# Patient Record
Sex: Male | Born: 1990 | Race: White | Hispanic: No | Marital: Married | Smoking: Never smoker
Health system: Southern US, Community
[De-identification: ages and names within clinical notes are randomized; demographics above are authoritative.]

## PROBLEM LIST (undated history)

## (undated) ENCOUNTER — Emergency Department (HOSPITAL_COMMUNITY)
Discharge status: Against Medical Advice | Payer: Self-pay | Attending: Emergency Medicine | Admitting: Emergency Medicine

## (undated) DIAGNOSIS — Z9889 Other specified postprocedural states: Secondary | ICD-10-CM

## (undated) DIAGNOSIS — Z87442 Personal history of urinary calculi: Secondary | ICD-10-CM

## (undated) DIAGNOSIS — K219 Gastro-esophageal reflux disease without esophagitis: Secondary | ICD-10-CM

## (undated) DIAGNOSIS — N200 Calculus of kidney: Secondary | ICD-10-CM

## (undated) HISTORY — PX: TYMPANOSTOMY TUBE PLACEMENT: SHX32

## (undated) HISTORY — PX: ADENOIDECTOMY: SUR15

## (undated) HISTORY — PX: ANTERIOR CRUCIATE LIGAMENT REPAIR: SHX115

---
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
## 2010-03-22 NOTE — Letter (Signed)
Summary: *Orthopedic No Show Letter  [facility]  [address]  [place], [place] [address]   Phone: [phone]  Fax: [phone]      06/28/2009   Chad Weaver c/o [address] [place], [place]  [address]     Dear Chad Weaver,   Our records indicate that you missed your scheduled appointment with Dr. Beaulah Corin on Monday, 06/26/09.  Please contact this office to reschedule your appointment as soon as possible.  It is important that you keep your scheduled appointments with your physician, so we can provide you the best care possible.        Sincerely,    Dr. Terrance Mass, MD [facility] Phone [phone]
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
## 2010-11-12 LAB — STREP A DNA PROBE

## 2010-11-12 LAB — INFLUENZA A+B VIRUS AG-DIRECT(RAPID): Influenza B Ag: NEGATIVE

## 2010-11-28 LAB — HEMOGLOBIN AND HEMATOCRIT, BLOOD: Hemoglobin: 15.7 — ABNORMAL HIGH

## 2011-01-31 ENCOUNTER — Emergency Department (HOSPITAL_COMMUNITY)
Admission: EM | Admit: 2011-01-31 | Discharge: 2011-01-31 | Disposition: A | Discharge status: Home / Self Care | Payer: Self-pay | Attending: Emergency Medicine | Admitting: Emergency Medicine

## 2011-01-31 ENCOUNTER — Encounter: Payer: Self-pay | Admitting: Emergency Medicine

## 2011-01-31 DIAGNOSIS — M109 Gout, unspecified: Secondary | ICD-10-CM | POA: Insufficient documentation

## 2011-01-31 MED ORDER — PREDNISONE 20 MG PO TABS
60.0000 mg | ORAL_TABLET | Freq: Once | ORAL | Status: AC
  Administered 2011-01-31: 60 mg via ORAL
  Filled 2011-01-31: qty 3

## 2011-01-31 MED ORDER — PREDNISONE 50 MG PO TABS: ORAL_TABLET | ORAL | Status: AC

## 2011-01-31 MED ORDER — HYDROCODONE-ACETAMINOPHEN 5-325 MG PO TABS: ORAL_TABLET | ORAL | Status: DC

## 2011-01-31 MED ORDER — HYDROCODONE-ACETAMINOPHEN 5-325 MG PO TABS
1.0000 | ORAL_TABLET | Freq: Once | ORAL | Status: AC
  Administered 2011-01-31: 1 via ORAL
  Filled 2011-01-31: qty 1

## 2011-01-31 NOTE — ED Notes (Signed)
Pt c/o left foot pain/swelling. H/s gout.

## 2011-01-31 NOTE — ED Provider Notes (Signed)
History     CSN: [id number] Arrival date & time: 01/31/2011 12:10 PM   First MD Initiated Contact with Patient 01/31/11 1422      Chief Complaint  Patient presents with  . Gout    (Consider location/radiation/quality/duration/timing/severity/associated sxs/prior treatment) HPI Comments: Pain in R foot for ~ 4 days.  Has had several episodes of gout previously.    No foot trauma.  No fever.  The history is provided by the patient. No language interpreter was used.    Past Medical History  Diagnosis Date  . Gout     Past Surgical History  Procedure Date  . Anterior cruciate ligament repair     No family history on file.  History  Substance Use Topics  . Smoking status: Never Smoker   . Smokeless tobacco: Not on file  . Alcohol Use: No      Review of Systems  Constitutional: Negative for fever.  Musculoskeletal:       Foot pain  All other systems reviewed and are negative.    Allergies  Sulfonamide derivatives  Home Medications  No current outpatient prescriptions on file.  BP 142/83  Pulse 87  Temp(Src) 97.6 F (36.4 C) (Oral)  Resp 20  Ht 5\' 10"  (1.778 m)  Wt 240 lb (108.863 kg)  BMI 34.44 kg/m2  SpO2 100%  Physical Exam  Nursing note and vitals reviewed. Constitutional: He is oriented to person, place, and time. He appears well-developed and well-nourished.  HENT:  Head: Normocephalic and atraumatic.  Eyes: EOM are normal.  Neck: Normal range of motion.  Cardiovascular: Normal rate, regular rhythm, normal heart sounds and intact distal pulses.   Pulmonary/Chest: Effort normal and breath sounds normal. No respiratory distress.  Abdominal: Soft. He exhibits no distension. There is no tenderness.  Musculoskeletal: He exhibits tenderness.       Right foot: He exhibits decreased range of motion, tenderness and swelling. He exhibits no bony tenderness, normal capillary refill, no crepitus, no deformity and no laceration.        Feet:  Neurological: He is alert and oriented to person, place, and time.  Skin: Skin is warm and dry.  Psychiatric: He has a normal mood and affect. Judgment normal.    ED Course  Procedures (including critical care time)  Labs Reviewed - No data to display No results found.   No diagnosis found.    MDM          Worthy Rancher, PA 01/31/11 1435

## 2011-01-31 NOTE — ED Provider Notes (Signed)
Medical screening examination/treatment/procedure(s) were performed by non-physician practitioner and as supervising physician I was immediately available for consultation/collaboration.   Benny Lennert, MD 01/31/11 [phone]

## 2011-07-06 ENCOUNTER — Encounter (HOSPITAL_COMMUNITY): Payer: Self-pay | Admitting: Emergency Medicine

## 2011-07-06 ENCOUNTER — Emergency Department (HOSPITAL_COMMUNITY): Payer: Self-pay

## 2011-07-06 ENCOUNTER — Emergency Department (HOSPITAL_COMMUNITY)
Admission: EM | Admit: 2011-07-06 | Discharge: 2011-07-06 | Disposition: A | Discharge status: Home / Self Care | Payer: Self-pay | Attending: Emergency Medicine | Admitting: Emergency Medicine

## 2011-07-06 DIAGNOSIS — Z862 Personal history of diseases of the blood and blood-forming organs and certain disorders involving the immune mechanism: Secondary | ICD-10-CM | POA: Insufficient documentation

## 2011-07-06 DIAGNOSIS — N200 Calculus of kidney: Secondary | ICD-10-CM

## 2011-07-06 DIAGNOSIS — R1032 Left lower quadrant pain: Secondary | ICD-10-CM | POA: Insufficient documentation

## 2011-07-06 DIAGNOSIS — N201 Calculus of ureter: Secondary | ICD-10-CM | POA: Insufficient documentation

## 2011-07-06 DIAGNOSIS — R112 Nausea with vomiting, unspecified: Secondary | ICD-10-CM | POA: Insufficient documentation

## 2011-07-06 DIAGNOSIS — N1330 Unspecified hydronephrosis: Secondary | ICD-10-CM | POA: Insufficient documentation

## 2011-07-06 DIAGNOSIS — Z79899 Other long term (current) drug therapy: Secondary | ICD-10-CM | POA: Insufficient documentation

## 2011-07-06 DIAGNOSIS — R61 Generalized hyperhidrosis: Secondary | ICD-10-CM | POA: Insufficient documentation

## 2011-07-06 DIAGNOSIS — Z8639 Personal history of other endocrine, nutritional and metabolic disease: Secondary | ICD-10-CM | POA: Insufficient documentation

## 2011-07-06 LAB — URINALYSIS, ROUTINE W REFLEX MICROSCOPIC
Ketones, ur: NEGATIVE mg/dL
Leukocytes, UA: NEGATIVE
Nitrite: NEGATIVE
Protein, ur: NEGATIVE mg/dL
Urobilinogen, UA: 0.2 mg/dL (ref 0.0–1.0)
pH: 5.5 (ref 5.0–8.0)

## 2011-07-06 LAB — BASIC METABOLIC PANEL
BUN: 16 mg/dL (ref 6–23)
CO2: 25 mEq/L (ref 19–32)
Calcium: 9.1 mg/dL (ref 8.4–10.5)
Creatinine, Ser: 1.05 mg/dL (ref 0.50–1.35)
GFR calc Af Amer: >90 mL/min (ref >90)
GFR calc non Af Amer: >90 mL/min (ref >90)

## 2011-07-06 MED ORDER — OXYCODONE-ACETAMINOPHEN 5-325 MG PO TABS: 1.0000 | ORAL_TABLET | ORAL | Status: AC | PRN

## 2011-07-06 MED ORDER — HYDROMORPHONE HCL PF 1 MG/ML IJ SOLN
1.0000 mg | Freq: Once | INTRAMUSCULAR | Status: AC
  Administered 2011-07-06: 1 mg via INTRAVENOUS

## 2011-07-06 MED ORDER — KETOROLAC TROMETHAMINE 30 MG/ML IJ SOLN
30.0000 mg | Freq: Once | INTRAMUSCULAR | Status: AC
  Administered 2011-07-06: 30 mg via INTRAVENOUS
  Filled 2011-07-06: qty 1

## 2011-07-06 MED ORDER — HYDROMORPHONE HCL PF 1 MG/ML IJ SOLN
1.0000 mg | Freq: Once | INTRAMUSCULAR | Status: DC
  Filled 2011-07-06: qty 1

## 2011-07-06 MED ORDER — PROMETHAZINE HCL 25 MG PO TABS: 25.0000 mg | ORAL_TABLET | Freq: Four times a day (QID) | ORAL | Status: DC | PRN

## 2011-07-06 MED ORDER — ONDANSETRON HCL 4 MG/2ML IJ SOLN
4.0000 mg | Freq: Once | INTRAMUSCULAR | Status: AC
  Administered 2011-07-06: 4 mg via INTRAVENOUS
  Filled 2011-07-06: qty 2

## 2011-07-06 MED ORDER — CEPHALEXIN 500 MG PO CAPS: 500.0000 mg | ORAL_CAPSULE | Freq: Four times a day (QID) | ORAL | Status: AC

## 2011-07-06 NOTE — ED Notes (Signed)
Pt returned from xray,  

## 2011-07-06 NOTE — Discharge Instructions (Signed)
Kidney Stones Kidney stones (ureteral lithiasis) are deposits that form inside your kidneys. The intense pain is caused by the stone moving through the urinary tract. When the stone moves, the ureter goes into spasm around the stone. The stone is usually passed in the urine.  CAUSES   A disorder that makes certain neck glands produce too much parathyroid hormone (primary hyperparathyroidism).   A buildup of uric acid crystals.   Narrowing (stricture) of the ureter.   A kidney obstruction present at birth (congenital obstruction).   Previous surgery on the kidney or ureters.   Numerous kidney infections.  SYMPTOMS   Feeling sick to your stomach (nauseous).   Throwing up (vomiting).   Blood in the urine (hematuria).   Pain that usually spreads (radiates) to the groin.   Frequency or urgency of urination.  DIAGNOSIS   Taking a history and physical exam.   Blood or urine tests.   Computerized X-ray scan (CT scan).   Occasionally, an examination of the inside of the urinary bladder (cystoscopy) is performed.  TREATMENT   Observation.   Increasing your fluid intake.   Surgery may be needed if you have severe pain or persistent obstruction.  The size, location, and chemical composition are all important variables that will determine the proper choice of action for you. Talk to your caregiver to better understand your situation so that you will minimize the risk of injury to yourself and your kidney.  HOME CARE INSTRUCTIONS   Drink enough water and fluids to keep your urine clear or pale yellow.   Strain all urine through the provided strainer. Keep all particulate matter and stones for your caregiver to see. The stone causing the pain may be as small as a grain of salt. It is very important to use the strainer each and every time you pass your urine. The collection of your stone will allow your caregiver to analyze it and verify that a stone has actually passed.   Only take  over-the-counter or prescription medicines for pain, discomfort, or fever as directed by your caregiver.   Make a follow-up appointment with your caregiver as directed.   Get follow-up X-rays if required. The absence of pain does not always mean that the stone has passed. It may have only stopped moving. If the urine remains completely obstructed, it can cause loss of kidney function or even complete destruction of the kidney. It is your responsibility to make sure X-rays and follow-ups are completed. Ultrasounds of the kidney can show blockages and the status of the kidney. Ultrasounds are not associated with any radiation and can be performed easily in a matter of minutes.  SEEK IMMEDIATE MEDICAL CARE IF:   Pain cannot be controlled with the prescribed medicine.   You have a fever.   The severity or intensity of pain increases over 18 hours and is not relieved by pain medicine.   You develop a new onset of abdominal pain.   You feel faint or pass out.  MAKE SURE YOU:   Understand these instructions.   Will watch your condition.   Will get help right away if you are not doing well or get worse.  Document Released: 02/04/2005 Document Revised: 01/24/2011 Document Reviewed: 06/02/2009 [facility] Patient Information 2012 [place], [place].   You may take the oxycodone prescribed for pain relief.  This will make you drowsy - do not drive within 4 hours of taking this medication. Return here for immediate evaluation if you develop uncontrolled vomiting,  or if you develop fevers.

## 2011-07-06 NOTE — ED Provider Notes (Signed)
History     CSN: [id number]  Arrival date & time 07/06/11  0802   First MD Initiated Contact with Patient 07/06/11 0809      Chief Complaint  Patient presents with  . Abdominal Pain  . Back Pain  . Emesis    (Consider location/radiation/quality/duration/timing/severity/associated sxs/prior treatment) HPI Comments: DEVONE TOUSLEY woke early this morning with left-sided flank pain which radiates into his left lower abdomen.  He has had nausea with emesis when he attempted to drink water upon awaking.  He reports feeling like he needs to have a bowel movement, although denies history of constipation or diarrhea.  His last bowel movement was yesterday.  He has also had frequent episodes of urination this morning, but only small amounts of urine passage.  He denies hematuria, also denies fevers or chills.  He has been diaphoretic which is increased when his pain becomes severe.  Pain is been constant and sharp with intermittent episodes of worse pain.  Spine no alleviating or.  He is taking no medications this morning.  He does have a history of gout, no personal or family history of kidney stones.  The history is provided by the patient.    Past Medical History  Diagnosis Date  . Gout     Past Surgical History  Procedure Date  . Anterior cruciate ligament repair     History reviewed. No pertinent family history.  History  Substance Use Topics  . Smoking status: Never Smoker   . Smokeless tobacco: Not on file  . Alcohol Use: No      Review of Systems  Constitutional: Negative for fever.  HENT: Negative for congestion, sore throat and neck pain.   Eyes: Negative.   Respiratory: Negative for chest tightness and shortness of breath.   Cardiovascular: Negative for chest pain.  Gastrointestinal: Positive for nausea, vomiting and abdominal pain.  Genitourinary: Negative.   Musculoskeletal: Negative for joint swelling and arthralgias.  Skin: Negative.  Negative for rash and  wound.  Neurological: Negative for dizziness, weakness, light-headedness, numbness and headaches.  Hematological: Negative.   Psychiatric/Behavioral: Negative.     Allergies  Sulfonamide derivatives  Home Medications   Current Outpatient Rx  Name Route Sig Dispense Refill  . ALLOPURINOL 100 MG PO TABS Oral Take 100 mg by mouth 2 (two) times daily.     . OXYCODONE-ACETAMINOPHEN 5-325 MG PO TABS Oral Take 1 tablet by mouth every 4 (four) hours as needed for pain. 20 tablet 0    BP 147/92  Pulse 96  Temp(Src) 97.6 F (36.4 C) (Oral)  Resp 22  Ht 5\' 11"  (1.803 m)  Wt 260 lb (117.935 kg)  BMI 36.26 kg/m2  SpO2 95%  Physical Exam  Nursing note and vitals reviewed. Constitutional: He appears well-developed and well-nourished.  HENT:  Head: Normocephalic and atraumatic.  Eyes: Conjunctivae are normal.  Neck: Normal range of motion.  Cardiovascular: Normal rate, regular rhythm, normal heart sounds and intact distal pulses.   Pulmonary/Chest: Effort normal and breath sounds normal. He has no wheezes.  Abdominal: Soft. Bowel sounds are normal. There is no hepatosplenomegaly. There is tenderness in the left lower quadrant. There is CVA tenderness. There is no rebound and no guarding.       Patient does have pain in his left lower quadrant which he describes as crampy, it is not worsened with palpation.  CVA tenderness is on the left.  Musculoskeletal: Normal range of motion.  Neurological: He is alert.  Skin: Skin  is warm and dry.  Psychiatric: He has a normal mood and affect.    ED Course  Procedures (including critical care time)  Labs Reviewed  URINALYSIS, ROUTINE W REFLEX MICROSCOPIC - Abnormal; Notable for the following:    Specific Gravity, Urine >1.030 (*)    Hgb urine dipstick LARGE (*)    All other components within normal limits  BASIC METABOLIC PANEL - Abnormal; Notable for the following:    Potassium 3.2 (*)    Glucose, Bld 121 (*)    All other components within  normal limits  URINE MICROSCOPIC-ADD ON - Abnormal; Notable for the following:    Bacteria, UA FEW (*)    Crystals CA OXALATE CRYSTALS (*)    All other components within normal limits   Ct Abdomen Pelvis Wo Contrast  07/06/2011  *RADIOLOGY REPORT*  Clinical Data: 21 year old male with left-sided flank, abdominal and back pain.  CT ABDOMEN AND PELVIS WITHOUT CONTRAST  Technique:  Multidetector CT imaging of the abdomen and pelvis was performed following the standard protocol without intravenous contrast.  Comparison: None  Findings: A 1.5 mm calculus at the left UVJ is identified causing mild left hydroureteronephrosis. A punctate nonobstructing calculus within the mid right kidney is noted. The liver, spleen, pancreas, gallbladder and adrenal glands are unremarkable. Please note that parenchymal abnormalities may be missed as intravenous contrast was not administered.  No free fluid, enlarged lymph nodes, biliary dilation or abdominal aortic aneurysm identified.  The bowel, appendix and bladder are unremarkable. No acute or suspicious bony abnormalities are present.  IMPRESSION: 1.5 mm left UVJ calculus causing mild left hydroureteronephrosis.  Punctate nonobstructing right renal calculus.  Original Report Authenticated By: Rosendo Gros, M.D.     1. Kidney stones       MDM  Patient given urine strainer in order to catch the stone if it passes.  He was prescribed Keflex, Percocet and Phenergan for symptom relief.  Encouraged to increase fluid intake, patient to call Dr. Jerre Simon on Monday for further management of his new diagnosis of kidney stones.  The patient was symptom free at time of discharge.  Instructions given to return here if he develops uncontrolled vomiting or fevers.    Burgess Amor, PA 07/06/11 1010

## 2011-07-06 NOTE — ED Notes (Signed)
Pt c/o left flank and left lower abd pain that started suddenly this am, pain is associated with n/v, pt states that it feels like he can not empty his bladder or have a bowel movement, pt urinating in small amounts at a time, denies any pain or burning with urination, pt last bowel movement was yesterday and was normal per pt.

## 2011-07-06 NOTE — ED Notes (Signed)
Pt c/o abd pain with back pain since this am. Pt states when he drinks he vomits and states he feels like he needs to have a bm.

## 2011-07-06 NOTE — ED Notes (Signed)
MD at bedside. 

## 2011-07-07 NOTE — ED Provider Notes (Signed)
Medical screening examination/treatment/procedure(s) were performed by non-physician practitioner and as supervising physician I was immediately available for consultation/collaboration.  Shelda Jakes, MD 07/07/11 [phone]

## 2012-02-07 ENCOUNTER — Encounter (HOSPITAL_COMMUNITY): Payer: Self-pay | Admitting: *Deleted

## 2012-02-07 ENCOUNTER — Emergency Department (HOSPITAL_COMMUNITY)
Admission: EM | Admit: 2012-02-07 | Discharge: 2012-02-07 | Disposition: A | Discharge status: Home / Self Care | Payer: Self-pay | Attending: Emergency Medicine | Admitting: Emergency Medicine

## 2012-02-07 DIAGNOSIS — M109 Gout, unspecified: Secondary | ICD-10-CM | POA: Insufficient documentation

## 2012-02-07 DIAGNOSIS — Z9889 Other specified postprocedural states: Secondary | ICD-10-CM | POA: Insufficient documentation

## 2012-02-07 DIAGNOSIS — Z87442 Personal history of urinary calculi: Secondary | ICD-10-CM | POA: Insufficient documentation

## 2012-02-07 HISTORY — DX: Calculus of kidney: N20.0

## 2012-02-07 HISTORY — DX: Other specified postprocedural states: Z98.890

## 2012-02-07 MED ORDER — OXYCODONE-ACETAMINOPHEN 5-325 MG PO TABS: 2.0000 | ORAL_TABLET | ORAL | Status: DC | PRN

## 2012-02-07 MED ORDER — OXYCODONE-ACETAMINOPHEN 5-325 MG PO TABS
2.0000 | ORAL_TABLET | Freq: Once | ORAL | Status: AC
  Administered 2012-02-07: 2 via ORAL
  Filled 2012-02-07: qty 2

## 2012-02-07 MED ORDER — KETOROLAC TROMETHAMINE 60 MG/2ML IM SOLN
60.0000 mg | Freq: Once | INTRAMUSCULAR | Status: AC
  Administered 2012-02-07: 60 mg via INTRAMUSCULAR
  Filled 2012-02-07: qty 2

## 2012-02-07 MED ORDER — INDOMETHACIN 25 MG PO CAPS: 25.0000 mg | ORAL_CAPSULE | Freq: Three times a day (TID) | ORAL | Status: DC | PRN

## 2012-02-07 NOTE — ED Provider Notes (Signed)
Medical screening examination/treatment/procedure(s) were performed by non-physician practitioner and as supervising physician I was immediately available for consultation/collaboration.  Lanelle Lindo, MD 02/07/12 2341 

## 2012-02-07 NOTE — ED Notes (Signed)
Pt states pain to entire right foot, stating gout pain. X 2 days.

## 2012-02-07 NOTE — ED Provider Notes (Signed)
History     CSN: [id number]  Arrival date & time 02/07/12  1409   None     Chief Complaint  Patient presents with  . Foot Pain    HPI Chad Weaver is a 21 y.o. male who presents to the ED with foot pain. The pain is located in the right foot. The pain started 2 days ago. The patient describes the pain as a constant sharp pain. The pain cover the entire foot and radiates to the ankle. No known injury. Has had this pain before with gout. Dx with gout at age 28 and has had 4 or 5 episodes since then. Has taken medication in the past but when called office this time they told him he needed a regular doctor rather than a pediatrician. He rates the pain as 10/10. The history was provided by the patient.  Past Medical History  Diagnosis Date  . Gout   . Kidney stones   . H/O knee surgery     Past Surgical History  Procedure Date  . Anterior cruciate ligament repair     No family history on file.  History  Substance Use Topics  . Smoking status: Never Smoker   . Smokeless tobacco: Not on file  . Alcohol Use: No      Review of Systems  Allergies  Sulfonamide derivatives  Home Medications   Current Outpatient Rx  Name  Route  Sig  Dispense  Refill  . IBUPROFEN 200 MG PO TABS   Oral   Take 800 mg by mouth daily as needed. For pain           BP 142/83  Pulse 99  Temp 98.1 F (36.7 C) (Oral)  Resp 16  SpO2 100%  Physical Exam  Constitutional: He is oriented to person, place, and time. He appears well-developed and well-nourished. No distress.  HENT:  Head: Normocephalic.  Neck: Neck supple.  Cardiovascular: Normal rate.   Pulmonary/Chest: Effort normal.  Musculoskeletal:       Right foot with tenderness on palpation. Increased pain in the right great toe. Pain radiates to the right ankle. Pedal pulse strong.  Pain with range of motion of foot and ankle.  Neurological: He is alert and oriented to person, place, and time.  Skin: Skin is warm and dry.   Psychiatric: He has a normal mood and affect. Judgment and thought content normal.   Assessment: 21 y.o. male with right foot pain   Gout  Plan:  Indocin Rx   Percocet Rx   Follow up with Dr. Sherwood Gambler, return as needed Discussed with the patient and all questioned fully answered. He will return if any problems arise.   Medication List     As of 02/07/2012  3:49 PM    START taking these medications         indomethacin 25 MG capsule   Commonly known as: INDOCIN   Take 1 capsule (25 mg total) by mouth 3 (three) times daily as needed.      oxyCODONE-acetaminophen 5-325 MG per tablet   Commonly known as: PERCOCET/ROXICET   Take 2 tablets by mouth every 4 (four) hours as needed for pain.      ASK your doctor about these medications         ibuprofen 200 MG tablet   Commonly known as: ADVIL,MOTRIN          Where to get your medications    These are the prescriptions that you need  to pick up.   You may get these medications from any pharmacy.         indomethacin 25 MG capsule   oxyCODONE-acetaminophen 5-325 MG per tablet            Procedures     Janne Napoleon, NP 02/07/12 1549

## 2012-02-07 NOTE — ED Notes (Signed)
Pain rt foot for 2 days. Hx of gout.

## 2012-05-19 ENCOUNTER — Encounter (HOSPITAL_COMMUNITY): Payer: Self-pay | Admitting: Emergency Medicine

## 2012-05-19 ENCOUNTER — Emergency Department (HOSPITAL_COMMUNITY)
Admission: EM | Admit: 2012-05-19 | Discharge: 2012-05-19 | Disposition: A | Discharge status: Home / Self Care | Payer: Self-pay | Attending: Emergency Medicine | Admitting: Emergency Medicine

## 2012-05-19 DIAGNOSIS — G8929 Other chronic pain: Secondary | ICD-10-CM | POA: Insufficient documentation

## 2012-05-19 DIAGNOSIS — Z79899 Other long term (current) drug therapy: Secondary | ICD-10-CM | POA: Insufficient documentation

## 2012-05-19 DIAGNOSIS — M25569 Pain in unspecified knee: Secondary | ICD-10-CM | POA: Insufficient documentation

## 2012-05-19 DIAGNOSIS — M779 Enthesopathy, unspecified: Secondary | ICD-10-CM

## 2012-05-19 DIAGNOSIS — Z9889 Other specified postprocedural states: Secondary | ICD-10-CM | POA: Insufficient documentation

## 2012-05-19 DIAGNOSIS — M65839 Other synovitis and tenosynovitis, unspecified forearm: Secondary | ICD-10-CM | POA: Insufficient documentation

## 2012-05-19 DIAGNOSIS — M109 Gout, unspecified: Secondary | ICD-10-CM | POA: Insufficient documentation

## 2012-05-19 DIAGNOSIS — Z87442 Personal history of urinary calculi: Secondary | ICD-10-CM | POA: Insufficient documentation

## 2012-05-19 MED ORDER — PREDNISONE 20 MG PO TABS: ORAL_TABLET | ORAL | Status: DC

## 2012-05-19 MED ORDER — OXYCODONE-ACETAMINOPHEN 5-325 MG PO TABS
1.0000 | ORAL_TABLET | Freq: Once | ORAL | Status: AC
  Administered 2012-05-19: 1 via ORAL
  Filled 2012-05-19: qty 1

## 2012-05-19 MED ORDER — OXYCODONE-ACETAMINOPHEN 5-325 MG PO TABS: 1.0000 | ORAL_TABLET | ORAL | Status: DC | PRN

## 2012-05-19 NOTE — ED Notes (Signed)
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 Pt states has had 2 knee surgeries in the past. Pt educated on the effects of joint damage secondary to obesity. Pt denies injury to affected limbs. nAD noted

## 2012-05-19 NOTE — ED Notes (Signed)
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 Right hand is red and swollen at triage. Also complaining of chronic left knee pain.

## 2012-05-19 NOTE — ED Provider Notes (Signed)
History     CSN: [id number]  Arrival date & time 05/19/12  4098   First MD Initiated Contact with Patient 05/19/12 2027      Chief Complaint  Patient presents with  . Knee Pain  . Hand Pain    (Consider location/radiation/quality/duration/timing/severity/associated sxs/prior treatment) HPI Comments: Patient c/o right hand pain for 4 days that began gradually.  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  He denies known injury.  Describes a sharp pain to the right hand and wrist , occasionally radiates into his forearm.  He denies neck pain, redness, fever or chills.  Pt is right hand dominant.  Pt also c/o persistent pain to his left knee that he has sen an orthopedist about in the past.    Patient is a 22 y.o. male presenting with hand pain.  Hand Pain This is a new problem. The current episode started in the past 7 days. The problem occurs constantly. The problem has been unchanged. Associated symptoms include arthralgias. Pertinent negatives include no chest pain, chills, fever, headaches, joint swelling, nausea, neck pain, numbness, rash, visual change, vomiting or weakness. Exacerbated by: palpation and movment. He has tried NSAIDs for the symptoms. The treatment provided mild relief.    Past Medical History  Diagnosis Date  . Gout   . Kidney stones   . H/O knee surgery     Past Surgical History  Procedure Laterality Date  . Anterior cruciate ligament repair      History reviewed. No pertinent family history.  History  Substance Use Topics  . Smoking status: Never Smoker   . Smokeless tobacco: Not on file  . Alcohol Use: No      Review of Systems  Constitutional: Negative for fever and chills.  HENT: Negative for neck pain.   Respiratory: Negative for shortness of breath.   Cardiovascular: Negative for chest pain.  Gastrointestinal: Negative for nausea and vomiting.  Genitourinary: Negative for dysuria and difficulty  urinating.  Musculoskeletal: Positive for arthralgias. Negative for back pain, joint swelling and gait problem.  Skin: Negative for color change, rash and wound.  Neurological: Negative for dizziness, facial asymmetry, weakness, numbness and headaches.  All other systems reviewed and are negative.    Allergies  Sulfonamide derivatives  Home Medications   Current Outpatient Rx  Name  Route  Sig  Dispense  Refill  . allopurinol (ZYLOPRIM) 300 MG tablet   Oral   Take 300 mg by mouth daily.         . naproxen (NAPROSYN) 500 MG tablet   Oral   Take 500 mg by mouth 2 (two) times daily.           BP 142/88  Pulse 108  Temp(Src) 98.1 F (36.7 C) (Oral)  Resp 20  Ht 5\' 7"  (1.702 m)  Wt 300 lb (136.079 kg)  BMI 46.98 kg/m2  SpO2 97%  Physical Exam  Nursing note and vitals reviewed. Constitutional: He is oriented to person, place, and time. He appears well-developed and well-nourished. No distress.  HENT:  Head: Normocephalic and atraumatic.  Cardiovascular: Normal rate, regular rhythm, normal heart sounds and intact distal pulses.   Pulmonary/Chest: Effort normal and breath sounds normal.  Musculoskeletal: He exhibits tenderness. He exhibits no edema.       Left knee: He exhibits normal range of motion, no swelling, no effusion, no deformity, no laceration and no erythema. Tenderness found. Medial joint line and  lateral joint line tenderness noted. No patellar tendon tenderness noted.       Legs: right wrist and dorsal right hand is ttp  Radial pulse is brisk, distal sensation intact.  CR< 2 sec.  No bruising ,erythema, excessive warmth or deformity.  Patient has full ROM.  Diffuse ttp of the anterior left knee.  No step=off deformity or effusion.  No calf pain  Neurological: He is alert and oriented to person, place, and time. He exhibits normal muscle tone. Coordination normal.  Skin: Skin is warm and dry. No erythema.    ED Course  Procedures (including critical care  time)  Labs Reviewed - No data to display No results found.     velcro thumb spica applied, pain improved, remains NV intact  MDM    ttp of the proximal right thumb extending into the wrist.  No trauma, erythema, excessive warmth or edema.  No bony deformity.  No focal neuro deficits .  Sx's are likely related to tendonitis.  Pt agrees to ice, elevate and close f/u with Dr. Romeo Apple  Prescribed percocet and prednisone taper  The patient appears reasonably screened and/or stabilized for discharge and I doubt any other medical condition or other [facility] requiring further screening, evaluation, or treatment in the ED at this time prior to discharge.    Carlyn Mullenbach L. Trisha Mangle, PA-C 05/22/12 2227

## 2012-05-23 NOTE — ED Provider Notes (Signed)
Medical screening examination/treatment/procedure(s) were performed by non-physician practitioner and as supervising physician I was immediately available for consultation/collaboration.   Laray Anger, DO 05/23/12 1348

## 2012-07-31 ENCOUNTER — Encounter (HOSPITAL_COMMUNITY): Payer: Self-pay | Admitting: Emergency Medicine

## 2012-07-31 ENCOUNTER — Emergency Department (HOSPITAL_COMMUNITY): Payer: Self-pay

## 2012-07-31 ENCOUNTER — Emergency Department (HOSPITAL_COMMUNITY)
Admission: EM | Admit: 2012-07-31 | Discharge: 2012-07-31 | Disposition: A | Discharge status: Home / Self Care | Payer: Self-pay | Attending: Emergency Medicine | Admitting: Emergency Medicine

## 2012-07-31 DIAGNOSIS — Z79899 Other long term (current) drug therapy: Secondary | ICD-10-CM | POA: Insufficient documentation

## 2012-07-31 DIAGNOSIS — W240XXA Contact with lifting devices, not elsewhere classified, initial encounter: Secondary | ICD-10-CM | POA: Insufficient documentation

## 2012-07-31 DIAGNOSIS — Y9389 Activity, other specified: Secondary | ICD-10-CM | POA: Insufficient documentation

## 2012-07-31 DIAGNOSIS — Z9889 Other specified postprocedural states: Secondary | ICD-10-CM | POA: Insufficient documentation

## 2012-07-31 DIAGNOSIS — M109 Gout, unspecified: Secondary | ICD-10-CM | POA: Insufficient documentation

## 2012-07-31 DIAGNOSIS — S335XXA Sprain of ligaments of lumbar spine, initial encounter: Secondary | ICD-10-CM | POA: Insufficient documentation

## 2012-07-31 DIAGNOSIS — Y92009 Unspecified place in unspecified non-institutional (private) residence as the place of occurrence of the external cause: Secondary | ICD-10-CM | POA: Insufficient documentation

## 2012-07-31 DIAGNOSIS — Z87442 Personal history of urinary calculi: Secondary | ICD-10-CM | POA: Insufficient documentation

## 2012-07-31 DIAGNOSIS — S39012A Strain of muscle, fascia and tendon of lower back, initial encounter: Secondary | ICD-10-CM

## 2012-07-31 MED ORDER — CYCLOBENZAPRINE HCL 10 MG PO TABS: 10.0000 mg | ORAL_TABLET | Freq: Three times a day (TID) | ORAL | Status: DC | PRN

## 2012-07-31 MED ORDER — IBUPROFEN 800 MG PO TABS: 800.0000 mg | ORAL_TABLET | Freq: Three times a day (TID) | ORAL | Status: DC

## 2012-07-31 MED ORDER — HYDROMORPHONE HCL PF 1 MG/ML IJ SOLN
1.0000 mg | Freq: Once | INTRAMUSCULAR | Status: AC
  Administered 2012-07-31: 1 mg via INTRAMUSCULAR
  Filled 2012-07-31: qty 1

## 2012-07-31 NOTE — ED Provider Notes (Signed)
History    This chart was scribed for Chad Lennert, MD by Chad Weaver, ED Scribe. The patient was seen in room APA08/APA08 and the patient's care was started at 10:24AM.    CSN: [id number]  Arrival date & time 07/31/12  1006   First MD Initiated Contact with Patient 07/31/12 1016      Chief Complaint  Patient presents with  . Back Pain    (Consider location/radiation/quality/duration/timing/severity/associated sxs/prior treatment) Patient is a 22 y.o. male presenting with back pain. The history is provided by the patient. No language interpreter was used.  Back Pain Location:  Lumbar spine (right lumbar) Radiates to:  Does not radiate Pain severity:  Moderate Onset quality:  Sudden Duration:  3 hours Timing:  Constant Progression:  Worsening Chronicity:  New Context: lifting heavy objects   Associated symptoms: no abdominal pain, no chest pain and no headaches    HPI Comments: Chad Weaver is a 22 y.o. 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 He has never hurt his back before. He denies radiation of pain from his back but reports he has some chronic knee pain. No other pertinent medical symptoms.  Past Medical History  Diagnosis Date  . Gout   . Kidney stones   . H/O knee surgery     Past Surgical History  Procedure Laterality Date  . Anterior cruciate ligament repair      No family history on file.  History  Substance Use Topics  . Smoking status: Never Smoker   . Smokeless tobacco: Not on file  . Alcohol Use: No      Review of Systems  Constitutional: Negative for appetite change and fatigue.  HENT: Negative for congestion, sinus pressure and ear discharge.   Eyes: Negative for discharge.  Respiratory: Negative for cough.   Cardiovascular: Negative for chest pain.   Gastrointestinal: Negative for abdominal pain and diarrhea.  Genitourinary: Negative for frequency and hematuria.  Musculoskeletal: Positive for back pain.  Skin: Negative for rash.  Neurological: Negative for seizures and headaches.  Psychiatric/Behavioral: Negative for hallucinations.  All other systems reviewed and are negative.    Allergies  Sulfonamide derivatives  Home Medications   Current Outpatient Rx  Name  Route  Sig  Dispense  Refill  . allopurinol (ZYLOPRIM) 300 MG tablet   Oral   Take 300 mg by mouth daily.           BP 143/83  Pulse 99  Temp(Src) 97.7 F (36.5 C)  Resp 20  Ht 5\' 11"  (1.803 m)  Wt 300 lb (136.079 kg)  BMI 41.86 kg/m2  SpO2 95%  Physical Exam  Nursing note and vitals reviewed. Constitutional: He is oriented to person, place, and time. He appears well-developed.  HENT:  Head: Normocephalic.  Eyes: Conjunctivae and EOM are normal. No scleral icterus.  Neck: Neck supple. No thyromegaly present.  Cardiovascular: Normal rate and regular rhythm.  Exam reveals no gallop and no friction rub.   No murmur heard. Pulmonary/Chest: Effort normal. No stridor. He has no wheezes. He has no rales. He exhibits no tenderness.  Abdominal: He exhibits no distension. There is no tenderness. There is no rebound.  Musculoskeletal: Normal range of motion. He exhibits tenderness. He exhibits no edema.  Tender to the right lumbar paraspinal muscles  Lymphadenopathy:  He has no cervical adenopathy.  Neurological: He is oriented to person, place, and time. Coordination normal.  Skin: No rash noted. No erythema.  Psychiatric: He has a normal mood and affect. His behavior is normal.    ED Course  Procedures (including critical care time)  COORDINATION OF CARE:  10:27AM - dilaudid and lumbar spine XR will be ordered for Chad Weaver.   11:30AM - imaging results reviewed  12:13PM - recheck; pt's condition has improved and he is stable. He is ready  for d/c.   Labs Reviewed - No data to display Dg Lumbar Spine Complete  07/31/2012   *RADIOLOGY REPORT*  Clinical Data: Nonradiating low back pain after lifting injury this morning  LUMBAR SPINE - COMPLETE 4+ VIEW  Comparison: None Correlation:  CT abdomen pelvis 07/06/2011  Findings: Five non-rib bearing lumbar vertebrae. Osseous mineralization grossly normal. Scattered Schmorl's nodes. Mild anterior height losses at T11 and T12 appear grossly unchanged from prior CT. No definite acute fracture, subluxation, or bone destruction. No spondylolysis. SI joints symmetric.  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 No definite acute bony abnormalities.   Original Report Authenticated By: Ulyses Southward, M.D.     No diagnosis found.    MDM     The chart was scribed for me under my direct supervision.  I personally performed the history, physical, and medical decision making and all procedures in the evaluation of this patient.Chad Lennert, MD 07/31/12 [phone]

## 2012-07-31 NOTE — ED Notes (Signed)
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

## 2013-03-19 ENCOUNTER — Encounter (HOSPITAL_COMMUNITY): Payer: Self-pay | Admitting: Emergency Medicine

## 2013-03-19 ENCOUNTER — Emergency Department (HOSPITAL_COMMUNITY)
Admission: EM | Admit: 2013-03-19 | Discharge: 2013-03-19 | Disposition: A | Discharge status: Home / Self Care | Payer: Self-pay | Attending: Emergency Medicine | Admitting: Emergency Medicine

## 2013-03-19 DIAGNOSIS — M79606 Pain in leg, unspecified: Secondary | ICD-10-CM

## 2013-03-19 DIAGNOSIS — M79609 Pain in unspecified limb: Secondary | ICD-10-CM | POA: Insufficient documentation

## 2013-03-19 DIAGNOSIS — Z791 Long term (current) use of non-steroidal anti-inflammatories (NSAID): Secondary | ICD-10-CM | POA: Insufficient documentation

## 2013-03-19 DIAGNOSIS — M109 Gout, unspecified: Secondary | ICD-10-CM | POA: Insufficient documentation

## 2013-03-19 DIAGNOSIS — Z87442 Personal history of urinary calculi: Secondary | ICD-10-CM | POA: Insufficient documentation

## 2013-03-19 DIAGNOSIS — G8911 Acute pain due to trauma: Secondary | ICD-10-CM | POA: Insufficient documentation

## 2013-03-19 DIAGNOSIS — Z87828 Personal history of other (healed) physical injury and trauma: Secondary | ICD-10-CM | POA: Insufficient documentation

## 2013-03-19 MED ORDER — OXYCODONE-ACETAMINOPHEN 5-325 MG PO TABS
1.0000 | ORAL_TABLET | Freq: Once | ORAL | Status: AC
  Administered 2013-03-19: 1 via ORAL
  Filled 2013-03-19: qty 1

## 2013-03-19 MED ORDER — OXYCODONE-ACETAMINOPHEN 5-325 MG PO TABS: 1.0000 | ORAL_TABLET | Freq: Four times a day (QID) | ORAL | Status: DC | PRN

## 2013-03-19 NOTE — Discharge Instructions (Signed)
Follow up as planned next week
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
## 2013-05-09 ENCOUNTER — Emergency Department (HOSPITAL_COMMUNITY)
Admission: EM | Admit: 2013-05-09 | Discharge: 2013-05-09 | Disposition: A | Discharge status: Home / Self Care | Payer: Self-pay | Attending: Emergency Medicine | Admitting: Emergency Medicine

## 2013-05-09 ENCOUNTER — Encounter (HOSPITAL_COMMUNITY): Payer: Self-pay | Admitting: Emergency Medicine

## 2013-05-09 DIAGNOSIS — M109 Gout, unspecified: Secondary | ICD-10-CM | POA: Insufficient documentation

## 2013-05-09 DIAGNOSIS — R000 Tachycardia, unspecified: Secondary | ICD-10-CM | POA: Insufficient documentation

## 2013-05-09 DIAGNOSIS — Z87442 Personal history of urinary calculi: Secondary | ICD-10-CM | POA: Insufficient documentation

## 2013-05-09 DIAGNOSIS — Z792 Long term (current) use of antibiotics: Secondary | ICD-10-CM | POA: Insufficient documentation

## 2013-05-09 MED ORDER — PREDNISONE 10 MG PO TABS: ORAL_TABLET | ORAL | Status: DC

## 2013-05-09 MED ORDER — MELOXICAM 7.5 MG PO TABS: ORAL_TABLET | ORAL | Status: DC

## 2013-05-09 MED ORDER — HYDROCODONE-ACETAMINOPHEN 5-325 MG PO TABS
1.0000 | ORAL_TABLET | Freq: Four times a day (QID) | ORAL | Status: DC | PRN
Start: 2013-05-09 — End: 2013-10-26

## 2013-05-09 NOTE — ED Provider Notes (Signed)
CSN: [id number]     Arrival date & time 05/09/13  1531 History   First MD Initiated Contact with Patient 05/09/13 1617     Chief Complaint  Patient presents with  . Foot Pain     (Consider location/radiation/quality/duration/timing/severity/associated sxs/prior Treatment) Patient is a 23 y.o. male presenting with lower extremity pain. The history is provided by the patient.  Foot Pain This is a recurrent problem. The current episode started yesterday. The problem occurs constantly. Associated symptoms include arthralgias. Pertinent negatives include no abdominal pain, anorexia, chest pain, coughing, fever, neck pain or numbness. The symptoms are aggravated by standing and walking. He has tried nothing for the symptoms. The treatment provided no relief.    Past Medical History  Diagnosis Date  . Gout   . Kidney stones   . H/O knee surgery    Past Surgical History  Procedure Laterality Date  . Anterior cruciate ligament repair     No family history on file. History  Substance Use Topics  . Smoking status: Never Smoker   . Smokeless tobacco: Not on file  . Alcohol Use: No    Review of Systems  Constitutional: Negative for fever and activity change.       All ROS Neg except as noted in HPI  HENT: Negative for nosebleeds.   Eyes: Negative for photophobia and discharge.  Respiratory: Negative for cough, shortness of breath and wheezing.   Cardiovascular: Negative for chest pain and palpitations.  Gastrointestinal: Negative for abdominal pain, blood in stool and anorexia.  Genitourinary: Negative for dysuria, frequency and hematuria.  Musculoskeletal: Positive for arthralgias and gait problem. Negative for back pain and neck pain.  Skin: Negative.   Neurological: Negative for dizziness, seizures, speech difficulty and numbness.  Psychiatric/Behavioral: Negative for hallucinations and confusion.      Allergies  Sulfonamide derivatives  Home Medications   Current  Outpatient Rx  Name  Route  Sig  Dispense  Refill  . cephALEXin (KEFLEX) 500 MG capsule   Oral   Take 500 mg by mouth 3 (three) times daily. Started on 03/14/13 for 7 days         . oxyCODONE-acetaminophen (PERCOCET) 5-325 MG per tablet   Oral   Take 1 tablet by mouth every 6 (six) hours as needed.   20 tablet   0    There were no vitals taken for this visit. Physical Exam  Nursing note and vitals reviewed. Constitutional: He is oriented to person, place, and time. He appears well-developed and well-nourished.  Non-toxic appearance.  HENT:  Head: Normocephalic.  Right Ear: Tympanic membrane and external ear normal.  Left Ear: Tympanic membrane and external ear normal.  Eyes: EOM and lids are normal. Pupils are equal, round, and reactive to light.  Neck: Normal range of motion. Neck supple. Carotid bruit is not present.  Cardiovascular: Regular rhythm, normal heart sounds, intact distal pulses and normal pulses.  Tachycardia present.   Pulmonary/Chest: Breath sounds normal. No respiratory distress.  Abdominal: Soft. Bowel sounds are normal. There is no tenderness. There is no guarding.  Musculoskeletal:       Left foot: He exhibits decreased range of motion and tenderness. He exhibits no deformity.       Feet:  Lymphadenopathy:       Head (right side): No submandibular adenopathy present.       Head (left side): No submandibular adenopathy present.    He has no cervical adenopathy.  Neurological: He is alert and oriented to  person, place, and time. He has normal strength. No cranial nerve deficit or sensory deficit.  Skin: Skin is warm and dry.  Psychiatric: He has a normal mood and affect. His speech is normal.    ED Course  Procedures (including critical care time) Labs Review Labs Reviewed - No data to display Imaging Review No results found.   EKG Interpretation None      MDM Pt has a hx of gout. Uric acid level was elevated about 2 months ago per pt. No  reported injury. No puncture wound on the plantar surface. No lesions between the toes. Suspect inflammatory attack. Plan - Prednisone, mobic, and norco. Pt to follow up with PCP this week.   Final diagnoses:  None    **I have reviewed nursing notes, vital signs, and all appropriate lab and imaging results for this patient.Kathie Dike, PA-C 05/09/13 1639

## 2013-05-09 NOTE — ED Notes (Signed)
Left foot pain , history of gout

## 2013-05-09 NOTE — Discharge Instructions (Signed)
Please call Dr Olena Leatherwood tomorrow for appointment this week. Watch your diet for foods high in uric acid. Use prednisone and mobic daily. Use norco for pain if needed. This medication may cause drowsiness, use with caution. Purine Restricted Diet A low-purine diet consists of foods that reduce uric acid made in your body. INDICATIONS FOR USE  Your caregiver may ask you to follow a low-purine diet to reduce gout flairs.  GUIDELINES  Avoid high-purine foods, including all alcohol, yeast extracts taken as supplements, and sauces made from meats (like gravy). Do not eat high-purine meats, including anchovies, sardines, herring, mussels, tuna, codfish, scallops, trout, haddock, bacon, organ meats, tripe, goose, wild game, and sweetbreads.  Grains  Allowed/Recommended: All, except those listed to consume in moderation.  Consume in Moderation: Oatmeal ( cup uncooked daily), wheat bran or germ ( cup daily), and whole grains. Vegetables  Allowed/Recommended: All, except those listed to consume in moderation.  Consume in Moderation: Asparagus, cauliflower, spinach, mushrooms, and green peas ( cup daily). Fruit  Allowed/Recommended: All.  Consume in Moderation: None. Meat and Meat Substitutes  Allowed/Recommended: Eggs, nuts, and peanut butter.  Consume in Moderation: Limit to 4 to 6 oz daily. Avoid high-purine meats. Lentils, peas, and dried beans (1 cup daily). Milk  Allowed/Recommended: All. Choose low-fat or skim when possible.  Consume in Moderation: None. Fats and Oils  Allowed/Recommended: All.  Consume in Moderation: None. Beverages  Allowed/Recommended: All, except those listed to avoid.  Avoid: All alcohol. Condiments/Miscellaneous  Allowed/Recommended: All, except those listed to consume in moderation.  Consume in Moderation: Bouillon and meat-based broths and soups. Document Released: 06/01/2010 Document Revised: 04/29/2011 Document Reviewed: 06/01/2010 [facility]  Patient Information 2014 [place], [place].  Gout Gout is when your joints become red, sore, and swell (inflammed). This is caused by the buildup of uric acid crystals in the joints. Uric acid is a chemical that is normally in the blood. If the level of uric acid gets too high in the blood, these crystals form in your joints and tissues. Over time, these crystals can form into masses near the joints and tissues. These masses can destroy bone and cause the bone to look misshapen (deformed). HOME CARE   Do not take aspirin for pain.  Only take medicine as told by your doctor.  Rest the joint as much as you can. When in bed, keep sheets and blankets off painful areas.  Keep the sore joints raised (elevated).  Put warm or cold packs on painful joints. Use of warm or cold packs depends on which works best for you.  Use crutches if the painful joint is in your leg.  Drink enough fluids to keep your pee (urine) clear or pale yellow. Limit alcohol, sugary drinks, and drinks with fructose in them.  Follow your diet instructions. Pay careful attention to how much protein you eat. Include fruits, vegetables, whole grains, and fat-free or low-fat milk products in your daily diet. Talk to your doctor or dietician about the use of coffee, vitamin C, and cherries. These may help lower uric acid levels.  Keep a healthy body weight. GET HELP RIGHT AWAY IF:   You have watery poop (diarrhea), throw up (vomit), or have any side effects from medicines.  You do not feel better in 24 hours, or you are getting worse.  Your joint becomes suddenly more tender, and you have chills or a fever. MAKE SURE YOU:   Understand these instructions.  Will watch your condition.  Will get help right  away if you are not doing well or get worse. Document Released: 11/14/2007 Document Revised: 06/01/2012 Document Reviewed: 05/15/2009 [facility] Patient Information 2014 [place], [place].

## 2013-05-10 NOTE — ED Provider Notes (Signed)
Medical screening examination/treatment/procedure(s) were performed by non-physician practitioner and as supervising physician I was immediately available for consultation/collaboration.   EKG Interpretation None       Courtney F Horton, MD 05/10/13 1439 

## 2013-10-26 ENCOUNTER — Emergency Department (HOSPITAL_COMMUNITY): Payer: Self-pay

## 2013-10-26 ENCOUNTER — Emergency Department (HOSPITAL_COMMUNITY)
Admission: EM | Admit: 2013-10-26 | Discharge: 2013-10-26 | Disposition: A | Discharge status: Home / Self Care | Payer: Self-pay | Attending: Emergency Medicine | Admitting: Emergency Medicine

## 2013-10-26 ENCOUNTER — Encounter (HOSPITAL_COMMUNITY): Payer: Self-pay | Admitting: Emergency Medicine

## 2013-10-26 DIAGNOSIS — Z9889 Other specified postprocedural states: Secondary | ICD-10-CM | POA: Insufficient documentation

## 2013-10-26 DIAGNOSIS — R000 Tachycardia, unspecified: Secondary | ICD-10-CM | POA: Insufficient documentation

## 2013-10-26 DIAGNOSIS — M25469 Effusion, unspecified knee: Secondary | ICD-10-CM | POA: Insufficient documentation

## 2013-10-26 DIAGNOSIS — M25561 Pain in right knee: Secondary | ICD-10-CM

## 2013-10-26 DIAGNOSIS — Z862 Personal history of diseases of the blood and blood-forming organs and certain disorders involving the immune mechanism: Secondary | ICD-10-CM | POA: Insufficient documentation

## 2013-10-26 DIAGNOSIS — M25569 Pain in unspecified knee: Secondary | ICD-10-CM | POA: Insufficient documentation

## 2013-10-26 DIAGNOSIS — Z87442 Personal history of urinary calculi: Secondary | ICD-10-CM | POA: Insufficient documentation

## 2013-10-26 DIAGNOSIS — Z8639 Personal history of other endocrine, nutritional and metabolic disease: Secondary | ICD-10-CM | POA: Insufficient documentation

## 2013-10-26 MED ORDER — OXYCODONE-ACETAMINOPHEN 5-325 MG PO TABS
1.0000 | ORAL_TABLET | Freq: Once | ORAL | Status: AC
  Administered 2013-10-26: 1 via ORAL
  Filled 2013-10-26: qty 1

## 2013-10-26 MED ORDER — NAPROXEN 500 MG PO TABS: 500.0000 mg | ORAL_TABLET | Freq: Two times a day (BID) | ORAL | Status: DC

## 2013-10-26 MED ORDER — HYDROCODONE-ACETAMINOPHEN 5-325 MG PO TABS: 1.0000 | ORAL_TABLET | ORAL | Status: DC | PRN

## 2013-10-26 NOTE — ED Notes (Signed)
Right knee pain x 2 days ago.  Denies injury.

## 2013-10-26 NOTE — ED Provider Notes (Signed)
CSN: [id number]     Arrival date & time 10/26/13  1116 History   First MD Initiated Contact with Patient 10/26/13 1402     Chief Complaint  Patient presents with  . Knee Pain     (Consider location/radiation/quality/duration/timing/severity/associated sxs/prior Treatment) Patient is a 23 y.o. male presenting with knee pain. The history is provided by the patient.  Knee Pain Location:  Knee Time since incident:  2 days Injury: no   Knee location:  R knee Pain details:    Quality:  Aching and sharp   Radiates to:  Does not radiate  Chad Weaver is a 23 y.o. male who presents to the ED with right knee pain that started 2 days ago. He does not remember any injury to the knee. 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 He has had surgery on the left knee by Dr. Romeo Apple. The pain is located laterally and medially.  He has taken nothing for pain.  Past Medical History  Diagnosis Date  . Gout   . Kidney stones   . H/O knee surgery    Past Surgical History  Procedure Laterality Date  . Anterior cruciate ligament repair     No family history on file. History  Substance Use Topics  . Smoking status: Never Smoker   . Smokeless tobacco: Not on file  . Alcohol Use: No    Review of Systems Negative except as stated in HPI   Allergies  Sulfonamide derivatives  Home Medications   Prior to Admission medications   Not on File   BP 160/82  Pulse 115  Temp(Src) 98.4 F (36.9 C) (Oral)  Resp 20  Ht  (1.778 m)  Wt 300 lb (136.079 kg)  BMI 43.05 kg/m2  SpO2 97% Physical Exam  Nursing note and vitals reviewed. Constitutional: He is oriented to person, place, and time. He appears well-developed and well-nourished.  HENT:  Head: Normocephalic and atraumatic.  Eyes: EOM are normal.  Neck: Neck supple.  Cardiovascular: Tachycardia present.   Pulmonary/Chest: Effort normal.  Abdominal: Soft. There is no  tenderness.  Musculoskeletal: Normal range of motion.       Right knee: He exhibits swelling. He exhibits no ecchymosis and normal alignment. Decreased range of motion: due to pain. Tenderness found. MCL and LCL tenderness noted.  Pedal pulses equal, adequate circulation, good touch sensation. Pain increases with flexion of the right knee.   Neurological: He is alert and oriented to person, place, and time. No cranial nerve deficit.  Skin: Skin is warm and dry.  Psychiatric: He has a normal mood and affect. His behavior is normal.    ED Course  Procedures (including critical care time) Labs Review Knee immobilizer, ice, pain management, elevation and follow up with ortho. Patient has crutches at home.  Labs Reviewed - No data to display  Imaging Review Dg Knee Complete 4 Views Right  10/26/2013   CLINICAL DATA:  Right knee pain  EXAM: RIGHT KNEE - COMPLETE 4+ VIEW  COMPARISON:  None.  FINDINGS: There is no evidence of fracture, dislocation, or joint effusion. There is no evidence of arthropathy or other focal bone abnormality. Soft tissues are unremarkable.  IMPRESSION: No acute abnormality noted.   Electronically Signed   By: Alcide Clever M.D.   On: 10/26/2013 11:56     MDM  23 y.o. male with pain and swelling to the right knee x 2 days.  Stable for discharge without neurovascular deficits. Placed in knee immobilizer, ice applied, elevation and follow up with Dr. Romeo Apple. I have reviewed this patient's vital signs, nurses notes, appropriate labs and imaging.  I have discussed findings with the patient and plan of care and he voices understanding and agrees with plan.    Medication List         HYDROcodone-acetaminophen 5-325 MG per tablet  Commonly known as:  NORCO/VICODIN  Take 1 tablet by mouth every 4 (four) hours as needed.     naproxen 500 MG tablet  Commonly known as:  NAPROSYN  Take 1 tablet (500 mg total) by mouth 2 (two) times daily.           Chad Weaver,  [place] 10/26/13 [phone]

## 2013-10-27 NOTE — ED Provider Notes (Signed)
Medical screening examination/treatment/procedure(s) were performed by non-physician practitioner and as supervising physician I was immediately available for consultation/collaboration.   EKG Interpretation None       Jacolby Risby, MD 10/27/13 0749 

## 2013-11-08 ENCOUNTER — Encounter: Payer: Self-pay | Admitting: Orthopedic Surgery

## 2013-11-08 ENCOUNTER — Ambulatory Visit: Payer: Self-pay | Admitting: Orthopedic Surgery

## 2013-11-24 ENCOUNTER — Emergency Department (HOSPITAL_COMMUNITY): Payer: Self-pay

## 2013-11-24 ENCOUNTER — Emergency Department (HOSPITAL_COMMUNITY)
Admission: EM | Admit: 2013-11-24 | Discharge: 2013-11-24 | Disposition: A | Discharge status: Home / Self Care | Payer: Self-pay | Attending: Emergency Medicine | Admitting: Emergency Medicine

## 2013-11-24 ENCOUNTER — Encounter (HOSPITAL_COMMUNITY): Payer: Self-pay | Admitting: Emergency Medicine

## 2013-11-24 DIAGNOSIS — Z8739 Personal history of other diseases of the musculoskeletal system and connective tissue: Secondary | ICD-10-CM | POA: Insufficient documentation

## 2013-11-24 DIAGNOSIS — Z9889 Other specified postprocedural states: Secondary | ICD-10-CM | POA: Insufficient documentation

## 2013-11-24 DIAGNOSIS — R109 Unspecified abdominal pain: Secondary | ICD-10-CM

## 2013-11-24 DIAGNOSIS — N200 Calculus of kidney: Secondary | ICD-10-CM | POA: Insufficient documentation

## 2013-11-24 LAB — CBC WITH DIFFERENTIAL/PLATELET
Basophils Absolute: 0 10*3/uL (ref 0.0–0.1)
Basophils Relative: 0 % (ref 0–1)
EOS PCT: 0 % (ref 0–5)
Eosinophils Absolute: 0 10*3/uL (ref 0.0–0.7)
HEMATOCRIT: 40.4 % (ref 39.0–52.0)
HEMOGLOBIN: 14.2 g/dL (ref 13.0–17.0)
LYMPHS PCT: 18 % (ref 12–46)
Lymphs Abs: 2.1 10*3/uL (ref 0.7–4.0)
MCH: 30 pg (ref 26.0–34.0)
MCHC: 35.1 g/dL (ref 30.0–36.0)
MCV: 85.4 fL (ref 78.0–100.0)
MONO ABS: 0.8 10*3/uL (ref 0.1–1.0)
MONOS PCT: 7 % (ref 3–12)
NEUTROS ABS: 8.8 10*3/uL — AB (ref 1.7–7.7)
Neutrophils Relative %: 75 % (ref 43–77)
Platelets: 240 10*3/uL (ref 150–400)
RBC: 4.73 MIL/uL (ref 4.22–5.81)
RDW: 12.9 % (ref 11.5–15.5)
WBC: 11.7 10*3/uL — AB (ref 4.0–10.5)

## 2013-11-24 LAB — COMPREHENSIVE METABOLIC PANEL
ALT: 35 U/L (ref 0–53)
ANION GAP: 13 (ref 5–15)
AST: 20 U/L (ref 0–37)
Albumin: 3.8 g/dL (ref 3.5–5.2)
Alkaline Phosphatase: 65 U/L (ref 39–117)
BILIRUBIN TOTAL: 0.4 mg/dL (ref 0.3–1.2)
BUN: 12 mg/dL (ref 6–23)
CALCIUM: 8.9 mg/dL (ref 8.4–10.5)
CHLORIDE: 104 meq/L (ref 96–112)
CO2: 25 meq/L (ref 19–32)
CREATININE: 0.83 mg/dL (ref 0.50–1.35)
Glucose, Bld: 102 mg/dL — ABNORMAL HIGH (ref 70–99)
Potassium: 4 mEq/L (ref 3.7–5.3)
Sodium: 142 mEq/L (ref 137–147)
Total Protein: 7 g/dL (ref 6.0–8.3)

## 2013-11-24 LAB — URINALYSIS, ROUTINE W REFLEX MICROSCOPIC
Bilirubin Urine: NEGATIVE
Glucose, UA: NEGATIVE mg/dL
KETONES UR: NEGATIVE mg/dL
LEUKOCYTES UA: NEGATIVE
NITRITE: NEGATIVE
PROTEIN: NEGATIVE mg/dL
Specific Gravity, Urine: 1.025 (ref 1.005–1.030)
UROBILINOGEN UA: 0.2 mg/dL (ref 0.0–1.0)
pH: 5.5 (ref 5.0–8.0)

## 2013-11-24 LAB — URINE MICROSCOPIC-ADD ON

## 2013-11-24 MED ORDER — PROMETHAZINE HCL 25 MG PO TABS: 25.0000 mg | ORAL_TABLET | Freq: Four times a day (QID) | ORAL | Status: DC | PRN

## 2013-11-24 MED ORDER — ONDANSETRON HCL 4 MG/2ML IJ SOLN
4.0000 mg | Freq: Once | INTRAMUSCULAR | Status: AC
  Administered 2013-11-24: 4 mg via INTRAVENOUS
  Filled 2013-11-24: qty 2

## 2013-11-24 MED ORDER — TAMSULOSIN HCL 0.4 MG PO CAPS: 0.4000 mg | ORAL_CAPSULE | Freq: Every day | ORAL | Status: DC

## 2013-11-24 MED ORDER — HYDROMORPHONE HCL 1 MG/ML IJ SOLN
1.0000 mg | Freq: Once | INTRAMUSCULAR | Status: AC
  Administered 2013-11-24: 1 mg via INTRAVENOUS
  Filled 2013-11-24: qty 1

## 2013-11-24 MED ORDER — HYDROMORPHONE HCL 1 MG/ML IJ SOLN
0.5000 mg | Freq: Once | INTRAMUSCULAR | Status: AC
  Administered 2013-11-24: 0.5 mg via INTRAVENOUS
  Filled 2013-11-24: qty 1

## 2013-11-24 MED ORDER — CEPHALEXIN 500 MG PO CAPS: 500.0000 mg | ORAL_CAPSULE | Freq: Three times a day (TID) | ORAL | Status: DC

## 2013-11-24 MED ORDER — OXYCODONE-ACETAMINOPHEN 5-325 MG PO TABS: 1.0000 | ORAL_TABLET | Freq: Four times a day (QID) | ORAL | Status: DC | PRN

## 2013-11-24 MED ORDER — KETOROLAC TROMETHAMINE 30 MG/ML IJ SOLN
30.0000 mg | Freq: Once | INTRAMUSCULAR | Status: AC
  Administered 2013-11-24: 30 mg via INTRAVENOUS
  Filled 2013-11-24: qty 1

## 2013-11-24 NOTE — ED Provider Notes (Signed)
CSN: [id number]     Arrival date & time 11/24/13  1435 History   First MD Initiated Contact with Patient 11/24/13 1513     Chief Complaint  Patient presents with  . Flank Pain     (Consider location/radiation/quality/duration/timing/severity/associated sxs/prior Treatment) Patient is a 23 y.o. male presenting with flank pain. The history is provided by the patient (the pt complains of left flank pain).  Flank Pain This is a new problem. The current episode started 12 to 24 hours ago. The problem occurs constantly. The problem has not changed since onset.Associated symptoms include abdominal pain. Pertinent negatives include no chest pain and no headaches. Nothing aggravates the symptoms. Nothing relieves the symptoms.    Past Medical History  Diagnosis Date  . Gout   . Kidney stones   . H/O knee surgery    Past Surgical History  Procedure Laterality Date  . Anterior cruciate ligament repair     No family history on file. History  Substance Use Topics  . Smoking status: Never Smoker   . Smokeless tobacco: Not on file  . Alcohol Use: No    Review of Systems  Constitutional: Negative for appetite change and fatigue.  HENT: Negative for congestion, ear discharge and sinus pressure.   Eyes: Negative for discharge.  Respiratory: Negative for cough.   Cardiovascular: Negative for chest pain.  Gastrointestinal: Positive for abdominal pain. Negative for diarrhea.  Genitourinary: Positive for flank pain. Negative for frequency and hematuria.  Musculoskeletal: Negative for back pain.  Skin: Negative for rash.  Neurological: Negative for seizures and headaches.  Psychiatric/Behavioral: Negative for hallucinations.      Allergies  Sulfonamide derivatives  Home Medications   Prior to Admission medications   Medication Sig Start Date End Date Taking? Authorizing Provider  cephALEXin (KEFLEX) 500 MG capsule Take 1 capsule (500 mg total) by mouth 3 (three) times daily. 11/24/13    Benny LennertJoseph L Brynlynn Walko, MD  oxyCODONE-acetaminophen (PERCOCET/ROXICET) 5-325 MG per tablet Take 1 tablet by mouth every 6 (six) hours as needed. 11/24/13   Benny LennertJoseph L Slate Debroux, MD  promethazine (PHENERGAN) 25 MG tablet Take 1 tablet (25 mg total) by mouth every 6 (six) hours as needed for nausea or vomiting. 11/24/13   Benny LennertJoseph L Ziyanna Tolin, MD  tamsulosin (FLOMAX) 0.4 MG CAPS capsule Take 1 capsule (0.4 mg total) by mouth daily. 11/24/13   Benny LennertJoseph L Cherrish Vitali, MD   BP 149/84  Pulse 97  Temp(Src) 98.4 F (36.9 C) (Oral)  Resp 16  SpO2 100% Physical Exam  Constitutional: He is oriented to person, place, and time. He appears well-developed.  HENT:  Head: Normocephalic.  Eyes: Conjunctivae and EOM are normal. No scleral icterus.  Neck: Neck supple. No thyromegaly present.  Cardiovascular: Normal rate and regular rhythm.  Exam reveals no gallop and no friction rub.   No murmur heard. Pulmonary/Chest: No stridor. He has no wheezes. He has no rales. He exhibits no tenderness.  Abdominal: He exhibits no distension. There is no tenderness. There is no rebound.  Genitourinary:  Tender left flank  Musculoskeletal: Normal range of motion. He exhibits no edema.  Lymphadenopathy:    He has no cervical adenopathy.  Neurological: He is oriented to person, place, and time. He exhibits normal muscle tone. Coordination normal.  Skin: No rash noted. No erythema.  Psychiatric: He has a normal mood and affect. His behavior is normal.    ED Course  Procedures (including critical care time) Labs Review Labs Reviewed  CBC WITH DIFFERENTIAL -  Abnormal; Notable for the following:    WBC 11.7 (*)    Neutro Abs 8.8 (*)    All other components within normal limits  COMPREHENSIVE METABOLIC PANEL - Abnormal; Notable for the following:    Glucose, Bld 102 (*)    All other components within normal limits  URINALYSIS, ROUTINE W REFLEX MICROSCOPIC - Abnormal; Notable for the following:    Hgb urine dipstick LARGE (*)    All other  components within normal limits  URINE MICROSCOPIC-ADD ON - Abnormal; Notable for the following:    Squamous Epithelial / LPF FEW (*)    Bacteria, UA FEW (*)    All other components within normal limits    Imaging Review Ct Renal Stone Study  11/24/2013   CLINICAL DATA:  Left flank pain beginning today.  Initial encounter.  EXAM: CT ABDOMEN AND PELVIS WITHOUT CONTRAST  TECHNIQUE: Multidetector CT imaging of the abdomen and pelvis was performed following the standard protocol without IV contrast.  COMPARISON:  CT abdomen and pelvis 07/06/2011.  FINDINGS: The lung bases are clear.  No pleural or pericardial effusion.  There is mild left hydronephrosis with stranding about the left kidney and ureter. Two ureteral stones are identified each measuring 1-2 mm. One of the stones is just proximal to the UVJ on image 83. The second stone is more proximal located just below the pelvic brim on image 66. No other renal or ureteral stones are identified.  The liver is diffusely low attenuating consistent with fatty infiltration. The gallbladder, adrenal glands, spleen and pancreas appear normal. The biliary tree is unremarkable. The urinary bladder is nearly completely decompressed but otherwise unremarkable. The stomach, small and large bowel and appendix appear normal. There is no lymphadenopathy or fluid. No lytic or sclerotic bony lesion is identified.  IMPRESSION: Mild left hydronephrosis due to 2 distal left ureteral stones each measuring 1-2 mm in size.  Diffuse fatty infiltration of the liver.   Electronically Signed   By: Drusilla Kanner M.D.   On: 11/24/2013 16:23     EKG Interpretation None      MDM   Final diagnoses:  Kidney stone    Kidney stones   Benny Lennert, MD 11/24/13 [phone]

## 2013-11-24 NOTE — ED Notes (Signed)
Left flank pain that began today.

## 2013-11-24 NOTE — Discharge Instructions (Signed)
Follow up with [facility] next week °

## 2014-03-06 ENCOUNTER — Emergency Department (HOSPITAL_COMMUNITY)
Admission: EM | Admit: 2014-03-06 | Discharge: 2014-03-06 | Disposition: A | Discharge status: Home / Self Care | Payer: Self-pay | Attending: Emergency Medicine | Admitting: Emergency Medicine

## 2014-03-06 ENCOUNTER — Encounter (HOSPITAL_COMMUNITY): Payer: Self-pay | Admitting: *Deleted

## 2014-03-06 DIAGNOSIS — M10071 Idiopathic gout, right ankle and foot: Secondary | ICD-10-CM | POA: Insufficient documentation

## 2014-03-06 DIAGNOSIS — Z87442 Personal history of urinary calculi: Secondary | ICD-10-CM | POA: Insufficient documentation

## 2014-03-06 DIAGNOSIS — M109 Gout, unspecified: Secondary | ICD-10-CM

## 2014-03-06 DIAGNOSIS — Z79899 Other long term (current) drug therapy: Secondary | ICD-10-CM | POA: Insufficient documentation

## 2014-03-06 DIAGNOSIS — Z720 Tobacco use: Secondary | ICD-10-CM | POA: Insufficient documentation

## 2014-03-06 MED ORDER — INDOMETHACIN 25 MG PO CAPS: 25.0000 mg | ORAL_CAPSULE | Freq: Three times a day (TID) | ORAL | Status: DC | PRN

## 2014-03-06 MED ORDER — PREDNISONE 50 MG PO TABS: ORAL_TABLET | ORAL | Status: DC

## 2014-03-06 MED ORDER — OXYCODONE-ACETAMINOPHEN 5-325 MG PO TABS: 2.0000 | ORAL_TABLET | ORAL | Status: DC | PRN

## 2014-03-06 MED ORDER — COLCHICINE 0.6 MG PO TABS: 0.6000 mg | ORAL_TABLET | Freq: Every day | ORAL | Status: DC

## 2014-03-06 NOTE — Discharge Instructions (Signed)
Gout Gout is when your joints become red, sore, and swell (inflamed). This is caused by the buildup of uric acid crystals in the joints. Uric acid is a chemical that is normally in the blood. If the level of uric acid gets too high in the blood, these crystals form in your joints and tissues. Over time, these crystals can form into masses near the joints and tissues. These masses can destroy bone and cause the bone to look misshapen (deformed). HOME CARE   Do not take aspirin for pain.  Only take medicine as told by your doctor.  Rest the joint as much as you can. When in bed, keep sheets and blankets off painful areas.  Keep the sore joints raised (elevated).  Put warm or cold packs on painful joints. Use of warm or cold packs depends on which works best for you.  Use crutches if the painful joint is in your leg.  Drink enough fluids to keep your pee (urine) clear or pale yellow. Limit alcohol, sugary drinks, and drinks with fructose in them.  Follow your diet instructions. Pay careful attention to how much protein you eat. Include fruits, vegetables, whole grains, and fat-free or low-fat milk products in your daily diet. Talk to your doctor or dietitian about the use of coffee, vitamin C, and cherries. These may help lower uric acid levels.  Keep a healthy body weight. GET HELP RIGHT AWAY IF:   You have watery poop (diarrhea), throw up (vomit), or have any side effects from medicines.  You do not feel better in 24 hours, or you are getting worse.  Your joint becomes suddenly more tender, and you have chills or a fever. MAKE SURE YOU:   Understand these instructions.  Will watch your condition.  Will get help right away if you are not doing well or get worse. Document Released: 11/14/2007 Document Revised: 06/21/2013 Document Reviewed: 09/18/2011 [facility] Patient Information 2015 [place], [place]. This information is not intended to replace advice given to you by your health care  provider. Make sure you discuss any questions you have with your health care provider.   Prescription for pain medication, prednisone, anti-inflammatory medicine, colchicine. Need to get primary care physician. Resource guide given.    Emergency Department Resource Guide 1) Find a Doctor and Pay Out of Pocket Although you won't have to find out who is covered by your insurance plan, it is a good idea to ask around and get recommendations. You will then need to call the office and see if the doctor you have chosen will accept you as a new patient and what types of options they offer for patients who are self-pay. Some doctors offer discounts or will set up payment plans for their patients who do not have insurance, but you will need to ask so you aren't surprised when you get to your appointment.  2) Contact Your Local Health Department Not all health departments have doctors that can see patients for sick visits, but many do, so it is worth a call to see if yours does. If you don't know where your local health department is, you can check in your phone book. The CDC also has a tool to help you locate your state's health department, and many state websites also have listings of all of their local health departments.  3) Find a Walk-in Clinic If your illness is not likely to be very severe or complicated, you may want to try a walk in clinic. These are  popping up all over the country in pharmacies, drugstores, and shopping centers. They're usually staffed by nurse practitioners or physician assistants that have been trained to treat common illnesses and complaints. They're usually fairly quick and inexpensive. However, if you have serious medical issues or chronic medical problems, these are probably not your best option.  No Primary Care Doctor: - Call Health Connect at  [phone] - they can help you locate a primary care doctor that  accepts your insurance, provides certain services,  etc. - Physician Referral Service- [phone]  Chronic Pain Problems: Organization         Address  Phone   Notes  [facility]  [phone] Patients need to be referred by their primary care doctor.   Medication Assistance: Organization         Address  Phone   Notes  [facility] [address] [place], [place] [address] [phone]) [phone] --Must be a resident of [facility] -- Must have NO insurance coverage whatsoever (no Medicaid/ Medicare, etc.) -- The pt. MUST have a primary care doctor that directs their care regularly and follows them in the community   MedAssist  [phone]   [employer]  [phone]    Agencies that provide inexpensive medical care: Organization         Address  Phone   Notes  [facility]  [phone]   [facility]    [phone]   [facility] [address] [place], [place] [address] [phone]   [facility] [address], [place] [phone]   [facility]anned Parenthood    [phone]) [phone]   [facility]    [phone]   [facility]  [address], [place] Phone:  [phone], Fax:  [phone] Hours of Operation:  9 am - 6 pm, M-F.  Also accepts Medicaid/Medicare and self-pay.  [facility]  [address], [place] Phone: [phone], Fax: [phone]) [phone]. Hours of Operation:  8:30 am - 5:30 pm, M-F.  Also accepts Medicaid and self-pay.  [facility] [place] [address], [place] Phone: [phone]   [facility] [address], [place], [place] [phone], Ext. [phone] Mondays & Thursdays: 7-9 AM.  First 15 patients are seen on a first come, first serve basis.    Medicaid-accepting [facility] Providers:  Organization         Address  Phone   Notes  [facility] [address], [place] [phone] Also accepts self-pay patients.  [facility] [address], [place]  [phone]   [facility] [address], [place] [phone]   [facility] [address] [place] [address], [place] [phone]   Renaye RakersVeita Bland [address], [place]   [phone] Only accepts [place] Access [place] patients after they have their name applied to their card.   Self-Pay (no insurance) in [facility] [place]:  Organization         Address  Phone   Notes  Sickle Cell Patients, [facility] [address], [place] [phone]   [facility] [address], [place] [phone]   [facility] [place]  [address], [place] [phone]   [facility]/Dr. Osei-Bonsu  [address] [place] or [address], [place] [phone] Phone number for both [place] and [place] locations is the same.  [facility] [address], [place] [phone]   [facility] [address] [place] or [address] [phone]   [facility] [address], [place] [phone], phone; [phone], fax Sees patients 1st and 3rd Saturday of every month.  Must not qualify for public or private insurance (i.e. Medicaid, Medicare, [place] Health Choice, Veterans' Benefits)  Household income should be no more than 200% of the poverty level The clinic cannot treat you if you are pregnant or think you are pregnant  Sexually transmitted diseases are not treated at the clinic.    Dental Care: Organization         Address  Phone  Notes  [facility] Department of [facility] [address] [place], [place] [phone] Accepts children up to  age 72 who are enrolled in [place] or [place] Health Choice; pregnant women with a Medicaid card; and children who have applied for Medicaid or [place] Health Choice, but were declined, whose parents can pay a reduced fee at time of service.  [facility] of [facility]  [address], [place] [phone] Accepts children up to age 13 who are enrolled in [place] or [place] Health Choice; pregnant women with a Medicaid card; and children who have applied for Medicaid or [place] Health Choice, but were declined, whose parents can pay a reduced fee at time of service.  [facility]  [address] [place], [place] [phone] Patients are seen by appointment only. Walk-ins are not accepted. [facility] will see patients 59 years of age and older. Monday - Tuesday (8am-5pm) Most Wednesdays (8:30-5pm) $30 per visit, cash only  [facility]  [address], [facility] [phone] Patients are seen by appointment only. Walk-ins are not accepted. [facility] will see patients 92 years of age and older. One Wednesday Evening (Monthly: Volunteer Based).  $30 per visit, cash only  [employer]  [phone] for adults; Children under age 32, call Graduate Pediatric Dentistry at [phone]. Children aged 56-14, please call [phone] to request a pediatric application.  Dental services are provided in all areas of dental care including fillings, crowns and bridges, complete and partial dentures, implants, gum treatment, root canals, and extractions. Preventive care is also provided. Treatment is provided to both adults and children. Patients are selected via a lottery and there is often a waiting list.   [facility] [address] [place]  [phone] [email or web address]   [facility] [address] [place], [place] [phone], Ext. [phone] Second and Fourth Thursday of  each month, opens at 6:30 AM; Clinic ends at 9 AM.  Patients are seen on a first-come first-served basis, and a limited number are seen during each clinic.   [facility]  [address] [place], [place] [phone]   Eligibility Requirements You must have lived in [place], [place], or [place] counties for at least the last three months.   You cannot be eligible for state or federal sponsored [place], including [employer], [place],  or Medicare.   You generally cannot be eligible for healthcare insurance through your employer.    How to apply: Eligibility screenings are held every Tuesday and Wednesday afternoon from 1:00 pm until 4:00 pm. You do not need an appointment for the interview!  [facility] [address], [place], [place] [phone]   [facility]  [phone]   [facility]  [phone]   [facility]  [phone]    Behavioral Health Resources in the Community: Intensive Outpatient Programs Organization         Address  Phone  Notes  [facility] [address], [place], [place] [phone]   [facility] [address], [place], [place] [phone]   [facility] [address], [place], [place]  [phone]   [facility] [address],  [place], [place] [phone] or [phone]   Substance Abuse Resources Organization         Address  Phone  Notes  [facility]  [phone]   [facility]  [phone]   [facility]  [phone]   [facility]  [phone]   Residential & Outpatient Substance Abuse Program  [phone]   Psychological Services Organization         Address  Phone  Notes  [facility]  [phone]- [phone]   [facility]  [phone]- [phone]   [facility] [address],  [place] [phone] or [phone]    Mobile Crisis Teams Organization         Address  Phone  Notes  [facility], Mobile Crisis Care Unit  [phone]   [facility]  [address] [place], [place] [phone]   Doristine LocksSharon DeEsch [address] [place] [phone]    Self-Help/Support Groups Organization         Address  Phone             Notes  [facility] [place] - variety of support groups  [phone] Call for more information  Narcotics Anonymous (NA), [facility] [address], [place]  2 meetings at this location   Statisticianesidential Treatment Programs Organization         Address  Phone  Notes  [facility] [address],    [place]  [phone]   [facility]  [address], [place] [phone], [place], [place] [phone]   [facility] [address], [place] [phone] Admissions: 8am-3pm M-F  [facility] [address]    [place], [place] [phone]   [facility] [address], [place], [place] [phone]   [facility] [address]  [place], [place] [phone]   [facility] - Intensive Outpatient [address], [place], [place] [phone]   [facility] ([facility] [address]  [place], [place] [phone] or [phone]   [facility] ([facility]) [address] [place], [place] [phone] Accepts Medicaid  [facility] [address]  [place] [phone] Substance Abuse/Addiction Treatment   [facility] Resources Organization         Address  Phone  Notes  [facility]  [phone]) [phone]   Angie FavaJulie Brannon, PhD [address] [place], [place]   [phone] or [phone]) [phone]   [facility]   [address] [place], [place] [phone]     [facility] [address] [place], [place] [phone] Insurance/Medicaid/sponsorship through [facility] and Families [address]                                    [employer], [place] [phone] Therapy/tele-psych/case  [facility] [address]   [place], [place] [phone]    Dr. Lolly Mustache  [phone]   [facility] [place]  [employer] [facility] Dept. 1) [address] [place] 2) [address], [place] 3)  [address], [place] [phone]  [phone]   [facility] Child Abuse Hotline [phone])[phone] or [phone] (After Hours)

## 2014-03-06 NOTE — ED Notes (Signed)
Pt states pain to right foot. States that it feels like gout pain. States he is out of his medication.

## 2014-03-06 NOTE — ED Provider Notes (Signed)
CSN: [id number]     Arrival date & time 03/06/14  [id number] History  This chart was scribed for Donnetta HutchingBrian Denaisha Swango, MD by Littie Deedsichard Sun, ED Scribe. This patient was seen in room APA03/APA03 and the patient's care was started at 10:00 AM.     Chief Complaint  Patient presents with  . Gout   The history is provided by the patient. No language interpreter was used.    HPI Comments: Chad Weaver is a 24 y.o. male with a hx of gout who presents to the Emergency Department complaining of gradual onset right foot pain in the lateral proximal aspect that started yesterday. Patient states it feels like gout flare-ups that he has had in the past. He states that he has been on allopurinol QD in the past; however, he has not been taking it lately as he has not followed up with his PCP due to financial reasons. Patient drove himself here today. 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  PCP: Dr. Olena LeatherwoodHasanaj  Past Medical History  Diagnosis Date  . Gout   . Kidney stones   . H/O knee surgery    Past Surgical History  Procedure Laterality Date  . Anterior cruciate ligament repair     No family history on file. History  Substance Use Topics  . Smoking status: Current Every Day Smoker    Types: Cigarettes  . Smokeless tobacco: Not on file  . Alcohol Use: No    Review of Systems  All other systems reviewed and are negative.     Allergies  Sulfonamide derivatives  Home Medications   Prior to Admission medications   Medication Sig Start Date End Date Taking? Authorizing Provider  cephALEXin (KEFLEX) 500 MG capsule Take 1 capsule (500 mg total) by mouth 3 (three) times daily. 11/24/13   Benny LennertJoseph L Zammit, MD  colchicine 0.6 MG tablet Take 1 tablet (0.6 mg total) by mouth daily. 03/06/14   Donnetta HutchingBrian Brennley Curtice, MD  indomethacin (INDOCIN) 25 MG capsule Take 1 capsule (25 mg total) by mouth 3 (three) times daily as needed. 03/06/14   Donnetta HutchingBrian Franca Stakes, MD  oxyCODONE-acetaminophen (PERCOCET) 5-325 MG per tablet Take 2 tablets by mouth  every 4 (four) hours as needed. 03/06/14   Donnetta HutchingBrian Offie Waide, MD  predniSONE (DELTASONE) 50 MG tablet 1 tablet daily for 6 days, one half tablet daily for 6 days 03/06/14   Donnetta HutchingBrian Peja Allender, MD  promethazine (PHENERGAN) 25 MG tablet Take 1 tablet (25 mg total) by mouth every 6 (six) hours as needed for nausea or vomiting. 11/24/13   Benny LennertJoseph L Zammit, MD  tamsulosin (FLOMAX) 0.4 MG CAPS capsule Take 1 capsule (0.4 mg total) by mouth daily. 11/24/13   Benny LennertJoseph L Zammit, MD   BP 132/95 mmHg  Pulse 85  Temp(Src) 98.3 F (36.8 C) (Oral)  Resp 18  Ht 5\' 11"  (1.803 m)  Wt 320 lb (145.151 kg)  BMI 44.65 kg/m2  SpO2 98% Physical Exam  Constitutional: He is oriented to person, place, and time. He appears well-developed and well-nourished.  HENT:  Head: Normocephalic and atraumatic.  Musculoskeletal: Normal range of motion.  Pain in lateral, proximal aspect of right foot.  Neurological: He is alert and oriented to person, place, and time.  Skin: Skin is warm and dry.  Psychiatric: He has a normal mood and affect. His behavior is normal.  Nursing note and vitals reviewed.   ED Course  Procedures  DIAGNOSTIC STUDIES: Oxygen Saturation is 100% on room air, normal by my  interpretation.    COORDINATION OF CARE: 10:03 AM-Discussed treatment plan which includes pain medication, prednisone, indocin and allopurinol with pt at bedside and pt agreed to plan. Patient is to follow-up with PCP.   Labs Review Labs Reviewed - No data to display  Imaging Review No results found.   EKG Interpretation None      MDM   Final diagnoses:  Acute gout of right foot, unspecified cause    History and physical consistent with gout.   Rx Percocet, prednisone, Indocin, colchicine.   Follow-up with primary care.  I personally performed the services described in this documentation, which was scribed in my presence. The recorded information has been reviewed and is accurate.    Donnetta Hutching, MD 03/08/14 2144

## 2014-07-05 ENCOUNTER — Emergency Department (HOSPITAL_COMMUNITY)
Admission: EM | Admit: 2014-07-05 | Discharge: 2014-07-05 | Disposition: A | Discharge status: Home / Self Care | Payer: Medicaid Other | Attending: Emergency Medicine | Admitting: Emergency Medicine

## 2014-07-05 ENCOUNTER — Encounter (HOSPITAL_COMMUNITY): Payer: Self-pay | Admitting: Emergency Medicine

## 2014-07-05 DIAGNOSIS — Z7952 Long term (current) use of systemic steroids: Secondary | ICD-10-CM | POA: Diagnosis not present

## 2014-07-05 DIAGNOSIS — M25461 Effusion, right knee: Secondary | ICD-10-CM

## 2014-07-05 DIAGNOSIS — Z9889 Other specified postprocedural states: Secondary | ICD-10-CM | POA: Insufficient documentation

## 2014-07-05 DIAGNOSIS — R269 Unspecified abnormalities of gait and mobility: Secondary | ICD-10-CM | POA: Insufficient documentation

## 2014-07-05 DIAGNOSIS — Z87442 Personal history of urinary calculi: Secondary | ICD-10-CM | POA: Diagnosis not present

## 2014-07-05 DIAGNOSIS — Z79899 Other long term (current) drug therapy: Secondary | ICD-10-CM | POA: Diagnosis not present

## 2014-07-05 DIAGNOSIS — Z792 Long term (current) use of antibiotics: Secondary | ICD-10-CM | POA: Diagnosis not present

## 2014-07-05 DIAGNOSIS — Z791 Long term (current) use of non-steroidal anti-inflammatories (NSAID): Secondary | ICD-10-CM | POA: Insufficient documentation

## 2014-07-05 DIAGNOSIS — M109 Gout, unspecified: Secondary | ICD-10-CM | POA: Diagnosis not present

## 2014-07-05 DIAGNOSIS — Z720 Tobacco use: Secondary | ICD-10-CM | POA: Insufficient documentation

## 2014-07-05 DIAGNOSIS — R509 Fever, unspecified: Secondary | ICD-10-CM | POA: Insufficient documentation

## 2014-07-05 DIAGNOSIS — M25561 Pain in right knee: Secondary | ICD-10-CM | POA: Diagnosis present

## 2014-07-05 LAB — CBC WITH DIFFERENTIAL/PLATELET
BASOS ABS: 0 10*3/uL (ref 0.0–0.1)
Basophils Relative: 0 % (ref 0–1)
EOS ABS: 0 10*3/uL (ref 0.0–0.7)
EOS PCT: 0 % (ref 0–5)
HEMATOCRIT: 43.4 % (ref 39.0–52.0)
Hemoglobin: 15 g/dL (ref 13.0–17.0)
LYMPHS PCT: 10 % — AB (ref 12–46)
Lymphs Abs: 1.5 10*3/uL (ref 0.7–4.0)
MCH: 29.9 pg (ref 26.0–34.0)
MCHC: 34.6 g/dL (ref 30.0–36.0)
MCV: 86.6 fL (ref 78.0–100.0)
MONO ABS: 1.8 10*3/uL — AB (ref 0.1–1.0)
Monocytes Relative: 12 % (ref 3–12)
Neutro Abs: 12.1 10*3/uL — ABNORMAL HIGH (ref 1.7–7.7)
Neutrophils Relative %: 78 % — ABNORMAL HIGH (ref 43–77)
Platelets: 305 10*3/uL (ref 150–400)
RBC: 5.01 MIL/uL (ref 4.22–5.81)
RDW: 13.1 % (ref 11.5–15.5)
WBC: 15.3 10*3/uL — AB (ref 4.0–10.5)

## 2014-07-05 LAB — SYNOVIAL CELL COUNT + DIFF, W/ CRYSTALS
EOSINOPHILS-SYNOVIAL: 0 % (ref 0–1)
Lymphocytes-Synovial Fld: 0 % (ref 0–20)
MONOCYTE-MACROPHAGE-SYNOVIAL FLUID: 3 % — AB (ref 50–90)
Neutrophil, Synovial: 97 % — ABNORMAL HIGH (ref 0–25)
Other Cells-SYN: 0
WBC, Synovial: 29454 /mm3 — ABNORMAL HIGH (ref 0–200)

## 2014-07-05 LAB — SEDIMENTATION RATE: SED RATE: 18 mm/h — AB (ref 0–16)

## 2014-07-05 MED ORDER — LIDOCAINE HCL (PF) 1 % IJ SOLN
30.0000 mL | Freq: Once | INTRAMUSCULAR | Status: AC
  Administered 2014-07-05: 10 mL via INTRADERMAL
  Filled 2014-07-05: qty 30

## 2014-07-05 MED ORDER — KETOROLAC TROMETHAMINE 30 MG/ML IJ SOLN
30.0000 mg | Freq: Once | INTRAMUSCULAR | Status: AC
  Administered 2014-07-05: 30 mg via INTRAVENOUS
  Filled 2014-07-05: qty 1

## 2014-07-05 MED ORDER — HYDROCODONE-ACETAMINOPHEN 5-325 MG PO TABS: 2.0000 | ORAL_TABLET | ORAL | Status: DC | PRN

## 2014-07-05 MED ORDER — MORPHINE SULFATE 4 MG/ML IJ SOLN
8.0000 mg | Freq: Once | INTRAMUSCULAR | Status: AC
  Administered 2014-07-05: 8 mg via INTRAMUSCULAR
  Filled 2014-07-05: qty 2

## 2014-07-05 MED ORDER — COLCHICINE 0.6 MG PO TABS: 0.6000 mg | ORAL_TABLET | Freq: Three times a day (TID) | ORAL | Status: DC

## 2014-07-05 MED ORDER — FEBUXOSTAT 40 MG PO TABS
80.0000 mg | ORAL_TABLET | Freq: Every day | ORAL | Status: DC
  Administered 2014-07-05: 80 mg via ORAL
  Filled 2014-07-05 (×2): qty 2

## 2014-07-05 MED ORDER — HYDROCODONE-ACETAMINOPHEN 5-325 MG PO TABS
2.0000 | ORAL_TABLET | Freq: Once | ORAL | Status: AC
  Administered 2014-07-05: 2 via ORAL
  Filled 2014-07-05: qty 2

## 2014-07-05 NOTE — Discharge Instructions (Signed)
If you were given medicines take as directed.  If you are on coumadin or contraceptives realize their levels and effectiveness is altered by many different medicines.  If you have any reaction (rash, tongues swelling, other) to the medicines stop taking and see a physician.   Please follow up as directed and return to the ER or see a physician for new or worsening symptoms.  Thank you. Filed Vitals:   07/05/14 0901  BP: 142/82  Pulse: 102  Temp: 98.5 F (36.9 C)  TempSrc: Oral  Resp: 20  SpO2: 97%    Gout Gout is when your joints become red, sore, and swell (inflamed). This is caused by the buildup of uric acid crystals in the joints. Uric acid is a chemical that is normally in the blood. If the level of uric acid gets too high in the blood, these crystals form in your joints and tissues. Over time, these crystals can form into masses near the joints and tissues. These masses can destroy bone and cause the bone to look misshapen (deformed). HOME CARE   Do not take aspirin for pain.  Only take medicine as told by your doctor.  Rest the joint as much as you can. When in bed, keep sheets and blankets off painful areas.  Keep the sore joints raised (elevated).  Put warm or cold packs on painful joints. Use of warm or cold packs depends on which works best for you.  Use crutches if the painful joint is in your leg.  Drink enough fluids to keep your pee (urine) clear or pale yellow. Limit alcohol, sugary drinks, and drinks with fructose in them.  Follow your diet instructions. Pay careful attention to how much protein you eat. Include fruits, vegetables, whole grains, and fat-free or low-fat milk products in your daily diet. Talk to your doctor or dietitian about the use of coffee, vitamin C, and cherries. These may help lower uric acid levels.  Keep a healthy body weight. GET HELP RIGHT AWAY IF:   You have watery poop (diarrhea), throw up (vomit), or have any side effects from  medicines.  You do not feel better in 24 hours, or you are getting worse.  Your joint becomes suddenly more tender, and you have chills or a fever. MAKE SURE YOU:   Understand these instructions.  Will watch your condition.  Will get help right away if you are not doing well or get worse. Document Released: 11/14/2007 Document Revised: 06/21/2013 Document Reviewed: 09/18/2011 [facility] Patient Information 2015 [place], [place]. This information is not intended to replace advice given to you by your health care provider. Make sure you discuss any questions you have with your health care provider.

## 2014-07-05 NOTE — ED Provider Notes (Signed)
CSN: [id number]     Arrival date & time 07/05/14  [id number] History   First MD Initiated Contact with Patient 07/05/14 [phone]     Chief Complaint  Patient presents with  . Knee Pain     (Consider location/radiation/quality/duration/timing/severity/associated sxs/prior Treatment) HPI Comments: 24 year old male with history of anterior cruciate ligament tear, gout and great toes presents with worsening pain and swelling to the right knee causing difficulty with ambulation. Patient has never had this in the past. No injuries. Patient has had subjective fevers and chills. Pain with any range of motion and swelling gradually worsening. Patient does not currently have orthopedic doctor. Every day smoker  Patient is a 24 y.o. male presenting with knee pain. The history is provided by the patient.  Knee Pain Associated symptoms: fever   Associated symptoms: no back pain and no neck pain     Past Medical History  Diagnosis Date  . Gout   . Kidney stones   . H/O knee surgery    Past Surgical History  Procedure Laterality Date  . Anterior cruciate ligament repair    . Tympanostomy tube placement     History reviewed. No pertinent family history. History  Substance Use Topics  . Smoking status: Current Every Day Smoker -- 0.50 packs/day    Types: Cigarettes  . Smokeless tobacco: Not on file  . Alcohol Use: No    Review of Systems  Constitutional: Positive for fever.  Respiratory: Negative for shortness of breath.   Cardiovascular: Negative for chest pain.  Gastrointestinal: Negative for vomiting and abdominal pain.  Genitourinary: Negative for dysuria and flank pain.  Musculoskeletal: Positive for joint swelling and gait problem. Negative for back pain, neck pain and neck stiffness.  Skin: Negative for rash.  Neurological: Negative for light-headedness and headaches.      Allergies  Sulfonamide derivatives  Home Medications   Prior to Admission medications   Medication Sig Start  Date End Date Taking? Authorizing Provider  cephALEXin (KEFLEX) 500 MG capsule Take 1 capsule (500 mg total) by mouth 3 (three) times daily. Patient not taking: Reported on 07/05/2014 11/24/13   Bethann BerkshireJoseph Zammit, MD  colchicine 0.6 MG tablet Take 1 tablet (0.6 mg total) by mouth daily. Patient not taking: Reported on 07/05/2014 03/06/14   Donnetta HutchingBrian Cook, MD  colchicine 0.6 MG tablet Take 1 tablet (0.6 mg total) by mouth 3 (three) times daily. 07/05/14   Blane OharaJoshua Gayna Braddy, MD  HYDROcodone-acetaminophen (NORCO) 5-325 MG per tablet Take 2 tablets by mouth every 4 (four) hours as needed. 07/05/14   Blane OharaJoshua Feras Gardella, MD  indomethacin (INDOCIN) 25 MG capsule Take 1 capsule (25 mg total) by mouth 3 (three) times daily as needed. Patient not taking: Reported on 07/05/2014 03/06/14   Donnetta HutchingBrian Cook, MD  oxyCODONE-acetaminophen (PERCOCET) 5-325 MG per tablet Take 2 tablets by mouth every 4 (four) hours as needed. Patient not taking: Reported on 07/05/2014 03/06/14   Donnetta HutchingBrian Cook, MD  predniSONE (DELTASONE) 50 MG tablet 1 tablet daily for 6 days, one half tablet daily for 6 days Patient not taking: Reported on 07/05/2014 03/06/14   Donnetta HutchingBrian Cook, MD  promethazine (PHENERGAN) 25 MG tablet Take 1 tablet (25 mg total) by mouth every 6 (six) hours as needed for nausea or vomiting. Patient not taking: Reported on 07/05/2014 11/24/13   Bethann BerkshireJoseph Zammit, MD  tamsulosin (FLOMAX) 0.4 MG CAPS capsule Take 1 capsule (0.4 mg total) by mouth daily. Patient not taking: Reported on 07/05/2014 11/24/13   Bethann BerkshireJoseph Zammit, MD  BP 121/60 mmHg  Pulse 101  Temp(Src) 98.6 F (37 C) (Oral)  Resp 20  SpO2 96% Physical Exam  Constitutional: He is oriented to person, place, and time. He appears well-developed and well-nourished.  HENT:  Head: Normocephalic and atraumatic.  Eyes: Right eye exhibits no discharge. Left eye exhibits no discharge.  Neck: Normal range of motion. Neck supple. No tracheal deviation present.  Cardiovascular: Normal rate.   Pulmonary/Chest:  Effort normal.  Abdominal: Soft.  Musculoskeletal: He exhibits edema and tenderness.  Patient has moderate effusion right knee with tenderness with any range of motion or palpation anterior, no rash, mild warmth, held in flexion for comfort. Neurovascularly intact.  Neurological: He is alert and oriented to person, place, and time.  Skin: Skin is warm. No rash noted.  Psychiatric: He has a normal mood and affect.  Nursing note and vitals reviewed.   ED Course  Procedures (including critical care time) Emergency Ultrasound Study:   Angiocath insertion Performed by: Enid Skeens  Consent: Verbal consent obtained. Risks and benefits: risks, benefits and alternatives were discussed Immediately prior to procedure the correct patient, procedure, equipment, support staff and site/side marked as needed.  Indication: difficult IV access Preparation: Patient was prepped and draped in the usual sterile fashion. Vein Location: right ac vein was visualized during assessment for potential access sites and was found to be patent/ easily compressed with linear ultrasound.  The needle was visualized with real-time ultrasound and guided into the vein. Gauge: 20 g  Image saved and stored.  Normal blood return.  Patient tolerance: Patient tolerated the procedure well with no immediate complications.     Knee aspiration Indication pain and swelling subjective fever history of gout Discussed risks and benefits with the patient, patient agreed to proceed Betadine used to sterilize the skin, ultrasound probe sterilize prior, sterile gloves used. Lidocaine used to numb the area prior to 18-gauge needle ultrasound guided to obtain proximally 50 mL of fluid, light yellow coloration No complications Performed by myself  EMERGENCY DEPARTMENT US SOFT TISSUE INTERPRETATION "Study: Limited Soft Tissue Ultrasound"  INDICATIONS: Pain Multiple views of the body part were obtained in real-time with a  multi-frequency linear probe PERFORMED BY:  Myself IMAGES ARCHIVED?: Yes SIDE:Right  BODY PART:Lower extremity FINDINGS: Other knee effusion INTERPRETATION:  No abcess noted   CPT: Neck 16109-60  Upper extremity 76880-26  Axilla 45409-81  Chest wall 19147-82  Beast 95621-30  Upper back 86578-46  Lower back 96295-28  Abdominal wall 41324-40  Pelvic wall 10272-53  Lower extremity 66440-34  Other soft tissue 74259-56   Labs Review Labs Reviewed  SEDIMENTATION RATE - Abnormal; Notable for the following:    Sed Rate 18 (*)    All other components within normal limits  CBC WITH DIFFERENTIAL/PLATELET - Abnormal; Notable for the following:    WBC 15.3 (*)    Neutrophils Relative % 78 (*)    Neutro Abs 12.1 (*)    Lymphocytes Relative 10 (*)    Monocytes Absolute 1.8 (*)    All other components within normal limits  SYNOVIAL CELL COUNT + DIFF, W/ CRYSTALS - Abnormal; Notable for the following:    WBC, Synovial 38756 (*)    Neutrophil, Synovial 97 (*)    Monocyte-Macrophage-Synovial Fluid 3 (*)    All other components within normal limits  BODY FLUID CULTURE    Imaging Review No results found.   EKG Interpretation None      MDM   Final diagnoses:  Knee effusion,  right  Acute gout of right knee, unspecified cause   Patient presents with knee effusion, subjective fevers and history of gout. No trauma to the knee, concern for gout versus inflammatory reaction from previous injury exacerbation versus less likely septic joint. With subjective fever patient denies ever having this in the past recommended ultrasound-guided knee aspiration. Risks and benefits discussed, patient wishes to proceed as comfortable follow-up closely with orthopedics outpatient. Patient's pain improved in ER. Spoke with Dr. Hilda LiasKeeling on call for orthopedics reviewed results, he can see the patient in the clinic oral, recommended giving the patient uloric in ER and he has samples for the  patient in the office. Recommended also given colchicine Results and differential diagnosis were discussed with the patient/parent/guardian. Close follow up outpatient was discussed, comfortable with the plan.   Medications  febuxostat (ULORIC) tablet 80 mg (not administered)  ketorolac (TORADOL) 30 MG/ML injection 30 mg (30 mg Intravenous Given 07/05/14 0957)  lidocaine (PF) (XYLOCAINE) 1 % injection 30 mL (10 mLs Intradermal Given 07/05/14 0955)  HYDROcodone-acetaminophen (NORCO/VICODIN) 5-325 MG per tablet 2 tablet (2 tablets Oral Given 07/05/14 0956)  morphine 4 MG/ML injection 8 mg (8 mg Intramuscular Given 07/05/14 1056)    Filed Vitals:   07/05/14 0901 07/05/14 1039  BP: 142/82 121/60  Pulse: 102 101  Temp: 98.5 F (36.9 C) 98.6 F (37 C)  TempSrc: Oral Oral  Resp: 20   SpO2: 97% 96%    Final diagnoses:  Knee effusion, right  Acute gout of right knee, unspecified cause        Blane OharaJoshua Ortencia Askari, MD 07/05/14 1212

## 2014-07-05 NOTE — ED Notes (Signed)
Verified with lab; they are indeed working on right knee fluid counts

## 2014-07-05 NOTE — ED Notes (Signed)
Assissted EDP with right knee fluid aspiration.

## 2014-07-05 NOTE — ED Notes (Signed)
PT c/o right knee pain x2 days with no injury and states he thinks he may be gout. PT right knee swollen and has decreased ROM d/t pain.

## 2014-07-05 NOTE — ED Notes (Signed)
Patient with no complaints at this time. Respirations even and unlabored. Skin warm/dry. Discharge instructions reviewed with patient at this time. Patient given opportunity to voice concerns/ask questions. Patient discharged at this time and left Emergency Department with steady gait.   

## 2014-07-07 ENCOUNTER — Emergency Department (HOSPITAL_COMMUNITY)
Admission: EM | Admit: 2014-07-07 | Discharge: 2014-07-07 | Disposition: A | Discharge status: Home / Self Care | Payer: Medicaid Other | Attending: Emergency Medicine | Admitting: Emergency Medicine

## 2014-07-07 ENCOUNTER — Encounter (HOSPITAL_COMMUNITY): Payer: Self-pay | Admitting: *Deleted

## 2014-07-07 DIAGNOSIS — M109 Gout, unspecified: Secondary | ICD-10-CM

## 2014-07-07 DIAGNOSIS — Z87442 Personal history of urinary calculi: Secondary | ICD-10-CM | POA: Insufficient documentation

## 2014-07-07 DIAGNOSIS — Z792 Long term (current) use of antibiotics: Secondary | ICD-10-CM | POA: Insufficient documentation

## 2014-07-07 DIAGNOSIS — M10061 Idiopathic gout, right knee: Secondary | ICD-10-CM | POA: Insufficient documentation

## 2014-07-07 DIAGNOSIS — Z7952 Long term (current) use of systemic steroids: Secondary | ICD-10-CM | POA: Insufficient documentation

## 2014-07-07 DIAGNOSIS — Z720 Tobacco use: Secondary | ICD-10-CM | POA: Insufficient documentation

## 2014-07-07 DIAGNOSIS — Z9889 Other specified postprocedural states: Secondary | ICD-10-CM | POA: Diagnosis not present

## 2014-07-07 DIAGNOSIS — M25561 Pain in right knee: Secondary | ICD-10-CM | POA: Diagnosis present

## 2014-07-07 DIAGNOSIS — Z79899 Other long term (current) drug therapy: Secondary | ICD-10-CM | POA: Insufficient documentation

## 2014-07-07 MED ORDER — PREDNISONE 50 MG PO TABS
60.0000 mg | ORAL_TABLET | Freq: Once | ORAL | Status: AC
  Administered 2014-07-07: 60 mg via ORAL
  Filled 2014-07-07 (×2): qty 1

## 2014-07-07 MED ORDER — OXYCODONE-ACETAMINOPHEN 5-325 MG PO TABS
2.0000 | ORAL_TABLET | Freq: Once | ORAL | Status: AC
  Administered 2014-07-07: 2 via ORAL
  Filled 2014-07-07: qty 2

## 2014-07-07 MED ORDER — OXYCODONE-ACETAMINOPHEN 5-325 MG PO TABS: 1.0000 | ORAL_TABLET | ORAL | Status: DC | PRN

## 2014-07-07 NOTE — Discharge Instructions (Signed)
Gout °Gout is when your joints become red, sore, and swell (inflamed). This is caused by the buildup of uric acid crystals in the joints. Uric acid is a chemical that is normally in the blood. If the level of uric acid gets too high in the blood, these crystals form in your joints and tissues. Over time, these crystals can form into masses near the joints and tissues. These masses can destroy bone and cause the bone to look misshapen (deformed). °HOME CARE  °· Do not take aspirin for pain. °· Only take medicine as told by your doctor. °· Rest the joint as much as you can. When in bed, keep sheets and blankets off painful areas. °· Keep the sore joints raised (elevated). °· Put warm or cold packs on painful joints. Use of warm or cold packs depends on which works best for you. °· Use crutches if the painful joint is in your leg. °· Drink enough fluids to keep your pee (urine) clear or pale yellow. Limit alcohol, sugary drinks, and drinks with fructose in them. °· Follow your diet instructions. Pay careful attention to how much protein you eat. Include fruits, vegetables, whole grains, and fat-free or low-fat milk products in your daily diet. Talk to your doctor or dietitian about the use of coffee, vitamin C, and cherries. These may help lower uric acid levels. °· Keep a healthy body weight. °GET HELP RIGHT AWAY IF:  °· You have watery poop (diarrhea), throw up (vomit), or have any side effects from medicines. °· You do not feel better in 24 hours, or you are getting worse. °· Your joint becomes suddenly more tender, and you have chills or a fever. °MAKE SURE YOU:  °· Understand these instructions. °· Will watch your condition. °· Will get help right away if you are not doing well or get worse. °Document Released: 11/14/2007 Document Revised: 06/21/2013 Document Reviewed: 09/18/2011 °ExitCare® Patient Information ©2015 [employer]. This information is not intended to replace advice given to you by your health care  provider. Make sure you discuss any questions you have with your health care provider. ° °

## 2014-07-07 NOTE — ED Notes (Signed)
Pain rt knee, says he has gout, Seen here 5/17 for same.  Needs med for pain

## 2014-07-08 LAB — BODY FLUID CULTURE: Culture: NO GROWTH

## 2014-07-09 NOTE — ED Provider Notes (Signed)
CSN: [id number]     Arrival date & time 07/07/14  1344 History   First MD Initiated Contact with Patient 07/07/14 1354     Chief Complaint  Patient presents with  . Knee Pain     (Consider location/radiation/quality/duration/timing/severity/associated sxs/prior Treatment) HPI  Chad Weaver is a 24 y.o. male who presents to the Emergency Department complaining of continued right knee pain and requests additional pain medication.  He states that he was seen here two days ago and had his knee tapped.  Has hx of gout and states the pain feels similar to previous gout attacks.  He was advised to f/u with Dr. Hilda LiasKeeling, but states he could not be seen due to a problem with his Medicaid card.  He has ran out of pain medication.  C/o continued pain with bending and weight bearing.  He denies fever, chills, redness or increased swelling.    Past Medical History  Diagnosis Date  . Gout   . Kidney stones   . H/O knee surgery    Past Surgical History  Procedure Laterality Date  . Anterior cruciate ligament repair    . Tympanostomy tube placement     History reviewed. No pertinent family history. History  Substance Use Topics  . Smoking status: Current Every Day Smoker -- 0.50 packs/day    Types: Cigarettes  . Smokeless tobacco: Not on file  . Alcohol Use: No    Review of Systems  Constitutional: Negative for fever and chills.  Gastrointestinal: Negative for vomiting.  Genitourinary: Negative for dysuria and difficulty urinating.  Musculoskeletal: Positive for joint swelling and arthralgias (right knee pain).  Skin: Negative for color change and wound.  All other systems reviewed and are negative.     Allergies  Sulfonamide derivatives  Home Medications   Prior to Admission medications   Medication Sig Start Date End Date Taking? Authorizing Provider  colchicine 0.6 MG tablet Take 1 tablet (0.6 mg total) by mouth 3 (three) times daily. 07/05/14  Yes Blane OharaJoshua Zavitz, MD   HYDROcodone-acetaminophen (NORCO) 5-325 MG per tablet Take 2 tablets by mouth every 4 (four) hours as needed. 07/05/14  Yes Blane OharaJoshua Zavitz, MD  cephALEXin (KEFLEX) 500 MG capsule Take 1 capsule (500 mg total) by mouth 3 (three) times daily. Patient not taking: Reported on 07/05/2014 11/24/13   Bethann BerkshireJoseph Zammit, MD  colchicine 0.6 MG tablet Take 1 tablet (0.6 mg total) by mouth daily. Patient not taking: Reported on 07/05/2014 03/06/14   Donnetta HutchingBrian Cook, MD  indomethacin (INDOCIN) 25 MG capsule Take 1 capsule (25 mg total) by mouth 3 (three) times daily as needed. Patient not taking: Reported on 07/05/2014 03/06/14   Donnetta HutchingBrian Cook, MD  oxyCODONE-acetaminophen (PERCOCET/ROXICET) 5-325 MG per tablet Take 1 tablet by mouth every 4 (four) hours as needed. 07/07/14   Zhoey Blackstock, PA-C  predniSONE (DELTASONE) 50 MG tablet 1 tablet daily for 6 days, one half tablet daily for 6 days Patient not taking: Reported on 07/05/2014 03/06/14   Donnetta HutchingBrian Cook, MD  promethazine (PHENERGAN) 25 MG tablet Take 1 tablet (25 mg total) by mouth every 6 (six) hours as needed for nausea or vomiting. Patient not taking: Reported on 07/05/2014 11/24/13   Bethann BerkshireJoseph Zammit, MD  tamsulosin (FLOMAX) 0.4 MG CAPS capsule Take 1 capsule (0.4 mg total) by mouth daily. Patient not taking: Reported on 07/05/2014 11/24/13   Bethann BerkshireJoseph Zammit, MD   BP 124/79 mmHg  Pulse 87  Temp(Src) 97.9 F (36.6 C) (Oral)  Resp 14  Ht 5'  11" (1.803 m)  Wt 250 lb (113.399 kg)  BMI 34.88 kg/m2  SpO2 100% Physical Exam  Constitutional: He is oriented to person, place, and time. He appears well-developed and well-nourished. No distress.  Cardiovascular: Normal rate, regular rhythm, normal heart sounds and intact distal pulses.   Pulmonary/Chest: Effort normal and breath sounds normal.  Musculoskeletal: He exhibits edema and tenderness.  Diffuse ttp of the right knee.  No erythema, effusion, or step-off deformity.  DP pulse brisk, distal sensation intact. Calf is soft and NT.   Neurological: He is alert and oriented to person, place, and time. He exhibits normal muscle tone. Coordination normal.  Skin: Skin is warm and dry. No erythema.  Nursing note and vitals reviewed.   ED Course  Procedures (including critical care time) Labs Review Labs Reviewed - No data to display  Imaging Review No results found.   EKG Interpretation None      MDM   Final diagnoses:  Right knee pain  Gout of right knee, unspecified cause, unspecified chronicity   Previous ed chart and results reviewed.    Pt seen here two days ago, had joint aspiration performed, results c/w gout.  Pt returned because he is unable to f/u with orthopedic due to problem with his medicaid card.  Ran out of pain medication.  He agrees to try to arrange f/u with another orthopedist and appears stable for d/c.      Pauline Aus, PA-C 07/09/14 1804  Rolland Porter, MD 07/16/14 [phone]

## 2014-12-01 ENCOUNTER — Emergency Department (HOSPITAL_COMMUNITY)
Admission: EM | Admit: 2014-12-01 | Discharge: 2014-12-01 | Disposition: A | Discharge status: Home / Self Care | Payer: Medicaid Other | Attending: Emergency Medicine | Admitting: Emergency Medicine

## 2014-12-01 ENCOUNTER — Encounter (HOSPITAL_COMMUNITY): Payer: Self-pay | Admitting: *Deleted

## 2014-12-01 DIAGNOSIS — M109 Gout, unspecified: Secondary | ICD-10-CM | POA: Diagnosis present

## 2014-12-01 DIAGNOSIS — M10072 Idiopathic gout, left ankle and foot: Secondary | ICD-10-CM | POA: Insufficient documentation

## 2014-12-01 DIAGNOSIS — Z87442 Personal history of urinary calculi: Secondary | ICD-10-CM | POA: Diagnosis not present

## 2014-12-01 DIAGNOSIS — Z79899 Other long term (current) drug therapy: Secondary | ICD-10-CM | POA: Diagnosis not present

## 2014-12-01 MED ORDER — COLCHICINE 0.6 MG PO TABS: 0.6000 mg | ORAL_TABLET | Freq: Three times a day (TID) | ORAL | Status: DC

## 2014-12-01 MED ORDER — HYDROCODONE-ACETAMINOPHEN 5-325 MG PO TABS: 2.0000 | ORAL_TABLET | ORAL | Status: DC | PRN

## 2014-12-01 MED ORDER — PREDNISONE 10 MG (21) PO TBPK: ORAL_TABLET | ORAL | Status: DC

## 2014-12-01 NOTE — ED Notes (Signed)
Pt states flare up of gout began last night to left foot. No known injury

## 2014-12-01 NOTE — Discharge Instructions (Signed)

## 2014-12-01 NOTE — ED Provider Notes (Signed)
CSN: [id number]     Arrival date & time 12/01/14  1036 History   First MD Initiated Contact with Patient 12/01/14 1108     Chief Complaint  Patient presents with  . Gout     (Consider location/radiation/quality/duration/timing/severity/associated sxs/prior Treatment) Patient is a 24 y.o. male presenting with lower extremity pain. The history is provided by the patient. No language interpreter was used.  Foot Pain This is a new problem. The current episode started today. The problem occurs constantly. The problem has been gradually worsening. Associated symptoms include myalgias. Nothing aggravates the symptoms. He has tried nothing for the symptoms. The treatment provided moderate relief.    Past Medical History  Diagnosis Date  . Gout   . Kidney stones   . H/O knee surgery    Past Surgical History  Procedure Laterality Date  . Anterior cruciate ligament repair    . Tympanostomy tube placement     No family history on file. Social History  Substance Use Topics  . Smoking status: Never Smoker   . Smokeless tobacco: Current User    Types: Snuff  . Alcohol Use: No    Review of Systems  Musculoskeletal: Positive for myalgias.  All other systems reviewed and are negative.     Allergies  Sulfonamide derivatives  Home Medications   Prior to Admission medications   Medication Sig Start Date End Date Taking? Authorizing Provider  colchicine 0.6 MG tablet Take 1 tablet (0.6 mg total) by mouth 3 (three) times daily. 12/01/14   Elson AreasLeslie K Kevin Mario, PA-C  HYDROcodone-acetaminophen (NORCO/VICODIN) 5-325 MG tablet Take 2 tablets by mouth every 4 (four) hours as needed. 12/01/14   Elson AreasLeslie K Jeree Delcid, PA-C  oxyCODONE-acetaminophen (PERCOCET/ROXICET) 5-325 MG per tablet Take 1 tablet by mouth every 4 (four) hours as needed. Patient not taking: Reported on 12/01/2014 07/07/14   Tammy Triplett, PA-C  predniSONE (STERAPRED UNI-PAK 21 TAB) 10 MG (21) TBPK tablet 6,5,4,3,2,1 taper 12/01/14    Lonia SkinnerLeslie K Bowie Delia, PA-C   BP 129/78 mmHg  Pulse 93  Temp(Src) 97.8 F (36.6 C) (Oral)  Resp 16  Ht 5\' 10"  (1.778 m)  Wt 260 lb (117.935 kg)  BMI 37.31 kg/m2  SpO2 98% Physical Exam  Constitutional: He is oriented to person, place, and time. He appears well-developed and well-nourished.  Musculoskeletal: He exhibits tenderness.  Swollen tender left 1st toe, nv and ns intact  Neurological: He is alert and oriented to person, place, and time.  Skin: Skin is warm.  Psychiatric: He has a normal mood and affect.  Nursing note and vitals reviewed.   ED Course  Procedures (including critical care time) Labs Review Labs Reviewed - No data to display  Imaging Review No results found. I have personally reviewed and evaluated these images and lab results as part of my medical decision-making.   EKG Interpretation None      MDM Pt using chewing tobacco.  Pt counseled on need to stop and risk   Final diagnoses:  Acute gout involving toe of left foot, unspecified cause    Hydrocodone Colchicine Prednisone taper    Elson AreasLeslie K Graceann Boileau, PA-C 12/01/14 1811  Vanetta MuldersScott Zackowski, MD 12/02/14 [phone]

## 2015-05-04 ENCOUNTER — Ambulatory Visit: Payer: Self-pay | Admitting: Family Medicine

## 2015-05-10 ENCOUNTER — Emergency Department (HOSPITAL_COMMUNITY)
Admission: EM | Admit: 2015-05-10 | Discharge: 2015-05-10 | Disposition: A | Discharge status: Home / Self Care | Payer: Medicaid Other | Attending: Emergency Medicine | Admitting: Emergency Medicine

## 2015-05-10 ENCOUNTER — Encounter (HOSPITAL_COMMUNITY): Payer: Self-pay | Admitting: Emergency Medicine

## 2015-05-10 DIAGNOSIS — M25561 Pain in right knee: Secondary | ICD-10-CM | POA: Insufficient documentation

## 2015-05-10 DIAGNOSIS — M79672 Pain in left foot: Secondary | ICD-10-CM | POA: Insufficient documentation

## 2015-05-10 MED ORDER — PREDNISONE 10 MG (21) PO TBPK: ORAL_TABLET | ORAL | Status: DC

## 2015-05-10 MED ORDER — INDOMETHACIN 25 MG PO CAPS: 25.0000 mg | ORAL_CAPSULE | Freq: Three times a day (TID) | ORAL | Status: DC | PRN

## 2015-05-10 MED ORDER — HYDROCODONE-ACETAMINOPHEN 5-325 MG PO TABS: 2.0000 | ORAL_TABLET | ORAL | Status: DC | PRN

## 2015-05-10 NOTE — ED Notes (Signed)
Pain to right knee, history of gout.  Rate pain 10/10, did not take any pain medication.

## 2015-05-10 NOTE — ED Provider Notes (Signed)
CSN: [id number]     Arrival date & time 05/10/15  1351 History   First MD Initiated Contact with Patient 05/10/15 1439     Chief Complaint  Patient presents with  . Knee Pain     (Consider location/radiation/quality/duration/timing/severity/associated sxs/prior Treatment) Patient is a 25 y.o. male presenting with knee pain. The history is provided by the patient. No language interpreter was used.  Knee Pain Location:  Knee Injury: no   Knee location:  R knee Pain details:    Quality:  Aching   Radiates to:  Does not radiate   Severity:  Moderate   Onset quality:  Gradual   Timing:  Constant   Progression:  Worsening Chronicity:  New Dislocation: no   Foreign body present:  No foreign bodies Relieved by:  Nothing Worsened by:  Nothing tried Ineffective treatments:  None tried Associated symptoms: swelling   Pt complains of pain in right knee and left foot  Past Medical History  Diagnosis Date  . Gout   . Kidney stones   . H/O knee surgery    Past Surgical History  Procedure Laterality Date  . Anterior cruciate ligament repair    . Tympanostomy tube placement     History reviewed. No pertinent family history. Social History  Substance Use Topics  . Smoking status: Never Smoker   . Smokeless tobacco: Current User    Types: Snuff  . Alcohol Use: No    Review of Systems  All other systems reviewed and are negative.     Allergies  Sulfonamide derivatives  Home Medications   Prior to Admission medications   Medication Sig Start Date End Date Taking? Authorizing Provider  colchicine 0.6 MG tablet Take 1 tablet (0.6 mg total) by mouth 3 (three) times daily. Patient not taking: Reported on 05/10/2015 12/01/14   Elson AreasLeslie K Shemeca Lukasik, PA-C  HYDROcodone-acetaminophen (NORCO/VICODIN) 5-325 MG tablet Take 2 tablets by mouth every 4 (four) hours as needed. 05/10/15   Elson AreasLeslie K Zaylee Cornia, PA-C  indomethacin (INDOCIN) 25 MG capsule Take 1 capsule (25 mg total) by mouth 3 (three)  times daily as needed. 05/10/15   Elson AreasLeslie K Aylee Littrell, PA-C  predniSONE (STERAPRED UNI-PAK 21 TAB) 10 MG (21) TBPK tablet 6,5,4,3,2,1 taper 05/10/15   Lonia SkinnerLeslie K Nilani Hugill, PA-C   BP 139/72 mmHg  Pulse 120  Temp(Src) 98.6 F (37 C) (Oral)  Resp 16  Ht 5\' 11"  (1.803 m)  Wt 113.399 kg  BMI 34.88 kg/m2  SpO2 100% Physical Exam  Constitutional: He is oriented to person, place, and time. He appears well-developed and well-nourished.  HENT:  Head: Normocephalic and atraumatic.  Right Ear: External ear normal.  Nose: Nose normal.  Mouth/Throat: Oropharynx is clear and moist.  Eyes: Conjunctivae and EOM are normal. Pupils are equal, round, and reactive to light.  Neck: Normal range of motion.  Cardiovascular: Normal rate and normal heart sounds.   Pulmonary/Chest: Effort normal.  Abdominal: He exhibits no distension.  Musculoskeletal: He exhibits tenderness.  Tender right knee,  Swollen left foot  Pain with movement.  Neurological: He is alert and oriented to person, place, and time.  Psychiatric: He has a normal mood and affect.  Nursing note and vitals reviewed.   ED Course  Procedures (including critical care time) Labs Review Labs Reviewed - No data to display  Imaging Review No results found. I have personally reviewed and evaluated these images and lab results as part of my medical decision-making.   EKG Interpretation None  MDM Pt given rx for gout.  Pt advised to follow up with Dr. Gerda Diss   Final diagnoses:  Knee pain, right  Foot pain, left   Meds ordered this encounter  Medications  . predniSONE (STERAPRED UNI-PAK 21 TAB) 10 MG (21) TBPK tablet    Sig: 6,5,4,3,2,1 taper    Dispense:  21 tablet    Refill:  0    Order Specific Question:  Supervising Provider    Answer:  MILLER, BRIAN [[id number]]  . HYDROcodone-acetaminophen (NORCO/VICODIN) 5-325 MG tablet    Sig: Take 2 tablets by mouth every 4 (four) hours as needed.    Dispense:  20 tablet    Refill:  0    Order  Specific Question:  Supervising Provider    Answer:  MILLER, BRIAN [[id number]]  . indomethacin (INDOCIN) 25 MG capsule    Sig: Take 1 capsule (25 mg total) by mouth 3 (three) times daily as needed.    Dispense:  30 capsule    Refill:  0    Order Specific Question:  Supervising Provider    Answer:  Eber Hong [[id number]]  An After Visit Summary was printed and given to the patient.  Lonia Skinner [place], PA-C 05/10/15 1541  Vanetta Mulders, MD 05/10/15 [phone]

## 2015-05-10 NOTE — Discharge Instructions (Signed)

## 2015-05-13 ENCOUNTER — Encounter (HOSPITAL_COMMUNITY): Payer: Self-pay | Admitting: Emergency Medicine

## 2015-05-13 ENCOUNTER — Emergency Department (HOSPITAL_COMMUNITY)
Admission: EM | Admit: 2015-05-13 | Discharge: 2015-05-13 | Disposition: A | Discharge status: Home / Self Care | Payer: Medicaid Other | Attending: Emergency Medicine | Admitting: Emergency Medicine

## 2015-05-13 DIAGNOSIS — H6692 Otitis media, unspecified, left ear: Secondary | ICD-10-CM

## 2015-05-13 MED ORDER — AMOXICILLIN 500 MG PO CAPS: 500.0000 mg | ORAL_CAPSULE | Freq: Three times a day (TID) | ORAL | Status: DC

## 2015-05-13 MED ORDER — HYDROCODONE-ACETAMINOPHEN 5-325 MG PO TABS
1.0000 | ORAL_TABLET | Freq: Once | ORAL | Status: AC
  Administered 2015-05-13: 1 via ORAL
  Filled 2015-05-13: qty 1

## 2015-05-13 MED ORDER — FLUTICASONE PROPIONATE 50 MCG/ACT NA SUSP: NASAL | Status: DC

## 2015-05-13 MED ORDER — AMOXICILLIN 250 MG PO CAPS
500.0000 mg | ORAL_CAPSULE | Freq: Once | ORAL | Status: AC
  Administered 2015-05-13: 500 mg via ORAL
  Filled 2015-05-13: qty 2

## 2015-05-13 NOTE — Discharge Instructions (Signed)
Take Sudafed, over-the-counter ,morning, and early afternoon. Motrin or Tylenol for pain. Amoxicillin, as prescribed Flonase, one spray each nares twice per day.   Otitis Media, Adult Otitis media is redness, soreness, and puffiness (swelling) in the space just behind your eardrum (middle ear). It may be caused by allergies or infection. It often happens along with a cold. HOME CARE  Take your medicine as told. Finish it even if you start to feel better.  Only take over-the-counter or prescription medicines for pain, discomfort, or fever as told by your doctor.  Follow up with your doctor as told. GET HELP IF:  You have otitis media only in one ear, or bleeding from your nose, or both.  You notice a lump on your neck.  You are not getting better in 3-5 days.  You feel worse instead of better. GET HELP RIGHT AWAY IF:   You have pain that is not helped with medicine.  You have puffiness, redness, or pain around your ear.  You get a stiff neck.  You cannot move part of your face (paralysis).  You notice that the bone behind your ear hurts when you touch it. MAKE SURE YOU:   Understand these instructions.  Will watch your condition.  Will get help right away if you are not doing well or get worse.   This information is not intended to replace advice given to you by your health care provider. Make sure you discuss any questions you have with your health care provider.   Document Released: 07/24/2007 Document Revised: 02/25/2014 Document Reviewed: 09/01/2012 Elsevier Interactive Patient Education [employer]2016 [employer]vier Inc.

## 2015-05-13 NOTE — ED Notes (Signed)
Patient c/o left ear pain. Denies any fevers or drainage. Per patient hard to hear out of ear yesterday and pain started last night.

## 2015-05-13 NOTE — ED Provider Notes (Signed)
CSN: [id number]     Arrival date & time 05/13/15  1209 History   First MD Initiated Contact with Patient 05/13/15 1324     Chief Complaint  Patient presents with  . Otalgia     HPI  Patient presents for evaluation of left ear pain. States he had pressure in his ear and couldn't hear well from yesterday. Became painful during the night last night and thus he presents here.  Past Medical History  Diagnosis Date  . Gout   . Kidney stones   . H/O knee surgery    Past Surgical History  Procedure Laterality Date  . Anterior cruciate ligament repair    . Tympanostomy tube placement     History reviewed. No pertinent family history. Social History  Substance Use Topics  . Smoking status: Never Smoker   . Smokeless tobacco: Current User    Types: Snuff  . Alcohol Use: No    Review of Systems  Constitutional: Negative for fever, chills, diaphoresis, appetite change and fatigue.  HENT: Positive for congestion and ear pain. Negative for mouth sores, sore throat and trouble swallowing.   Eyes: Negative for visual disturbance.  Respiratory: Negative for cough, chest tightness, shortness of breath and wheezing.   Cardiovascular: Negative for chest pain.  Gastrointestinal: Negative for nausea, vomiting, abdominal pain, diarrhea and abdominal distention.  Endocrine: Negative for polydipsia, polyphagia and polyuria.  Genitourinary: Negative for dysuria, frequency and hematuria.  Musculoskeletal: Negative for gait problem.  Skin: Negative for color change, pallor and rash.  Neurological: Negative for dizziness, syncope, light-headedness and headaches.  Hematological: Does not bruise/bleed easily.  Psychiatric/Behavioral: Negative for behavioral problems and confusion.      Allergies  Sulfonamide derivatives  Home Medications   Prior to Admission medications   Medication Sig Start Date End Date Taking? Authorizing Provider  amoxicillin (AMOXIL) 500 MG capsule Take 1 capsule (500  mg total) by mouth 3 (three) times daily. 05/13/15   Rolland Porter, MD  colchicine 0.6 MG tablet Take 1 tablet (0.6 mg total) by mouth 3 (three) times daily. Patient not taking: Reported on 05/10/2015 12/01/14   Elson Areas, PA-C  fluticasone [facility]) 50 MCG/ACT nasal spray 1 spray each nares twice a day 05/13/15   Rolland Porter, MD  HYDROcodone-acetaminophen (NORCO/VICODIN) 5-325 MG tablet Take 2 tablets by mouth every 4 (four) hours as needed. Patient not taking: Reported on 05/13/2015 05/10/15   Elson Areas, PA-C  indomethacin (INDOCIN) 25 MG capsule Take 1 capsule (25 mg total) by mouth 3 (three) times daily as needed. Patient not taking: Reported on 05/13/2015 05/10/15   Elson Areas, PA-C  predniSONE (STERAPRED UNI-PAK 21 TAB) 10 MG (21) TBPK tablet 6,5,4,3,2,1 taper Patient not taking: Reported on 05/13/2015 05/10/15   Lonia Skinner Sofia, PA-C   BP 134/85 mmHg  Pulse 88  Temp(Src) 97.5 F (36.4 C) (Temporal)  Resp 16  Ht  (1.803 m)  Wt 240 lb (108.863 kg)  BMI 33.49 kg/m2  SpO2 99% Physical Exam  Constitutional: He is oriented to person, place, and time. He appears well-developed and well-nourished. No distress.  HENT:  Head: Normocephalic.  Left TM red, bulging, injected. Not perforated. Normal canal.  Eyes: Conjunctivae are normal. Pupils are equal, round, and reactive to light. No scleral icterus.  Neck: Normal range of motion. Neck supple. No thyromegaly present.  Cardiovascular: Normal rate and regular rhythm.  Exam reveals no gallop and no friction rub.   No murmur heard. Pulmonary/Chest: Effort normal  and breath sounds normal. No respiratory distress. He has no wheezes. He has no rales.  Abdominal: Soft. Bowel sounds are normal. He exhibits no distension. There is no tenderness. There is no rebound.  Musculoskeletal: Normal range of motion.  Neurological: He is alert and oriented to person, place, and time.  Skin: Skin is warm and dry. No rash noted.  Psychiatric: He has a  normal mood and affect. His behavior is normal.    ED Course  Procedures (including critical care time) Labs Review Labs Reviewed - No data to display  Imaging Review No results found. I have personally reviewed and evaluated these images and lab results as part of my medical decision-making.   EKG Interpretation None      MDM   Final diagnoses:  Acute left otitis media, recurrence not specified, unspecified otitis media type    Amoxicillin. Given Vicodin here. Over-the-counter Sudafed recommended. Flonase.    Rolland PorterMark Maisee Vollman, MD 05/13/15 1336

## 2015-09-26 ENCOUNTER — Encounter (HOSPITAL_COMMUNITY): Payer: Self-pay | Admitting: Emergency Medicine

## 2015-09-26 ENCOUNTER — Emergency Department (HOSPITAL_COMMUNITY)
Admission: EM | Admit: 2015-09-26 | Discharge: 2015-09-26 | Disposition: A | Discharge status: Home / Self Care | Payer: Medicaid Other | Attending: Emergency Medicine | Admitting: Emergency Medicine

## 2015-09-26 DIAGNOSIS — M109 Gout, unspecified: Secondary | ICD-10-CM | POA: Diagnosis not present

## 2015-09-26 DIAGNOSIS — F17210 Nicotine dependence, cigarettes, uncomplicated: Secondary | ICD-10-CM | POA: Diagnosis not present

## 2015-09-26 DIAGNOSIS — M79671 Pain in right foot: Secondary | ICD-10-CM | POA: Diagnosis present

## 2015-09-26 DIAGNOSIS — Z79899 Other long term (current) drug therapy: Secondary | ICD-10-CM | POA: Diagnosis not present

## 2015-09-26 DIAGNOSIS — F17290 Nicotine dependence, other tobacco product, uncomplicated: Secondary | ICD-10-CM | POA: Insufficient documentation

## 2015-09-26 MED ORDER — COLCHICINE 0.6 MG PO TABS
1.2000 mg | ORAL_TABLET | Freq: Once | ORAL | Status: AC
  Administered 2015-09-26: 1.2 mg via ORAL
  Filled 2015-09-26: qty 2

## 2015-09-26 MED ORDER — PREDNISONE 50 MG PO TABS
60.0000 mg | ORAL_TABLET | Freq: Once | ORAL | Status: AC
  Administered 2015-09-26: 60 mg via ORAL
  Filled 2015-09-26: qty 1

## 2015-09-26 MED ORDER — COLCHICINE 0.6 MG PO TABS
0.6000 mg | ORAL_TABLET | Freq: Once | ORAL | Status: AC
  Administered 2015-09-26: 0.6 mg via ORAL
  Filled 2015-09-26: qty 1

## 2015-09-26 MED ORDER — PREDNISONE 10 MG PO TABS: ORAL_TABLET | ORAL | 0 refills | Status: DC

## 2015-09-26 MED ORDER — OXYCODONE-ACETAMINOPHEN 5-325 MG PO TABS: 1.0000 | ORAL_TABLET | ORAL | 0 refills | Status: DC | PRN

## 2015-09-26 MED ORDER — OXYCODONE-ACETAMINOPHEN 5-325 MG PO TABS
1.0000 | ORAL_TABLET | Freq: Once | ORAL | Status: AC
  Administered 2015-09-26: 1 via ORAL
  Filled 2015-09-26: qty 1

## 2015-09-26 NOTE — ED Triage Notes (Signed)
Patient complaining of pain to right foot. States he has history of gout in same foot "and it feels the same as when I had it before." Denies injury.

## 2015-09-26 NOTE — Discharge Instructions (Signed)
You may take the percocet prescribed for pain relief.  This will make you drowsy - do not drive within 4 hours of taking this medication. ° °

## 2015-09-26 NOTE — ED Provider Notes (Signed)
[facility] DEPT Provider Note   CSN: [id number] Arrival date & time: 09/26/15  1851  First Provider Contact:  First MD Initiated Contact with Patient 09/26/15 1926        History   Chief Complaint Chief Complaint  Patient presents with  . Foot Pain    HPI Chad Weaver is a 25 y.o. male.  Chad Weaver is a 25 y.o. Male presenting with a 2 day history of increasing pain, redness and swelling of his right dorsal foot which are his chronic intermittent symptoms of a gouty flare.  His last episode occurred about 4 months ago.  He denies injury to the foot, also denies fevers, chills, nausea, calf or leg swelling or shortness of breath. He has applied heat and used elevation without much improvement in his symptoms.       The history is provided by the patient.    Past Medical History:  Diagnosis Date  . Gout   . H/O knee surgery   . Kidney stones     Patient Active Problem List   Diagnosis Date Noted  . 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 JOINT EFFUSION, KNEE 09/29/2007  . KNEE PAIN 09/29/2007  . TEAR A C L 12/09/2006    Past Surgical History:  Procedure Laterality Date  . ADENOIDECTOMY    . ANTERIOR CRUCIATE LIGAMENT REPAIR    . TYMPANOSTOMY TUBE PLACEMENT         Home Medications    Prior to Admission medications   Medication Sig Start Date End Date Taking? Authorizing Provider  amoxicillin (AMOXIL) 500 MG capsule Take 1 capsule (500 mg total) by mouth 3 (three) times daily. 05/13/15   Rolland Porter, MD  colchicine 0.6 MG tablet Take 1 tablet (0.6 mg total) by mouth 3 (three) times daily. Patient not taking: Reported on 05/10/2015 12/01/14   Elson Areas, PA-C  fluticasone [facility]) 50 MCG/ACT nasal spray 1 spray each nares twice a day 05/13/15   Rolland Porter, MD  HYDROcodone-acetaminophen (NORCO/VICODIN) 5-325 MG tablet Take 2 tablets by mouth every 4 (four) hours as needed. Patient not taking: Reported on 05/13/2015 05/10/15   Elson Areas, PA-C  indomethacin (INDOCIN) 25 MG capsule Take 1 capsule (25 mg total) by mouth 3 (three) times daily as needed. Patient not taking: Reported on 05/13/2015 05/10/15   Elson Areas, PA-C  oxyCODONE-acetaminophen (PERCOCET/ROXICET) 5-325 MG tablet Take 1 tablet by mouth every 4 (four) hours as needed. 09/26/15   Burgess Amor, PA-C  predniSONE (DELTASONE) 10 MG tablet 6, 5, 4, 3, 2 then 1 tablet by mouth daily for 6 days total. 09/27/15   Burgess Amor, PA-C    Family History History reviewed. No pertinent family history.  Social History Social History  Substance Use Topics  . Smoking status: Current Some Day Smoker    Packs/day: 0.00    Types: Cigarettes  . Smokeless tobacco: Current User    Types: Snuff  . Alcohol use No     Allergies   Sulfonamide derivatives   Review of Systems Review of Systems  Constitutional: Negative for chills and fever.  Musculoskeletal: Positive for arthralgias and joint swelling. Negative for myalgias.  Neurological: Negative for weakness and numbness.     Physical Exam Updated Vital Signs BP 149/91 (BP Location: Right Arm)   Pulse 109   Temp 98.7 F (37.1 C) (Oral)   Resp 16   Ht  (1.778 m)   Wt 108.9 kg  SpO2 99%   BMI 34.44 kg/m   Physical Exam  Constitutional: He appears well-developed and well-nourished.  HENT:  Head: Atraumatic.  Neck: Normal range of motion.  Cardiovascular:  Pulses equal bilaterally  Musculoskeletal: He exhibits tenderness.       Right foot: There is tenderness and swelling. There is normal capillary refill, no crepitus and no deformity.  ttp with mild erythema and edema right midfoot.  Distal sensation intact with normal pedal pulses.  Calf nontender.  Skin intact with no red streaking.    Neurological: He is alert. He has normal strength. He displays normal reflexes. No sensory deficit.  Skin: Skin is warm and dry.  Psychiatric: He has a normal mood and affect.     ED Treatments / Results    Labs (all labs ordered are listed, but only abnormal results are displayed) Labs Reviewed - No data to display  EKG  EKG Interpretation None       Radiology No results found.  Procedures Procedures (including critical care time)  Medications Ordered in ED Medications  predniSONE (DELTASONE) tablet 60 mg (not administered)  oxyCODONE-acetaminophen (PERCOCET/ROXICET) 5-325 MG per tablet 1 tablet (not administered)  colchicine tablet 1.2 mg (not administered)  colchicine tablet 0.6 mg (not administered)     Initial Impression / Assessment and Plan / ED Course  I have reviewed the triage vital signs and the nursing notes.  Pertinent labs & imaging results that were available during my care of the patient were reviewed by me and considered in my medical decision making (see chart for details).  Clinical Course    With acute gouty flare.  He reports prednisone does a good job with these flares, he was started on a 6 day taper of this medication.  Prescribed oxycodone for pain.  He was given 1.2 mg colchicine here additionally with a 0.6 mg tablet to take in one hour.  Advised follow-up with his PCP for any worsening or persistent symptoms.  Final Clinical Impressions(s) / ED Diagnoses   Final diagnoses:  Acute gout of right foot, unspecified cause    New Prescriptions New Prescriptions   OXYCODONE-ACETAMINOPHEN (PERCOCET/ROXICET) 5-325 MG TABLET    Take 1 tablet by mouth every 4 (four) hours as needed.   PREDNISONE (DELTASONE) 10 MG TABLET    6, 5, 4, 3, 2 then 1 tablet by mouth daily for 6 days total.     Burgess AmorJulie Selita Staiger, PA-C 09/26/15 1947    Maia PlanJoshua G Long, MD 09/27/15 1027

## 2015-10-13 ENCOUNTER — Emergency Department (HOSPITAL_COMMUNITY)
Admission: EM | Admit: 2015-10-13 | Discharge: 2015-10-14 | Disposition: A | Discharge status: Home / Self Care | Payer: Medicaid Other | Attending: Emergency Medicine | Admitting: Emergency Medicine

## 2015-10-13 ENCOUNTER — Encounter (HOSPITAL_COMMUNITY): Payer: Self-pay | Admitting: Emergency Medicine

## 2015-10-13 DIAGNOSIS — F17210 Nicotine dependence, cigarettes, uncomplicated: Secondary | ICD-10-CM | POA: Diagnosis not present

## 2015-10-13 DIAGNOSIS — F1110 Opioid abuse, uncomplicated: Secondary | ICD-10-CM | POA: Insufficient documentation

## 2015-10-13 DIAGNOSIS — Z0000 Encounter for general adult medical examination without abnormal findings: Secondary | ICD-10-CM | POA: Diagnosis present

## 2015-10-13 MED ORDER — CLONIDINE HCL 0.1 MG PO TABS: 0.1000 mg | ORAL_TABLET | Freq: Three times a day (TID) | ORAL | 0 refills | Status: DC

## 2015-10-13 MED ORDER — PROMETHAZINE HCL 25 MG PO TABS: 25.0000 mg | ORAL_TABLET | Freq: Four times a day (QID) | ORAL | 0 refills | Status: DC | PRN

## 2015-10-13 NOTE — ED Triage Notes (Signed)
Pt states he needs help getting off opioids.

## 2015-10-13 NOTE — ED Notes (Signed)
Used heroin 4 hours ago

## 2015-10-13 NOTE — Discharge Instructions (Signed)
See the referral list of outpatient and inpatient treatment centers who you can contact for assistance with your addiction.  The medicines prescribed will help you with withdrawal symptoms.

## 2015-10-13 NOTE — ED Notes (Signed)
Uses pills and  Heroin x 6 year, Went to rehab 1 year ago and was clean 3 to 4 months.

## 2015-10-13 NOTE — ED Provider Notes (Signed)
[facility] Provider Note   CSN: [id number] Arrival date & time: 10/13/15  2210     History   Chief Complaint Chief Complaint  Patient presents with  . Medical Clearance    HPI Chad Weaver is a 25 y.o. male presenting requesting assistance with narcotic abuse, stating he uses opioid pills and injects heroin, last use was 4 hours ago.  He denies any physical symptoms at this time but requests assistance getting off these drugs.  He was last admitted to [facility] one year ago and was clean for 3-4 months.  He called them today but they do not accept his current insurance.  He does not drink alcohol or abuse any other medicines.  He denies homicidal or suicidal ideation.   The history is provided by the patient.    Past Medical History:  Diagnosis Date  . Gout   . H/O knee surgery   . Kidney stones     Patient Active Problem List   Diagnosis Date Noted  . 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 JOINT EFFUSION, KNEE 09/29/2007  . KNEE PAIN 09/29/2007  . TEAR A C L 12/09/2006    Past Surgical History:  Procedure Laterality Date  . ADENOIDECTOMY    . ANTERIOR CRUCIATE LIGAMENT REPAIR    . TYMPANOSTOMY TUBE PLACEMENT         Home Medications    Prior to Admission medications   Medication Sig Start Date End Date Taking? Authorizing Provider  amoxicillin (AMOXIL) 500 MG capsule Take 1 capsule (500 mg total) by mouth 3 (three) times daily. 05/13/15   Rolland Porter, MD  cloNIDine (CATAPRES) 0.1 MG tablet Take 1 tablet (0.1 mg total) by mouth 3 (three) times daily. 10/13/15   Burgess Amor, PA-C  colchicine 0.6 MG tablet Take 1 tablet (0.6 mg total) by mouth 3 (three) times daily. Patient not taking: Reported on 05/10/2015 12/01/14   Elson Areas, PA-C  fluticasone [facility]) 50 MCG/ACT nasal spray 1 spray each nares twice a day 05/13/15   Rolland Porter, MD  HYDROcodone-acetaminophen (NORCO/VICODIN) 5-325 MG tablet Take 2 tablets by mouth every 4 (four) hours  as needed. Patient not taking: Reported on 05/13/2015 05/10/15   Elson Areas, PA-C  indomethacin (INDOCIN) 25 MG capsule Take 1 capsule (25 mg total) by mouth 3 (three) times daily as needed. Patient not taking: Reported on 05/13/2015 05/10/15   Elson Areas, PA-C  oxyCODONE-acetaminophen (PERCOCET/ROXICET) 5-325 MG tablet Take 1 tablet by mouth every 4 (four) hours as needed. 09/26/15   Burgess Amor, PA-C  predniSONE (DELTASONE) 10 MG tablet 6, 5, 4, 3, 2 then 1 tablet by mouth daily for 6 days total. 09/27/15   Burgess Amor, PA-C  promethazine (PHENERGAN) 25 MG tablet Take 1 tablet (25 mg total) by mouth every 6 (six) hours as needed for nausea or vomiting. 10/13/15   Burgess Amor, PA-C    Family History History reviewed. No pertinent family history.  Social History Social History  Substance Use Topics  . Smoking status: Current Some Day Smoker    Packs/day: 0.00    Types: Cigarettes  . Smokeless tobacco: Current User    Types: Snuff  . Alcohol use No     Allergies   Sulfonamide derivatives   Review of Systems Review of Systems  Constitutional: Negative.   HENT: Negative.   Eyes: Negative.   Respiratory: Negative.   Cardiovascular: Negative.  Negative for chest pain.  Gastrointestinal: Negative.  Negative for  abdominal pain, nausea and vomiting.  Musculoskeletal: Negative for arthralgias and joint swelling.  Skin: Negative.   Neurological: Negative for dizziness, weakness, light-headedness, numbness and headaches.  Psychiatric/Behavioral: Negative for confusion, hallucinations, self-injury and suicidal ideas.     Physical Exam Updated Vital Signs BP 159/87 (BP Location: Left Arm)   Pulse 110   Temp 98.7 F (37.1 C) (Oral)   Resp 18   Ht 5\' 10"  (1.778 m)   Wt 108.9 kg   SpO2 97%   BMI 34.44 kg/m   Physical Exam  Constitutional: He appears well-developed and well-nourished.  HENT:  Head: Normocephalic and atraumatic.  Eyes: Conjunctivae are normal.  Neck: Normal  range of motion.  Cardiovascular: Normal rate, regular rhythm, normal heart sounds and intact distal pulses.   Pulmonary/Chest: Effort normal and breath sounds normal. He has no wheezes.  Abdominal: Soft. Bowel sounds are normal. There is no tenderness.  Musculoskeletal: Normal range of motion.  Neurological: He is alert.  Skin: Skin is warm and dry.  Psychiatric: His speech is normal and behavior is normal. Thought content normal. Cognition and memory are normal. He exhibits a depressed mood. He expresses no suicidal plans and no homicidal plans.  Flat affect. He is attentive.  Nursing note and vitals reviewed.    ED Treatments / Results  Labs (all labs ordered are listed, but only abnormal results are displayed) Labs Reviewed - No data to display  EKG  EKG Interpretation None       Radiology No results found.  Procedures Procedures (including critical care time)  Medications Ordered in ED Medications - No data to display   Initial Impression / Assessment and Plan / ED Course  I have reviewed the triage vital signs and the nursing notes.  Pertinent labs & imaging results that were available during my care of the patient were reviewed by me and considered in my medical decision making (see chart for details).  Clinical Course    Pt requesting assistance with opioid addiction.  He denies suicidal or homicidal ideation.  No withdrawal sx at present. He was given clonidine and phenergan scripts, referrals for obtaining treatment.  Prn f/u anticipated.    Discussed with Dr Radford PaxBeaton prior to dc home.  Final Clinical Impressions(s) / ED Diagnoses   Final diagnoses:  Narcotic abuse    New Prescriptions New Prescriptions   CLONIDINE (CATAPRES) 0.1 MG TABLET    Take 1 tablet (0.1 mg total) by mouth 3 (three) times daily.   PROMETHAZINE (PHENERGAN) 25 MG TABLET    Take 1 tablet (25 mg total) by mouth every 6 (six) hours as needed for nausea or vomiting.     Burgess AmorJulie Rosabella Edgin,  PA-C 10/13/15 2355    Nelva Nayobert Beaton, MD 10/14/15 [phone]

## 2015-11-03 ENCOUNTER — Encounter: Payer: Self-pay | Admitting: Family Medicine

## 2015-11-03 ENCOUNTER — Ambulatory Visit (INDEPENDENT_AMBULATORY_CARE_PROVIDER_SITE_OTHER): Payer: Medicaid Other | Admitting: Family Medicine

## 2015-11-03 VITALS — BP 134/76 | Ht 70.0 in | Wt 295.2 lb

## 2015-11-03 DIAGNOSIS — F1111 Opioid abuse, in remission: Secondary | ICD-10-CM

## 2015-11-03 DIAGNOSIS — F1310 Sedative, hypnotic or anxiolytic abuse, uncomplicated: Secondary | ICD-10-CM

## 2015-11-03 DIAGNOSIS — F1123 Opioid dependence with withdrawal: Secondary | ICD-10-CM | POA: Diagnosis not present

## 2015-11-03 DIAGNOSIS — F411 Generalized anxiety disorder: Secondary | ICD-10-CM | POA: Diagnosis not present

## 2015-11-03 DIAGNOSIS — G4700 Insomnia, unspecified: Secondary | ICD-10-CM

## 2015-11-03 DIAGNOSIS — F1193 Opioid use, unspecified with withdrawal: Secondary | ICD-10-CM

## 2015-11-03 MED ORDER — HYDROXYZINE HCL 25 MG PO TABS: ORAL_TABLET | ORAL | 5 refills | Status: DC

## 2015-11-03 MED ORDER — CITALOPRAM HYDROBROMIDE 20 MG PO TABS: 20.0000 mg | ORAL_TABLET | Freq: Every day | ORAL | 5 refills | Status: DC

## 2015-11-03 NOTE — Progress Notes (Signed)
   Subjective:    Patient ID: Chad Weaver, male    DOB: 1990/11/05, 25 y.o.   MRN: [id number]  Insomnia  The current episode started 1 to 4 weeks ago. Duration of naps:  Two to four hours.   Patient arrives as a new patient with a concerning set of symptomatology and concerns. Review of the chart shows numerous emergency room visits in the last few years with no vesicular primary care provider  Patient notes he just got out of rehabilitation for the third time several weeks ago. 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  After this was stopped he graduated to elicit community acquired pain medication. Unfortunately he elevated things with moving to heroin earlier this year.  He sincerely would like to stay off narcotics.  His recent voluntary rehabilitation led to recommendation for outpatient Suboxone. He declined this. They also recommended ongoing counseling he declines this.  Patient states feeling down. Stress. Anxious a lot. Not suicidal. Hopeful he can get back on track with veins do a better job for his family and himself.   [facility] at [place]  Pt was given suoxone raped taper  Legs shake at night and tossing and turnign, feeels hot and cold at times   They wanted pt to got o suxon Patient has no other concerns this visit.   Review of Systems  Psychiatric/Behavioral: The patient has insomnia.    No headache, no major weight loss or weight gain, no chest pain no back pain abdominal pain no change in bowel habits complete ROS otherwise negative     Objective:   Physical Exam  Alert vitals stable, NAD. Blood pressure good on repeat. HEENT normal. Lungs clear. Heart regular rate and rhythm. Alert oriented relatively calm not anxious appearing      Assessment & Plan:  Impression substance abuse with elements of chronic depression/anxiety/insomnia. An unwillingness to pursue long-term Suboxone/counseling  modalities. Long discussion held. Initiate Celexa daily. Add hydroxyzine daily at bedtime for sleep. We will avoid addictive substances. Regular exercise encouraged. 40 minutes spent most in discussion

## 2015-11-05 DIAGNOSIS — F411 Generalized anxiety disorder: Secondary | ICD-10-CM | POA: Insufficient documentation

## 2015-11-05 DIAGNOSIS — F1111 Opioid abuse, in remission: Secondary | ICD-10-CM | POA: Insufficient documentation

## 2015-12-04 ENCOUNTER — Ambulatory Visit: Payer: Medicaid Other | Admitting: Nurse Practitioner

## 2015-12-08 ENCOUNTER — Encounter: Payer: Self-pay | Admitting: Family Medicine

## 2015-12-08 ENCOUNTER — Ambulatory Visit (INDEPENDENT_AMBULATORY_CARE_PROVIDER_SITE_OTHER): Payer: Medicaid Other | Admitting: Family Medicine

## 2015-12-08 VITALS — BP 128/84 | Temp 98.8°F | Ht 70.0 in | Wt 294.0 lb

## 2015-12-08 DIAGNOSIS — J209 Acute bronchitis, unspecified: Secondary | ICD-10-CM | POA: Diagnosis not present

## 2015-12-08 DIAGNOSIS — J310 Chronic rhinitis: Secondary | ICD-10-CM

## 2015-12-08 DIAGNOSIS — J329 Chronic sinusitis, unspecified: Secondary | ICD-10-CM | POA: Diagnosis not present

## 2015-12-08 MED ORDER — AMOXICILLIN-POT CLAVULANATE 875-125 MG PO TABS: 1.0000 | ORAL_TABLET | Freq: Two times a day (BID) | ORAL | 0 refills | Status: AC

## 2015-12-08 NOTE — Progress Notes (Signed)
   Subjective:    Patient ID: Chad Weaver, male    DOB: [birth year], 25 y.o.   MRN: [id number]  Sinusitis  This is a new problem. Episode onset: 3 days. Associated symptoms include congestion, coughing and a sore throat.   Pos gunky cough and productive  Results for orders placed or performed during the hospital encounter of 07/05/14  Body fluid culture  Result Value Ref Range   Specimen Description FLUID RIGHT KNEE    Special Requests NONE    Gram Stain      ABUNDANT WBC PRESENT, PREDOMINANTLY PMN NO ORGANISMS SEEN Performed at [facility] Performed at [facility]    Culture      NO GROWTH 3 DAYS Performed at [facility]    Report Status 07/08/2014 FINAL   Sedimentation rate  Result Value Ref Range   Sed Rate 18 (H) 0 - 16 mm/hr  CBC with Differential/Platelet  Result Value Ref Range   WBC 15.3 (H) 4.0 - 10.5 K/uL   RBC 5.01 4.22 - 5.81 MIL/uL   Hemoglobin 15.0 13.0 - 17.0 g/dL   HCT 40.943.4 81.139.0 - 91.452.0 %   MCV 86.6 78.0 - 100.0 fL   MCH 29.9 26.0 - 34.0 pg   MCHC 34.6 30.0 - 36.0 g/dL   RDW 78.213.1 95.611.5 - 21.315.5 %   Platelets 305 150 - 400 K/uL   Neutrophils Relative % 78 (H) 43 - 77 %   Neutro Abs 12.1 (H) 1.7 - 7.7 K/uL   Lymphocytes Relative 10 (L) 12 - 46 %   Lymphs Abs 1.5 0.7 - 4.0 K/uL   Monocytes Relative 12 3 - 12 %   Monocytes Absolute 1.8 (H) 0.1 - 1.0 K/uL   Eosinophils Relative 0 0 - 5 %   Eosinophils Absolute 0.0 0.0 - 0.7 K/uL   Basophils Relative 0 0 - 1 %   Basophils Absolute 0.0 0.0 - 0.1 K/uL  Synovial cell count + diff, w/ crystals  Result Value Ref Range   Color, Synovial YELLOW YELLOW   Appearance-Synovial CLOUDY CLEAR   Crystals, Fluid RARE    WBC, Synovial 29,454 (H) 0 - 200 /cu mm   Neutrophil, Synovial 97 (H) 0 - 25 %   Lymphocytes-Synovial Fld 0 0 - 20 %   Monocyte-Macrophage-Synovial Fluid 3 (L) 50 - 90 %   Eosinophils-Synovial 0 0 - 1 %   Other Cells-SYN 0       Review of Systems  HENT: Positive for  congestion and sore throat.   Respiratory: Positive for cough.        Objective:   Physical Exam Alert, mild malaise. Hydration good Vitals stable. frontal/ maxillary tenderness evident positive nasal congestion. pharynx normal neck supple  lungs clear/no crackles or wheezes. heart regular in rhythm        Assessment & Plan:  Impression rhinosinusitis likely post viral, discussed with patient. plan antibiotics prescribed. Questions answered. Symptomatic care discussed. warning signs discussed. WSL

## 2015-12-16 ENCOUNTER — Encounter (HOSPITAL_COMMUNITY): Payer: Self-pay

## 2015-12-16 ENCOUNTER — Emergency Department (HOSPITAL_COMMUNITY)
Admission: EM | Admit: 2015-12-16 | Discharge: 2015-12-16 | Disposition: A | Discharge status: Home / Self Care | Payer: Medicaid Other | Attending: Emergency Medicine | Admitting: Emergency Medicine

## 2015-12-16 DIAGNOSIS — Z79899 Other long term (current) drug therapy: Secondary | ICD-10-CM | POA: Diagnosis not present

## 2015-12-16 DIAGNOSIS — M25561 Pain in right knee: Secondary | ICD-10-CM | POA: Diagnosis not present

## 2015-12-16 DIAGNOSIS — F17210 Nicotine dependence, cigarettes, uncomplicated: Secondary | ICD-10-CM | POA: Diagnosis not present

## 2015-12-16 DIAGNOSIS — G8929 Other chronic pain: Secondary | ICD-10-CM | POA: Diagnosis not present

## 2015-12-16 DIAGNOSIS — F17290 Nicotine dependence, other tobacco product, uncomplicated: Secondary | ICD-10-CM | POA: Insufficient documentation

## 2015-12-16 DIAGNOSIS — Z792 Long term (current) use of antibiotics: Secondary | ICD-10-CM | POA: Diagnosis not present

## 2015-12-16 NOTE — ED Provider Notes (Signed)
[facility]-EMERGENCY DEPT Provider Note   CSN: [id number] Arrival date & time: 12/16/15  2043     History   Chief Complaint Chief Complaint  Patient presents with  . Knee Pain    HPI Chad Weaver is a 25 y.o. male.  HPI   Chad Weaver is a 25 y.o. male, with a history of Gout, presenting to the ED with Right knee pain beginning yesterday. Patient states that he thinks it may be gout because he has had gout diagnosed in his right knee before. Patient has not tried any medications prior to arrival. Denies trauma, fever/chills, neuro deficits, knee swelling, or any other complaints.    Past Medical History:  Diagnosis Date  . Gout   . H/O knee surgery   . Kidney stones     Patient Active Problem List   Diagnosis Date Noted  . 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 Generalized anxiety disorder 11/05/2015  . 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 JOINT EFFUSION, KNEE 09/29/2007  . KNEE PAIN 09/29/2007  . TEAR A C L 12/09/2006    Past Surgical History:  Procedure Laterality Date  . ADENOIDECTOMY    . ANTERIOR CRUCIATE LIGAMENT REPAIR    . TYMPANOSTOMY TUBE PLACEMENT         Home Medications    Prior to Admission medications   Medication Sig Start Date End Date Taking? Authorizing Provider  amoxicillin-clavulanate (AUGMENTIN) 875-125 MG tablet Take 1 tablet by mouth 2 (two) times daily. 12/08/15 12/22/15  Merlyn AlbertWilliam S Luking, MD  citalopram (CELEXA) 20 MG tablet Take 1 tablet (20 mg total) by mouth daily. Patient not taking: Reported on 12/08/2015 11/03/15   Merlyn AlbertWilliam S Luking, MD  cloNIDine (CATAPRES) 0.1 MG tablet Take 1 tablet (0.1 mg total) by mouth 3 (three) times daily. Patient not taking: Reported on 12/08/2015 10/13/15   Burgess AmorJulie Idol, PA-C  colchicine 0.6 MG tablet Take 1 tablet (0.6 mg total) by mouth 3 (three) times daily. Patient not taking: Reported on 12/08/2015 12/01/14   Elson AreasLeslie K Sofia, PA-C  fluticasone [facility](FLONASE) 50 MCG/ACT nasal  spray 1 spray each nares twice a day Patient not taking: Reported on 12/08/2015 05/13/15   Rolland PorterMark James, MD  HYDROcodone-acetaminophen (NORCO/VICODIN) 5-325 MG tablet Take 2 tablets by mouth every 4 (four) hours as needed. Patient not taking: Reported on 12/08/2015 05/10/15   Elson AreasLeslie K Sofia, PA-C  hydrOXYzine (ATARAX/VISTARIL) 25 MG tablet Take 1 tablet by mouth at bedtime Patient not taking: Reported on 12/08/2015 11/03/15   Merlyn AlbertWilliam S Luking, MD  indomethacin (INDOCIN) 25 MG capsule Take 1 capsule (25 mg total) by mouth 3 (three) times daily as needed. Patient not taking: Reported on 12/08/2015 05/10/15   Elson AreasLeslie K Sofia, PA-C  oxyCODONE-acetaminophen (PERCOCET/ROXICET) 5-325 MG tablet Take 1 tablet by mouth every 4 (four) hours as needed. Patient not taking: Reported on 12/08/2015 09/26/15   Burgess AmorJulie Idol, PA-C  predniSONE (DELTASONE) 10 MG tablet 6, 5, 4, 3, 2 then 1 tablet by mouth daily for 6 days total. Patient not taking: Reported on 12/08/2015 09/27/15   Burgess AmorJulie Idol, PA-C  promethazine (PHENERGAN) 25 MG tablet Take 1 tablet (25 mg total) by mouth every 6 (six) hours as needed for nausea or vomiting. Patient not taking: Reported on 12/08/2015 10/13/15   Burgess AmorJulie Idol, PA-C    Family History No family history on file.  Social History Social History  Substance Use Topics  . Smoking status: Current Some Day Smoker    Packs/day:  1.00    Types: Cigarettes  . Smokeless tobacco: Current User    Types: Snuff  . Alcohol use No     Allergies   Sulfonamide derivatives   Review of Systems Review of Systems  Constitutional: Negative for fever.  Musculoskeletal: Positive for arthralgias.  Neurological: Negative for weakness and numbness.     Physical Exam Updated Vital Signs BP 146/82 (BP Location: Left Arm)   Pulse 93   Temp 98 F (36.7 C) (Oral)   Resp 20   Ht 5\' 10"  (1.778 m)   Wt 133.4 kg   SpO2 99%   BMI 42.18 kg/m   Physical Exam  Constitutional: He appears well-developed and  well-nourished. No distress.  HENT:  Head: Normocephalic and atraumatic.  Eyes: Conjunctivae are normal.  Neck: Neck supple.  Cardiovascular: Normal rate, regular rhythm and intact distal pulses.   Pulmonary/Chest: Effort normal.  Musculoskeletal: He exhibits tenderness. He exhibits no edema or deformity.  Some minor tenderness to the right anterior knee with no discernible swelling, effusion, erythema, or increased warmth. Full range of motion. Patient is weightbearing.  Neurological: He is alert.  No sensory deficits. Strength 5 out of 5 bilateral lower extremities.  Skin: Skin is warm and dry. He is not diaphoretic.  Psychiatric: He has a normal mood and affect. His behavior is normal.  Nursing note and vitals reviewed.    ED Treatments / Results  Labs (all labs ordered are listed, but only abnormal results are displayed) Labs Reviewed - No data to display  EKG  EKG Interpretation None       Radiology No results found.  Procedures Procedures (including critical care time)  Medications Ordered in ED Medications - No data to display   Initial Impression / Assessment and Plan / ED Course  I have reviewed the triage vital signs and the nursing notes.  Pertinent labs & imaging results that were available during my care of the patient were reviewed by me and considered in my medical decision making (see chart for details).  Clinical Course    Patient presents with acute on chronic right knee pain. Patient is weightbearing. No evidence of septic joint. No indication for arthrocentesis or imaging noted on exam. Patient to follow-up with his PCP. The patient was given instructions for home care as well as return precautions. Patient voices understanding of these instructions, accepts the plan, and is comfortable with discharge.  Vitals:   12/16/15 2053 12/16/15 2054  BP: 146/82   Pulse: 93   Resp: 20   Temp: 98 F (36.7 C)   TempSrc: Oral   SpO2: 99%   Weight:   133.4 kg  Height:  5\' 10"  (1.778 m)     Final Clinical Impressions(s) / ED Diagnoses   Final diagnoses:  Chronic pain of right knee    New Prescriptions Discharge Medication List as of 12/16/2015  9:29 PM       Anselm PancoastShawn C Joy, PA-C 12/17/15 1818    Anselm PancoastShawn C Joy, PA-C 12/17/15 1818    Raeford RazorStephen Kohut, MD 12/18/15 1950

## 2015-12-16 NOTE — ED Triage Notes (Signed)
Patient states that he is having right knee pain related to gout.  States that it has occurred before and gets better with Indomethacin and prednisone.  Started bothering him yesterday.

## 2015-12-16 NOTE — Discharge Instructions (Signed)
Take it easy, but do not lay around too much as this may make the stiffness worse. Keep the extremity elevated whenever possible. Take 500 mg of naproxen every 12 hours or 800 mg of ibuprofen every 8 hours for the next 3 days. Take these medications with food to avoid upset stomach.  Follow-up with your PCP as soon as possible for continued management of this issue.

## 2015-12-16 NOTE — ED Notes (Signed)
Pt appears unhappy that he has not been given the meds of his report

## 2015-12-16 NOTE — ED Notes (Signed)
Pt reports that he is followed for gout by the Lukings. He reports pain to his bilateral knees since yesterday. He has taken no meds OTC for his pain and reports that he usually receives steroids and pain meds for outbreaks. He reports that he has none of these meds at home.

## 2016-02-01 ENCOUNTER — Ambulatory Visit: Payer: Medicaid Other | Admitting: Family Medicine

## 2017-08-07 NOTE — ED Provider Notes (Signed)
[facility] EMERGENCY DEPARTMENT Provider Note   CSN: [id number] Arrival date & time:        History   Chief Complaint Chief Complaint  Patient presents with  . Drug Overdose    HPI Chad PieriniBruce W Weaver is a 27 y.o. male.  HPI Patient seen on arrival via EMS. Per report the patient was found at a gas station, on the ground, and respiratory arrest, cyanotic. EMS reports that he provided 2 mg of Narcan, and the patient soon thereafter was awake and alert, interactive. The patient himself denies drug use, reported to nursing staff that he may have been slipped something.  Past Medical History:  Diagnosis Date  . Gout   . H/O knee surgery   . Kidney stones     Patient Active Problem List   Diagnosis Date Noted  . 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 Generalized anxiety disorder 11/05/2015  . 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 JOINT EFFUSION, KNEE 09/29/2007  . KNEE PAIN 09/29/2007  . TEAR A C L 12/09/2006    Past Surgical History:  Procedure Laterality Date  . ADENOIDECTOMY    . ANTERIOR CRUCIATE LIGAMENT REPAIR    . TYMPANOSTOMY TUBE PLACEMENT          Home Medications    Prior to Admission medications   Medication Sig Start Date End Date Taking? Authorizing Provider  citalopram (CELEXA) 20 MG tablet Take 1 tablet (20 mg total) by mouth daily. Patient not taking: Reported on 12/08/2015 11/03/15   Merlyn AlbertLuking, William S, MD  cloNIDine (CATAPRES) 0.1 MG tablet Take 1 tablet (0.1 mg total) by mouth 3 (three) times daily. Patient not taking: Reported on 12/08/2015 10/13/15   Burgess AmorIdol, Julie, PA-C  colchicine 0.6 MG tablet Take 1 tablet (0.6 mg total) by mouth 3 (three) times daily. Patient not taking: Reported on 12/08/2015 12/01/14   Elson AreasSofia, Leslie K, PA-C  fluticasone [facility](FLONASE) 50 MCG/ACT nasal spray 1 spray each nares twice a day Patient not taking: Reported on 12/08/2015 05/13/15   Rolland PorterJames, Mark, MD  HYDROcodone-acetaminophen (NORCO/VICODIN)  5-325 MG tablet Take 2 tablets by mouth every 4 (four) hours as needed. Patient not taking: Reported on 12/08/2015 05/10/15   Elson AreasSofia, Leslie K, PA-C  hydrOXYzine (ATARAX/VISTARIL) 25 MG tablet Take 1 tablet by mouth at bedtime Patient not taking: Reported on 12/08/2015 11/03/15   Merlyn AlbertLuking, William S, MD  indomethacin (INDOCIN) 25 MG capsule Take 1 capsule (25 mg total) by mouth 3 (three) times daily as needed. Patient not taking: Reported on 12/08/2015 05/10/15   Elson AreasSofia, Leslie K, PA-C  oxyCODONE-acetaminophen (PERCOCET/ROXICET) 5-325 MG tablet Take 1 tablet by mouth every 4 (four) hours as needed. Patient not taking: Reported on 12/08/2015 09/26/15   Burgess AmorIdol, Julie, PA-C  predniSONE (DELTASONE) 10 MG tablet 6, 5, 4, 3, 2 then 1 tablet by mouth daily for 6 days total. Patient not taking: Reported on 12/08/2015 09/27/15   Burgess AmorIdol, Julie, PA-C  promethazine (PHENERGAN) 25 MG tablet Take 1 tablet (25 mg total) by mouth every 6 (six) hours as needed for nausea or vomiting. Patient not taking: Reported on 12/08/2015 10/13/15   Burgess AmorIdol, Julie, PA-C    Family History No family history on file.  Social History Social History   Tobacco Use  . Smoking status: Current Some Day Smoker    Packs/day: 1.00    Types: Cigarettes  . Smokeless tobacco: Current User    Types: Snuff  Substance Use Topics  . Alcohol use: No  .  Drug use: No     Allergies   Sulfonamide derivatives   Review of Systems Review of Systems  Unable to perform ROS: Other  Prior to my repeat evaluation and obtaining review of system the patient absconded   Physical Exam Updated Vital Signs There were no vitals taken for this visit.  Physical Exam  Constitutional: He appears well-developed and well-nourished.  Obese young male awake and alert arriving via EMS, on a gurney, speaking clearly  Pulmonary/Chest: Effort normal.  Neurological:  Awake, alert, speaking clearly, ambulatory, in no distress as he departed the building     ED  Treatments / Results  Labs (all labs ordered are listed, but only abnormal results are displayed) Labs Reviewed - No data to display  EKG None  Radiology No results found.  Procedures Procedures (including critical care time)  Medications Ordered in ED Medications - No data to display   Initial Impression / Assessment and Plan / ED Course  I have reviewed the triage vital signs and the nursing notes.  Pertinent labs & imaging results that were available during my care of the patient were reviewed by me and considered in my medical decision making (see chart for details).  Young male presents via EMS after, with altered mental status that improved following provision of Narcan. Patient was awake and alert, in no distress, but left prior to completion of his evaluation.  Final Clinical Impressions(s) / ED Diagnoses   Final diagnoses:  Drug overdose, undetermined intent, initial encounter     Chad Munch, MD 08/07/17 [phone]

## 2017-08-07 NOTE — ED Triage Notes (Signed)
Pt found in car at [employer], pulled out by bystanders. Per EMS was in resp arrest. Pt cyanotic. Pt was given 2mg  Narcan intranasally and within 5 mins pt was alert and oriented. Pt denies using any drugs. Pt states someone must have slipped him something. Pt refusing to stay. Will sign out AMA

## 2018-08-19 ENCOUNTER — Encounter (HOSPITAL_COMMUNITY): Payer: Self-pay

## 2018-08-19 ENCOUNTER — Emergency Department (HOSPITAL_COMMUNITY): Payer: Self-pay

## 2018-08-19 ENCOUNTER — Emergency Department (HOSPITAL_COMMUNITY)
Admission: EM | Admit: 2018-08-19 | Discharge: 2018-08-19 | Disposition: A | Discharge status: Home / Self Care | Payer: Self-pay | Attending: Emergency Medicine | Admitting: Emergency Medicine

## 2018-08-19 ENCOUNTER — Other Ambulatory Visit: Payer: Self-pay

## 2018-08-19 DIAGNOSIS — N23 Unspecified renal colic: Secondary | ICD-10-CM

## 2018-08-19 DIAGNOSIS — F17220 Nicotine dependence, chewing tobacco, uncomplicated: Secondary | ICD-10-CM | POA: Insufficient documentation

## 2018-08-19 DIAGNOSIS — Z87442 Personal history of urinary calculi: Secondary | ICD-10-CM | POA: Insufficient documentation

## 2018-08-19 DIAGNOSIS — N132 Hydronephrosis with renal and ureteral calculous obstruction: Secondary | ICD-10-CM | POA: Insufficient documentation

## 2018-08-19 DIAGNOSIS — N201 Calculus of ureter: Secondary | ICD-10-CM

## 2018-08-19 LAB — URINALYSIS, ROUTINE W REFLEX MICROSCOPIC
Bacteria, UA: NONE SEEN
Bilirubin Urine: NEGATIVE
Glucose, UA: NEGATIVE mg/dL
Ketones, ur: NEGATIVE mg/dL
Leukocytes,Ua: NEGATIVE
Nitrite: NEGATIVE
Protein, ur: 30 mg/dL — AB
RBC / HPF: >50 RBC/hpf — ABNORMAL HIGH (ref 0–5)
Specific Gravity, Urine: 1.02 (ref 1.005–1.030)
WBC, UA: >50 WBC/hpf — ABNORMAL HIGH (ref 0–5)
pH: 5 (ref 5.0–8.0)

## 2018-08-19 MED ORDER — ONDANSETRON 4 MG PO TBDP: 4.0000 mg | ORAL_TABLET | Freq: Three times a day (TID) | ORAL | 0 refills | Status: DC | PRN

## 2018-08-19 MED ORDER — KETOROLAC TROMETHAMINE 60 MG/2ML IM SOLN
60.0000 mg | Freq: Once | INTRAMUSCULAR | Status: AC
  Administered 2018-08-19: 60 mg via INTRAMUSCULAR
  Filled 2018-08-19: qty 2

## 2018-08-19 MED ORDER — OXYCODONE-ACETAMINOPHEN 5-325 MG PO TABS: ORAL_TABLET | ORAL | 0 refills | Status: DC

## 2018-08-19 MED ORDER — NAPROXEN 250 MG PO TABS: 250.0000 mg | ORAL_TABLET | Freq: Two times a day (BID) | ORAL | 0 refills | Status: DC

## 2018-08-19 MED ORDER — ONDANSETRON 8 MG PO TBDP
8.0000 mg | ORAL_TABLET | Freq: Once | ORAL | Status: AC
  Administered 2018-08-19: 8 mg via ORAL
  Filled 2018-08-19: qty 1

## 2018-08-19 MED ORDER — HYDROMORPHONE HCL 2 MG/ML IJ SOLN
2.0000 mg | Freq: Once | INTRAMUSCULAR | Status: AC
  Administered 2018-08-19: 10:00:00 2 mg via INTRAMUSCULAR
  Filled 2018-08-19: qty 1

## 2018-08-19 NOTE — ED Notes (Signed)
Pt actively vomiting in the trash can. Dr. Thurnell Garbe notified. Order given for Zofran ODT.

## 2018-08-19 NOTE — ED Notes (Addendum)
Pt tolerated water with no difficulty

## 2018-08-19 NOTE — ED Notes (Signed)
Pt reports he is unable to give urine sample at this time.

## 2018-08-19 NOTE — ED Provider Notes (Signed)
[facility] EMERGENCY DEPARTMENT Provider Note   CSN: [id number] Arrival date & time: 08/19/18  [id number]     History   Chief Complaint Chief Complaint  Patient presents with   Flank Pain    HPI Chad Weaver is a 28 y.o. male.     HPI Pt was seen at 0940. Per pt, c/o sudden onset and persistence of waxing and waning left sided flank "pain" that began last evening.  Pt describes the pain as "like my last kidney stone," and radiating into the left side of his abd.  Has been associated with "dark urine, maybe blood." Denies injury, no testicular pain/swelling, no dysuria, no abd pain, no N/V/D, no CP/SOB, no fevers, no rash.    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 Authorizing Provider  citalopram (CELEXA) 20 MG tablet Take 1 tablet (20 mg total) by mouth daily. Patient not taking: Reported on 12/08/2015 11/03/15   Merlyn AlbertLuking, William S, MD  cloNIDine (CATAPRES) 0.1 MG tablet Take 1 tablet (0.1 mg total) by mouth 3 (three) times daily. Patient not taking: Reported on 12/08/2015 10/13/15   Burgess AmorIdol, Julie, PA-C  colchicine 0.6 MG tablet Take 1 tablet (0.6 mg total) by mouth 3 (three) times daily. Patient not taking: Reported on 12/08/2015 12/01/14   Elson AreasSofia, Leslie K, PA-C  fluticasone [facility](FLONASE) 50 MCG/ACT nasal spray 1 spray each nares twice a day Patient not taking: Reported on 12/08/2015 05/13/15    Rolland PorterJames, Mark, MD  HYDROcodone-acetaminophen (NORCO/VICODIN) 5-325 MG tablet Take 2 tablets by mouth every 4 (four) hours as needed. Patient not taking: Reported on 12/08/2015 05/10/15   Elson AreasSofia, Leslie K, PA-C  hydrOXYzine (ATARAX/VISTARIL) 25 MG tablet Take 1 tablet by mouth at bedtime Patient not taking: Reported on 12/08/2015 11/03/15   Merlyn AlbertLuking, William S, MD  indomethacin (INDOCIN) 25 MG capsule Take 1 capsule (25 mg total) by mouth 3 (three) times daily as needed. Patient not taking: Reported on 08/19/2018 05/10/15   Elson AreasSofia, Leslie K, PA-C  oxyCODONE-acetaminophen (PERCOCET/ROXICET) 5-325 MG tablet Take 1 tablet by mouth every 4 (four) hours as needed. Patient not taking: Reported on 12/08/2015 09/26/15   Burgess AmorIdol, Julie, PA-C  predniSONE (DELTASONE) 10 MG tablet 6, 5, 4, 3, 2 then 1 tablet by mouth daily for 6 days total. Patient not taking: Reported on 12/08/2015 09/27/15   Burgess AmorIdol, Julie, PA-C  promethazine (PHENERGAN) 25 MG tablet Take 1 tablet (25 mg total) by mouth every 6 (six) hours as needed for nausea or vomiting. Patient not taking: Reported on 12/08/2015 10/13/15   Burgess AmorIdol, Julie, PA-C    Family History No family history on file.  Social History Social History   Tobacco Use   Smoking status: Current Some Day Smoker    Packs/day: 1.00    Types: Cigarettes   Smokeless  tobacco: Current User    Types: Snuff  Substance Use Topics   Alcohol use: No   Drug use: No     Allergies   Sulfonamide derivatives   Review of Systems Review of Systems ROS: Statement: All systems negative except as marked or noted in the HPI; Constitutional: Negative for fever and chills. ; ; Eyes: Negative for eye pain, redness and discharge. ; ; ENMT: Negative for ear pain, hoarseness, nasal congestion, sinus pressure and sore throat. ; ; Cardiovascular: Negative for chest pain, palpitations, diaphoresis, dyspnea and peripheral edema. ; ; Respiratory: Negative for cough, wheezing and stridor. ; ; Gastrointestinal:  Negative for nausea, vomiting, diarrhea, abdominal pain, blood in stool, hematemesis, jaundice and rectal bleeding. . ; ; Genitourinary: Negative for dysuria, +flank pain and possible hematuria. ; ; Genital:  No penile drainage or rash, no testicular pain or swelling, no scrotal rash or swelling. ;; Musculoskeletal: Negative for neck pain. Negative for swelling and trauma.; ; Skin: Negative for pruritus, rash, abrasions, blisters, bruising and skin lesion.; ; Neuro: Negative for headache, lightheadedness and neck stiffness. Negative for weakness, altered level of consciousness, altered mental status, extremity weakness, paresthesias, involuntary movement, seizure and syncope.       Physical Exam Updated Vital Signs BP (!) 152/96 (BP Location: Right Arm)    Pulse 87    Temp 97.8 F (36.6 C)    Resp 14    Ht 5\' 10"  (1.778 m)    Wt 127 kg    SpO2 100%    BMI 40.18 kg/m   Physical Exam 0945: Physical examination:  Nursing notes reviewed; Vital signs and O2 SAT reviewed;  Constitutional: Well developed, Well nourished, Well hydrated, Uncomfortable appearing; Head:  Normocephalic, atraumatic; Eyes: EOMI, PERRL, No scleral icterus; ENMT: Mouth and pharynx normal, Mucous membranes moist; Neck: Supple, Full range of motion, No lymphadenopathy; Cardiovascular: Regular rate and rhythm, No gallop; Respiratory: Breath sounds clear & equal bilaterally, No wheezes.  Speaking full sentences with ease, Normal respiratory effort/excursion; Chest: Nontender, Movement normal; Abdomen: Soft, Nontender, Nondistended, Normal bowel sounds; Genitourinary: No CVA tenderness; Spine:  No midline CS, TS, LS tenderness. +TTP left lumbar paraspinal muscles.;; Extremities: Peripheral pulses normal, No tenderness, No edema, No calf edema or asymmetry.; Neuro: AA&Ox3, Major CN grossly intact.  Speech clear. No gross focal motor or sensory deficits in extremities. Climbs on and off stretcher easily by herself. Gait steady.; Skin: Color  normal, Warm, Dry.     ED Treatments / Results  Labs (all labs ordered are listed, but only abnormal results are displayed)   EKG None  Radiology   Procedures Procedures (including critical care time)  Medications Ordered in ED Medications  HYDROmorphone (DILAUDID) injection 2 mg (2 mg Intramuscular Given 08/19/18 0947)     Initial Impression / Assessment and Plan / ED Course  I have reviewed the triage vital signs and the nursing notes.  Pertinent labs & imaging results that were available during my care of the patient were reviewed by me and considered in my medical decision making (see chart for details).     MDM Reviewed: previous chart, nursing note and vitals Interpretation: labs and CT scan    Results for orders placed or performed during the hospital encounter of 08/19/18  Urinalysis, Routine w reflex microscopic  Result Value Ref Range   Color, Urine YELLOW YELLOW   APPearance CLOUDY (A) CLEAR   Specific Gravity, Urine 1.020 1.005 - 1.030   pH 5.0 5.0 - 8.0  Glucose, UA NEGATIVE NEGATIVE mg/dL   Hgb urine dipstick LARGE (A) NEGATIVE   Bilirubin Urine NEGATIVE NEGATIVE   Ketones, ur NEGATIVE NEGATIVE mg/dL   Protein, ur 30 (A) NEGATIVE mg/dL   Nitrite NEGATIVE NEGATIVE   Leukocytes,Ua NEGATIVE NEGATIVE   RBC / HPF >50 (H) 0 - 5 RBC/hpf   WBC, UA >50 (H) 0 - 5 WBC/hpf   Bacteria, UA NONE SEEN NONE SEEN   Mucus PRESENT    Budding Yeast PRESENT    Granular Casts, UA PRESENT    Uric Acid Crys, UA PRESENT    Ct Renal Stone Study Result Date: 08/19/2018 CLINICAL DATA:  Left flank pain hematuria 1 day.  History of stones EXAM: CT ABDOMEN AND PELVIS WITHOUT CONTRAST TECHNIQUE: Multidetector CT imaging of the abdomen and pelvis was performed following the standard protocol without IV contrast. COMPARISON:  CT abdomen pelvis 11/24/2017 FINDINGS: Lower chest: Lung bases clear bilaterally. Hepatobiliary: No focal liver abnormality is seen. No gallstones,  gallbladder wall thickening, or biliary dilatation. Pancreas: Negative Spleen: Negative Adrenals/Urinary Tract: Mild left hydronephrosis. Tiny 1 mm calculus distal left ureter. No other renal calculi. Normal right kidney. Bladder empty. Stomach/Bowel: Stomach is within normal limits. Appendix appears normal. No evidence of bowel wall thickening, distention, or inflammatory changes. Vascular/Lymphatic: No significant vascular findings are present. No enlarged abdominal or pelvic lymph nodes. Reproductive: Normal prostate Other: None Musculoskeletal: Multiple Schmorl's nodes in the thoracic and lumbar spine. No acute skeletal abnormality. IMPRESSION: Mild left hydronephrosis and hydroureter. Partially obstructing 1 mm stone distal left ureter. No other renal calculi. Electronically Signed   By: Franchot Gallo M.D.   On: 08/19/2018 10:18    1105:  Pt has tol PO well while in the ED without N/V.  No stooling while in the ED.  Abd remains benign, resps easy, VSS. Feels better and wants to go home now. Tx symptomatically at this time, f/u Uro MD. Dx and testing, d/w pt.  Questions answered.  Verb understanding, agreeable to d/c home with outpt f/u.     Final Clinical Impressions(s) / ED Diagnoses   Final diagnoses:  None    ED Discharge Orders    None       Francine Graven, DO 08/22/18 0801

## 2018-08-19 NOTE — ED Notes (Signed)
Pt reports that he was just went to the bathroom and now his pain level has almost completely gone away. Pt reports he still feels that pain, but only slightly. Pt appears much more comfortable.

## 2018-08-19 NOTE — ED Notes (Signed)
Patient transported to CT 

## 2018-08-19 NOTE — Discharge Instructions (Signed)
Take the prescriptions as directed.  Call your regular medical doctor and your Urologist today to schedule a follow up appointment within the week.  Return to the Emergency Department immediately sooner if worsening.

## 2018-08-19 NOTE — ED Triage Notes (Signed)
Pt is having left sided flank pain that started last night. Believes he has a kidney stone. When he urinates there is not much output and it is very dark.

## 2018-08-20 LAB — URINE CULTURE: Culture: <10000 — AB

## 2018-11-07 ENCOUNTER — Emergency Department (HOSPITAL_COMMUNITY)
Admission: EM | Admit: 2018-11-07 | Discharge: 2018-11-07 | Disposition: A | Discharge status: Home / Self Care | Payer: Self-pay | Attending: Emergency Medicine | Admitting: Emergency Medicine

## 2018-11-07 ENCOUNTER — Encounter (HOSPITAL_COMMUNITY): Payer: Self-pay | Admitting: Emergency Medicine

## 2018-11-07 ENCOUNTER — Other Ambulatory Visit: Payer: Self-pay

## 2018-11-07 DIAGNOSIS — F17210 Nicotine dependence, cigarettes, uncomplicated: Secondary | ICD-10-CM | POA: Insufficient documentation

## 2018-11-07 DIAGNOSIS — M109 Gout, unspecified: Secondary | ICD-10-CM | POA: Insufficient documentation

## 2018-11-07 DIAGNOSIS — Z79899 Other long term (current) drug therapy: Secondary | ICD-10-CM | POA: Insufficient documentation

## 2018-11-07 MED ORDER — COLCHICINE 0.6 MG PO TABS
1.2000 mg | ORAL_TABLET | Freq: Once | ORAL | Status: AC
  Administered 2018-11-07: 1.2 mg via ORAL
  Filled 2018-11-07: qty 2

## 2018-11-07 MED ORDER — ONDANSETRON HCL 4 MG PO TABS
4.0000 mg | ORAL_TABLET | Freq: Once | ORAL | Status: AC
  Administered 2018-11-07: 4 mg via ORAL
  Filled 2018-11-07: qty 1

## 2018-11-07 MED ORDER — HYDROCODONE-ACETAMINOPHEN 5-325 MG PO TABS: 1.0000 | ORAL_TABLET | ORAL | 0 refills | Status: DC | PRN

## 2018-11-07 MED ORDER — PREDNISONE 20 MG PO TABS
40.0000 mg | ORAL_TABLET | Freq: Once | ORAL | Status: AC
  Administered 2018-11-07: 40 mg via ORAL
  Filled 2018-11-07: qty 2

## 2018-11-07 MED ORDER — DEXAMETHASONE 4 MG PO TABS: 4.0000 mg | ORAL_TABLET | Freq: Two times a day (BID) | ORAL | 0 refills | Status: DC

## 2018-11-07 MED ORDER — MORPHINE SULFATE (PF) 10 MG/ML IV SOLN
10.0000 mg | Freq: Once | INTRAVENOUS | Status: AC
  Administered 2018-11-07: 10 mg via INTRAMUSCULAR
  Filled 2018-11-07: qty 1

## 2018-11-07 NOTE — ED Notes (Signed)
HB in to assess 

## 2018-11-07 NOTE — ED Provider Notes (Signed)
[facility] EMERGENCY DEPARTMENT Provider Note   CSN: [id number] Arrival date & time: 11/07/18  1027     History   Chief Complaint Chief Complaint  Patient presents with  . Knee Pain    HPI Chad Weaver is a 28 y.o. male.     The history is provided by the patient.  Knee Pain Location:  Knee Time since incident:  3 hours Injury: no   Knee location:  R knee Pain details:    Quality:  Aching and throbbing   Radiates to:  Does not radiate   Severity:  Severe   Onset quality:  Sudden   Timing:  Constant   Progression:  Worsening Chronicity:  Recurrent Dislocation: no   Foreign body present:  No foreign bodies Relieved by: pain medication. Worsened by:  Bearing weight Associated symptoms: decreased ROM, stiffness and swelling   Associated symptoms: no back pain, no fever, no muscle weakness and no neck pain   Risk factors: no frequent fractures   Risk factors comment:  Gout   Past Medical History:  Diagnosis Date  . Gout   . H/O knee surgery   . Kidney stones     Patient Active Problem List   Diagnosis Date Noted  . 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 Generalized anxiety disorder 11/05/2015  . 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 JOINT EFFUSION, KNEE 09/29/2007  . KNEE PAIN 09/29/2007  . TEAR A C L 12/09/2006    Past Surgical History:  Procedure Laterality Date  . ADENOIDECTOMY    . ANTERIOR CRUCIATE LIGAMENT REPAIR    . TYMPANOSTOMY TUBE PLACEMENT          Home Medications    Prior to Admission medications   Medication Sig Start Date End Date Taking? Authorizing Provider  citalopram (CELEXA) 20 MG tablet Take 1 tablet (20 mg total) by mouth daily. Patient not taking: Reported on 12/08/2015 11/03/15   Merlyn Albert, MD  cloNIDine (CATAPRES) 0.1 MG tablet Take 1 tablet (0.1 mg total) by mouth 3 (three) times daily. Patient not taking: Reported on 12/08/2015 10/13/15   Burgess Amor, PA-C  colchicine 0.6 MG  tablet Take 1 tablet (0.6 mg total) by mouth 3 (three) times daily. Patient not taking: Reported on 12/08/2015 12/01/14   Elson Areas, PA-C  fluticasone [facility]) 50 MCG/ACT nasal spray 1 spray each nares twice a day Patient not taking: Reported on 12/08/2015 05/13/15   Rolland Porter, MD  HYDROcodone-acetaminophen (NORCO/VICODIN) 5-325 MG tablet Take 2 tablets by mouth every 4 (four) hours as needed. Patient not taking: Reported on 12/08/2015 05/10/15   Elson Areas, PA-C  hydrOXYzine (ATARAX/VISTARIL) 25 MG tablet Take 1 tablet by mouth at bedtime Patient not taking: Reported on 12/08/2015 11/03/15   Merlyn Albert, MD  indomethacin (INDOCIN) 25 MG capsule Take 1 capsule (25 mg total) by mouth 3 (three) times daily as needed. Patient not taking: Reported on 08/19/2018 05/10/15   Elson Areas, PA-C  naproxen (NAPROSYN) 250 MG tablet Take 1 tablet (250 mg total) by mouth 2 (two) times daily with a meal. 08/19/18   Samuel Jester, DO  ondansetron (ZOFRAN ODT) 4 MG disintegrating tablet Take 1 tablet (4 mg total) by mouth every 8 (eight) hours as needed for nausea or vomiting. 08/19/18   Samuel Jester, DO  oxyCODONE-acetaminophen (PERCOCET/ROXICET) 5-325 MG tablet 1 or 2 tabs PO q8h prn pain 08/19/18   Samuel Jester, DO  predniSONE (DELTASONE) 10 MG  tablet 6, 5, 4, 3, 2 then 1 tablet by mouth daily for 6 days total. Patient not taking: Reported on 12/08/2015 09/27/15   Evalee Jefferson, PA-C  promethazine (PHENERGAN) 25 MG tablet Take 1 tablet (25 mg total) by mouth every 6 (six) hours as needed for nausea or vomiting. Patient not taking: Reported on 12/08/2015 10/13/15   Evalee Jefferson, PA-C    Family History No family history on file.  Social History Social History   Tobacco Use  . Smoking status: Current Some Day Smoker    Packs/day: 1.00    Types: Cigarettes  . Smokeless tobacco: Current User    Types: Snuff  Substance Use Topics  . Alcohol use: No  . Drug use: No     Allergies    Sulfonamide derivatives   Review of Systems Review of Systems  Constitutional: Negative for activity change, appetite change and fever.  HENT: Negative for congestion, ear discharge, ear pain, facial swelling, nosebleeds, rhinorrhea, sneezing and tinnitus.   Eyes: Negative for photophobia, pain and discharge.  Respiratory: Negative for cough, choking, shortness of breath and wheezing.   Cardiovascular: Negative for chest pain, palpitations and leg swelling.  Gastrointestinal: Negative for abdominal pain, blood in stool, constipation, diarrhea, nausea and vomiting.  Genitourinary: Negative for difficulty urinating, dysuria, flank pain, frequency and hematuria.  Musculoskeletal: Positive for arthralgias and stiffness. Negative for back pain, gait problem, myalgias and neck pain.  Skin: Negative for color change, rash and wound.  Neurological: Negative for dizziness, seizures, syncope, facial asymmetry, speech difficulty, weakness and numbness.  Hematological: Negative for adenopathy. Does not bruise/bleed easily.  Psychiatric/Behavioral: Negative for agitation, confusion, hallucinations, self-injury and suicidal ideas. The patient is not nervous/anxious.      Physical Exam Updated Vital Signs BP (!) 152/85 (BP Location: Right Arm)   Pulse 92   Temp 98.2 F (36.8 C) (Oral)   Resp 18   Ht 5\' 11"  (1.803 m)   Wt 131.5 kg   SpO2 98%   BMI 40.45 kg/m   Physical Exam Vitals signs and nursing note reviewed.  Constitutional:      Appearance: He is well-developed. He is not toxic-appearing.  HENT:     Head: Normocephalic.     Right Ear: Tympanic membrane and external ear normal.     Left Ear: Tympanic membrane and external ear normal.  Eyes:     General: Lids are normal.     Pupils: Pupils are equal, round, and reactive to light.  Neck:     Musculoskeletal: Normal range of motion and neck supple.     Vascular: No carotid bruit.  Cardiovascular:     Rate and Rhythm: Normal rate  and regular rhythm.     Pulses: Normal pulses.     Heart sounds: Normal heart sounds.  Pulmonary:     Effort: No respiratory distress.     Breath sounds: Normal breath sounds.  Abdominal:     General: Bowel sounds are normal.     Palpations: Abdomen is soft.     Tenderness: There is no abdominal tenderness. There is no guarding.  Musculoskeletal:     Right hip: Normal.     Right knee: He exhibits decreased range of motion and swelling. He exhibits no deformity. Tenderness found. Lateral joint line tenderness noted.     Right ankle: Normal.  Lymphadenopathy:     Head:     Right side of head: No submandibular adenopathy.     Left side of head: No submandibular  adenopathy.     Cervical: No cervical adenopathy.  Skin:    General: Skin is warm and dry.  Neurological:     Mental Status: He is alert and oriented to person, place, and time.     Cranial Nerves: No cranial nerve deficit.     Sensory: No sensory deficit.  Psychiatric:        Speech: Speech normal.      ED Treatments / Results  Labs (all labs ordered are listed, but only abnormal results are displayed) Labs Reviewed - No data to display  EKG None  Radiology No results found.  Procedures Procedures (including critical care time)  Medications Ordered in ED Medications - No data to display   Initial Impression / Assessment and Plan / ED Course  I have reviewed the triage vital signs and the nursing notes.  Pertinent labs & imaging results that were available during my care of the patient were reviewed by me and considered in my medical decision making (see chart for details).         Final Clinical Impressions(s) / ED Diagnoses  MDM  Patient reports a history of gout.  Says he has been having increasing pain since this morning.  He says he is tried over-the-counter medications without success and has been up to the point of tears during the day today he presents now for assistance and evaluation of  this knee issue.  The vital signs are within normal limits.  The examination is consistent with gout arthritis.  The patient is treated with medication for pain.  Recheck, the patient states the pain is significantly improved.  He still has pain when he attempts to put weight on the knee.  Patient has crutches to use at home.  We will take the patient out of work for a few days and order medication for inflammation and pain for this patient.   Final diagnoses:  Exacerbation of gout    ED Discharge Orders    None       Ivery QualeBryant, Shylyn Younce, Cordelia Poche-C 11/08/18 2035    Vanetta MuldersZackowski, Scott, MD 11/09/18 [phone]

## 2018-11-07 NOTE — ED Notes (Signed)
Pt reports hx of gout  Awakened with "flare" to knee   Here for eval and pain relief

## 2018-11-07 NOTE — ED Notes (Signed)
BH in to assess

## 2018-11-07 NOTE — Discharge Instructions (Signed)
Please apply heating pad to your knee when at rest.  Please use Decadron 2 times daily with food.  Please use ibuprofen 600 mg with breakfast, lunch, dinner, and at bedtime daily.  May use Norco for more severe pain. This medication may cause drowsiness. Please do not drink, drive, or participate in activity that requires concentration while taking this medication.

## 2018-11-07 NOTE — ED Notes (Signed)
Awaiting evaluation  

## 2018-11-07 NOTE — ED Triage Notes (Signed)
RT knee pain since this morning.  Hx of gout.  Takes allopurinol and has not missed a dose.

## 2019-05-11 ENCOUNTER — Other Ambulatory Visit: Payer: Self-pay

## 2019-05-11 ENCOUNTER — Emergency Department (HOSPITAL_COMMUNITY)
Admission: EM | Admit: 2019-05-11 | Discharge: 2019-05-11 | Disposition: A | Discharge status: Home / Self Care | Payer: Self-pay | Attending: Emergency Medicine | Admitting: Emergency Medicine

## 2019-05-11 ENCOUNTER — Encounter (HOSPITAL_COMMUNITY): Payer: Self-pay | Admitting: Emergency Medicine

## 2019-05-11 DIAGNOSIS — F17210 Nicotine dependence, cigarettes, uncomplicated: Secondary | ICD-10-CM | POA: Insufficient documentation

## 2019-05-11 DIAGNOSIS — R2243 Localized swelling, mass and lump, lower limb, bilateral: Secondary | ICD-10-CM | POA: Insufficient documentation

## 2019-05-11 DIAGNOSIS — Z79899 Other long term (current) drug therapy: Secondary | ICD-10-CM | POA: Insufficient documentation

## 2019-05-11 DIAGNOSIS — M7989 Other specified soft tissue disorders: Secondary | ICD-10-CM

## 2019-05-11 NOTE — Discharge Instructions (Signed)
Please follow up with your PCP for further evaluation of your symptoms. You have declined to get blood work done today.   Return to the ED for any worsening symptoms including worsening swelling, pain, chest pain, shortness of breath.

## 2019-05-11 NOTE — ED Provider Notes (Signed)
[facility] Provider Note   CSN: [id number] Arrival date & time: 05/11/19  1431     History Chief Complaint  Patient presents with  . Foot Swelling    Chad Weaver is a 29 y.o. male with PMhx gout and kidney stones who presents to the ED today complaining of gradual onset, constant, bilateral foot swelling x 3 days. Pt also complains of bilateral hand swelling with tingling in all of his digits x 1 month. He reports he has an appointment for a yearly checkup with his PCP at the end of "this month or next month." Pt is unsure. He reports he used to be on blood pressure medication but he does not think he has high blood pressure - states that his blood pressure was high that day when his PCP started him on meds "due to pain." Pt states the medicine made him sick so he stopped taking it. He denies any tingling in his feet or pain. He denies any weakness or numbness. No headache, vision changes, unilateral weakness or numbness, speech changes, chest pain, shortness of breath, polydipsia, polyphagia, weight loss, or any other associated symptoms. Pt repots Fhx of diabetes.   The history is provided by the patient and medical records.       Past Medical History:  Diagnosis Date  . Gout   . H/O knee surgery   . Kidney stones     Patient Active Problem List   Diagnosis Date Noted  . 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 Generalized anxiety disorder 11/05/2015  . 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 JOINT EFFUSION, KNEE 09/29/2007  . KNEE PAIN 09/29/2007  . TEAR A C L 12/09/2006    Past Surgical History:  Procedure Laterality Date  . ADENOIDECTOMY    . ANTERIOR CRUCIATE LIGAMENT REPAIR    . TYMPANOSTOMY TUBE PLACEMENT         History reviewed. No pertinent family history.  Social History   Tobacco Use  . Smoking status: Current Some Day Smoker    Packs/day: 1.00    Types: Cigarettes  . Smokeless tobacco: Current User     Types: Snuff  Substance Use Topics  . Alcohol use: No  . Drug use: No    Home Medications Prior to Admission medications   Medication Sig Start Date End Date Taking? Authorizing Provider  citalopram (CELEXA) 20 MG tablet Take 1 tablet (20 mg total) by mouth daily. Patient not taking: Reported on 12/08/2015 11/03/15   Mikey Kirschner, MD  cloNIDine (CATAPRES) 0.1 MG tablet Take 1 tablet (0.1 mg total) by mouth 3 (three) times daily. Patient not taking: Reported on 12/08/2015 10/13/15   Evalee Jefferson, PA-C  colchicine 0.6 MG tablet Take 1 tablet (0.6 mg total) by mouth 3 (three) times daily. Patient not taking: Reported on 12/08/2015 12/01/14   Fransico Meadow, PA-C  dexamethasone (DECADRON) 4 MG tablet Take 1 tablet (4 mg total) by mouth 2 (two) times daily with a meal. 11/07/18   Lily Kocher, PA-C  fluticasone [facility]) 50 MCG/ACT nasal spray 1 spray each nares twice a day Patient not taking: Reported on 12/08/2015 05/13/15   Tanna Furry, MD  HYDROcodone-acetaminophen (NORCO/VICODIN) 5-325 MG tablet Take 1 tablet by mouth every 4 (four) hours as needed. 11/07/18   Lily Kocher, PA-C  hydrOXYzine (ATARAX/VISTARIL) 25 MG tablet Take 1 tablet by mouth at bedtime Patient not taking: Reported on 12/08/2015 11/03/15   Mikey Kirschner, MD  indomethacin (INDOCIN) 25 MG capsule Take 1 capsule (25 mg total) by mouth 3 (three) times daily as needed. Patient not taking: Reported on 08/19/2018 05/10/15   Elson Areas, PA-C  naproxen (NAPROSYN) 250 MG tablet Take 1 tablet (250 mg total) by mouth 2 (two) times daily with a meal. 08/19/18   Samuel Jester, DO  ondansetron (ZOFRAN ODT) 4 MG disintegrating tablet Take 1 tablet (4 mg total) by mouth every 8 (eight) hours as needed for nausea or vomiting. 08/19/18   Samuel Jester, DO  oxyCODONE-acetaminophen (PERCOCET/ROXICET) 5-325 MG tablet 1 or 2 tabs PO q8h prn pain 08/19/18   Samuel Jester, DO  predniSONE (DELTASONE) 10 MG tablet 6, 5, 4, 3, 2  then 1 tablet by mouth daily for 6 days total. Patient not taking: Reported on 12/08/2015 09/27/15   Burgess Amor, PA-C  promethazine (PHENERGAN) 25 MG tablet Take 1 tablet (25 mg total) by mouth every 6 (six) hours as needed for nausea or vomiting. Patient not taking: Reported on 12/08/2015 10/13/15   Burgess Amor, PA-C    Allergies    Sulfonamide derivatives  Review of Systems   Review of Systems  Constitutional: Negative for chills and fever.  Musculoskeletal: Positive for joint swelling.  Neurological: Negative for weakness and numbness.    Physical Exam Updated Vital Signs BP 127/73   Pulse 92   Temp 98.8 F (37.1 C) (Oral)   Resp 17   Ht 5\' 11"  (1.803 m)   Wt 131.5 kg   SpO2 98%   BMI 40.45 kg/m   Physical Exam Vitals and nursing note reviewed.  Constitutional:      Appearance: He is obese. He is not ill-appearing.  HENT:     Head: Normocephalic and atraumatic.  Eyes:     Conjunctiva/sclera: Conjunctivae normal.  Cardiovascular:     Rate and Rhythm: Normal rate and regular rhythm.     Pulses: Normal pulses.  Pulmonary:     Effort: Pulmonary effort is normal.     Breath sounds: Normal breath sounds. No wheezing, rhonchi or rales.  Abdominal:     Tenderness: There is no abdominal tenderness. There is no guarding or rebound.  Musculoskeletal:     Comments: Nonpitting edema to BLEs. No tenderness to bilateral feet. 2+ Dp pulses.   Bilateral hands to appear mildly swollen. Cap refill < 2 seconds. No tenderness. Negative phalen's and finklestein's test. 2+ radial pulse. ROM intact to all digits and wrists.   Skin:    General: Skin is warm and dry.     Coloration: Skin is not jaundiced.  Neurological:     Mental Status: He is alert.     ED Results / Procedures / Treatments   Labs (all labs ordered are listed, but only abnormal results are displayed) Labs Reviewed - No data to display  EKG None  Radiology No results found.  Procedures Procedures  (including critical care time)  Medications Ordered in ED Medications - No data to display  ED Course  I have reviewed the triage vital signs and the nursing notes.  Pertinent labs & imaging results that were available during my care of the patient were reviewed by me and considered in my medical decision making (see chart for details).  29 year old male who presents to the ED today complaining of bilateral feet swelling for the past 3 days as well as bilateral hand swelling for the past month.  On arrival to the ED patient is afebrile, nontachycardic and nontachypneic.  He  appears to be in no acute distress.  His hands do appear mildly swollen however difficult to discern feet swelling.  He has good distal pulses.  He does describe a tingling sensation to his fingertips.  Offered blood work at this time however patient declined stating he does not like needles.  It may be experiencing fluid retention given his weight.  He denies history of high blood pressure however has been on clonidine in the past.  Blood pressure stable today at 127/73.  Patient does have family history of diabetes.  I have strongly encouraged patient to follow-up with his PCP for additional blood work.  May benefit from fluid pills however will wait on PCP to prescribe this.  Patient states he is ready for discharge.   This note was prepared using Dragon voice recognition software and may include unintentional dictation errors due to the inherent limitations of voice recognition software.     MDM Rules/Calculators/A&P                      Final Clinical Impression(s) / ED Diagnoses Final diagnoses:  Bilateral swelling of feet    Rx / DC Orders ED Discharge Orders    None       Discharge Instructions     Please follow up with your PCP for further evaluation of your symptoms. You have declined to get blood work done today.   Return to the ED for any worsening symptoms including worsening swelling, pain, chest  pain, shortness of breath.        Tanda Rockers, PA-C 05/11/19 1513    Terrilee Files, MD 05/11/19 [phone]

## 2019-05-11 NOTE — ED Triage Notes (Signed)
Pt c/o of hand swelling x 1 month but past 3 days, feet have been swelling as well.

## 2019-09-02 ENCOUNTER — Other Ambulatory Visit: Payer: Self-pay

## 2019-09-02 ENCOUNTER — Encounter (HOSPITAL_COMMUNITY): Payer: Self-pay

## 2019-09-02 DIAGNOSIS — F17210 Nicotine dependence, cigarettes, uncomplicated: Secondary | ICD-10-CM | POA: Insufficient documentation

## 2019-09-02 DIAGNOSIS — K029 Dental caries, unspecified: Secondary | ICD-10-CM | POA: Insufficient documentation

## 2019-09-02 NOTE — ED Triage Notes (Signed)
Pt comes from home pov c/o dental pain on left lower side x1 week woke up today states his whole left side of face hurts.

## 2019-09-03 ENCOUNTER — Emergency Department (HOSPITAL_COMMUNITY)
Admission: EM | Admit: 2019-09-03 | Discharge: 2019-09-03 | Disposition: A | Discharge status: Home / Self Care | Payer: Self-pay | Attending: Emergency Medicine | Admitting: Emergency Medicine

## 2019-09-03 DIAGNOSIS — K029 Dental caries, unspecified: Secondary | ICD-10-CM

## 2019-09-03 MED ORDER — IBUPROFEN 400 MG PO TABS
400.0000 mg | ORAL_TABLET | Freq: Once | ORAL | Status: AC
  Administered 2019-09-03: 400 mg via ORAL
  Filled 2019-09-03: qty 1

## 2019-09-03 MED ORDER — AMOXICILLIN 500 MG PO CAPS: 1000.0000 mg | ORAL_CAPSULE | Freq: Two times a day (BID) | ORAL | 0 refills | Status: DC

## 2019-09-03 MED ORDER — AMOXICILLIN 250 MG PO CAPS
1000.0000 mg | ORAL_CAPSULE | Freq: Once | ORAL | Status: AC
  Administered 2019-09-03: 1000 mg via ORAL
  Filled 2019-09-03: qty 4

## 2019-09-03 MED ORDER — ACETAMINOPHEN 325 MG PO TABS
650.0000 mg | ORAL_TABLET | Freq: Once | ORAL | Status: AC
  Administered 2019-09-03: 650 mg via ORAL
  Filled 2019-09-03: qty 2

## 2019-09-03 NOTE — ED Provider Notes (Signed)
[facility] EMERGENCY DEPARTMENT Provider Note   CSN: [id number] Arrival date & time: 09/02/19  2149   History Chief Complaint  Patient presents with  . Dental Pain    Chad Weaver is a 29 y.o. male.  The history is provided by the patient.  Dental Pain He has history of gout, narcotic abuse currently in remission and comes in complaining of pain in his mouth for the last week.  Pain is in the left side lower and not significantly worse tonight.  Pain is currently rated at 10/10.  It is worse with chewing and with exposure to heat or cold.  He has taken ibuprofen with slight relief.  He has not seen a dentist.  Past Medical History:  Diagnosis Date  . Gout   . H/O knee surgery   . Kidney stones     Patient Active Problem List   Diagnosis Date Noted  . 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 Generalized anxiety disorder 11/05/2015  . 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 JOINT EFFUSION, KNEE 09/29/2007  . KNEE PAIN 09/29/2007  . TEAR A C L 12/09/2006    Past Surgical History:  Procedure Laterality Date  . ADENOIDECTOMY    . ANTERIOR CRUCIATE LIGAMENT REPAIR    . TYMPANOSTOMY TUBE PLACEMENT         History reviewed. No pertinent family history.  Social History   Tobacco Use  . Smoking status: Current Some Day Smoker    Packs/day: 1.00    Types: Cigarettes  . Smokeless tobacco: Current User    Types: Snuff  Substance Use Topics  . Alcohol use: No  . Drug use: No    Home Medications Prior to Admission medications   Medication Sig Start Date End Date Taking? Authorizing Provider  citalopram (CELEXA) 20 MG tablet Take 1 tablet (20 mg total) by mouth daily. Patient not taking: Reported on 12/08/2015 11/03/15   Merlyn Albert, MD  cloNIDine (CATAPRES) 0.1 MG tablet Take 1 tablet (0.1 mg total) by mouth 3 (three) times daily. Patient not taking: Reported on 12/08/2015 10/13/15   Burgess Amor, PA-C  colchicine 0.6 MG tablet Take  1 tablet (0.6 mg total) by mouth 3 (three) times daily. Patient not taking: Reported on 12/08/2015 12/01/14   Elson Areas, PA-C  dexamethasone (DECADRON) 4 MG tablet Take 1 tablet (4 mg total) by mouth 2 (two) times daily with a meal. 11/07/18   Ivery Quale, PA-C  fluticasone [facility]) 50 MCG/ACT nasal spray 1 spray each nares twice a day Patient not taking: Reported on 12/08/2015 05/13/15   Rolland Porter, MD  HYDROcodone-acetaminophen (NORCO/VICODIN) 5-325 MG tablet Take 1 tablet by mouth every 4 (four) hours as needed. 11/07/18   Ivery Quale, PA-C  hydrOXYzine (ATARAX/VISTARIL) 25 MG tablet Take 1 tablet by mouth at bedtime Patient not taking: Reported on 12/08/2015 11/03/15   Merlyn Albert, MD  indomethacin (INDOCIN) 25 MG capsule Take 1 capsule (25 mg total) by mouth 3 (three) times daily as needed. Patient not taking: Reported on 08/19/2018 05/10/15   Elson Areas, PA-C  naproxen (NAPROSYN) 250 MG tablet Take 1 tablet (250 mg total) by mouth 2 (two) times daily with a meal. 08/19/18   Samuel Jester, DO  ondansetron (ZOFRAN ODT) 4 MG disintegrating tablet Take 1 tablet (4 mg total) by mouth every 8 (eight) hours as needed for nausea or vomiting. 08/19/18   Samuel Jester, DO  oxyCODONE-acetaminophen (PERCOCET/ROXICET) 5-325 MG tablet 1  or 2 tabs PO q8h prn pain 08/19/18   Samuel Jester, DO  predniSONE (DELTASONE) 10 MG tablet 6, 5, 4, 3, 2 then 1 tablet by mouth daily for 6 days total. Patient not taking: Reported on 12/08/2015 09/27/15   Burgess Amor, PA-C  promethazine (PHENERGAN) 25 MG tablet Take 1 tablet (25 mg total) by mouth every 6 (six) hours as needed for nausea or vomiting. Patient not taking: Reported on 12/08/2015 10/13/15   Burgess Amor, PA-C    Allergies    Sulfonamide derivatives  Review of Systems   Review of Systems  All other systems reviewed and are negative.   Physical Exam Updated Vital Signs BP (!) 146/79   Pulse 86   Temp 98.3 F (36.8 C)   Resp 20    Ht 5\' 11"  (1.803 m)   Wt 127 kg   SpO2 98%   BMI 39.05 kg/m   Physical Exam Vitals and nursing note reviewed.   29 year old male, resting comfortably and in no acute distress. Vital signs are normal. Oxygen saturation is 100%, which is normal. Head is normocephalic and atraumatic. PERRLA, EOMI. Oropharynx is clear.  Tooth #18 has significant caries with tenderness over the gingiva.  There is no gingival swelling or erythema or pallor. Neck is nontender and supple without adenopathy or JVD. Back is nontender and there is no CVA tenderness. Lungs are clear without rales, wheezes, or rhonchi. Chest is nontender. Heart has regular rate and rhythm without murmur. Abdomen is soft, flat, nontender without masses or hepatosplenomegaly and peristalsis is normoactive. Extremities have no cyanosis or edema, full range of motion is present. Skin is warm and dry without rash. Neurologic: Mental status is normal, cranial nerves are intact, there are no motor or sensory deficits.  ED Results / Procedures / Treatments    Procedures Procedures   Medications Ordered in ED Medications  amoxicillin (AMOXIL) capsule 1,000 mg (1,000 mg Oral Given 09/03/19 0154)  ibuprofen (ADVIL) tablet 400 mg (400 mg Oral Given 09/03/19 0154)  acetaminophen (TYLENOL) tablet 650 mg (650 mg Oral Given 09/03/19 0154)    ED Course  I have reviewed the triage vital signs and the nursing notes.  MDM Rules/Calculators/A&P Dental pain secondary to dental caries.  Old records reviewed, and he has no relevant past visits, but does have a prior ED visit for accidental opioid overdose.  Because of opioid abuse history, will avoid narcotics.  He is given a prescription for amoxicillin and told to use over-the-counter ibuprofen and acetaminophen for pain control.  He is referred to the on-call dentist.  Final Clinical Impression(s) / ED Diagnoses Final diagnoses:  Pain due to dental caries    Rx / DC Orders ED Discharge  Orders         Ordered    amoxicillin (AMOXIL) 500 MG capsule  2 times daily     Discontinue  Reprint     09/03/19 0157           09/05/19, MD 09/03/19 0211

## 2019-09-03 NOTE — Discharge Instructions (Signed)
Take ibuprofen 400 mg every 4-6 hours as needed for pain.  Take acetaminophen 650 mg every 4-6 hours as needed for additional pain relief.

## 2021-04-17 IMAGING — CT CT RENAL STONE PROTOCOL
2 of 7 series · 14 of 46 positions shown, 19 images · non-contrast
Comparison: CT abdomen pelvis 11/24/2017

CLINICAL DATA: Left flank pain hematuria 1 day.  History of stones

EXAM:
CT ABDOMEN AND PELVIS WITHOUT CONTRAST
TECHNIQUE: Multidetector CT imaging of the abdomen and pelvis was performed
following the standard protocol without IV contrast.

[Series 2: axial st · axial · 0.98mm/px · z∈[+864,+1289]mm · 11 of 101 slices shown, 16 images]
[im 8/101  soft-tissue]
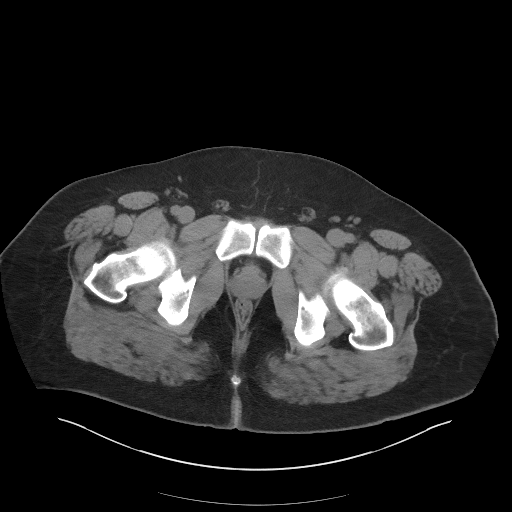
[im 8/101  bone]
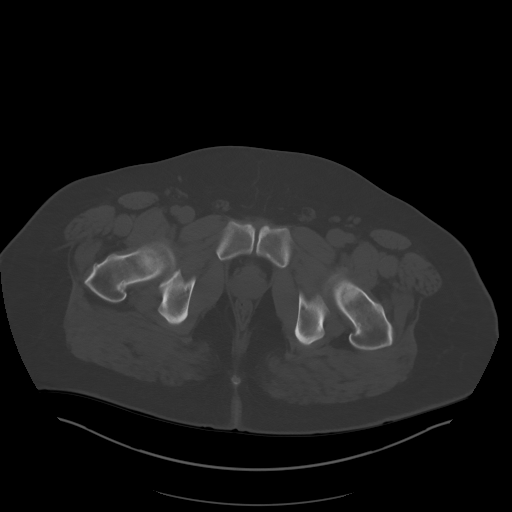
[im 15/101  soft-tissue]
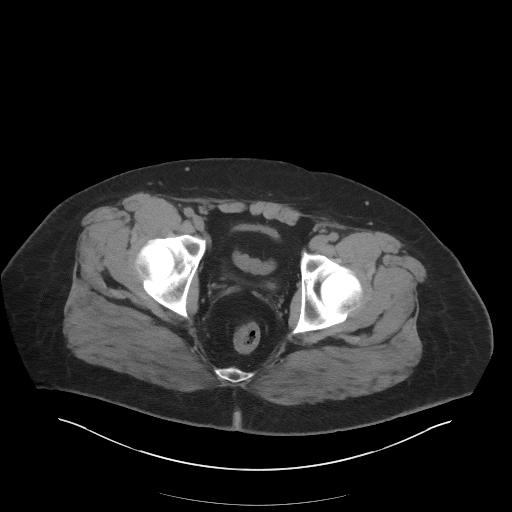
[im 29/101  soft-tissue]
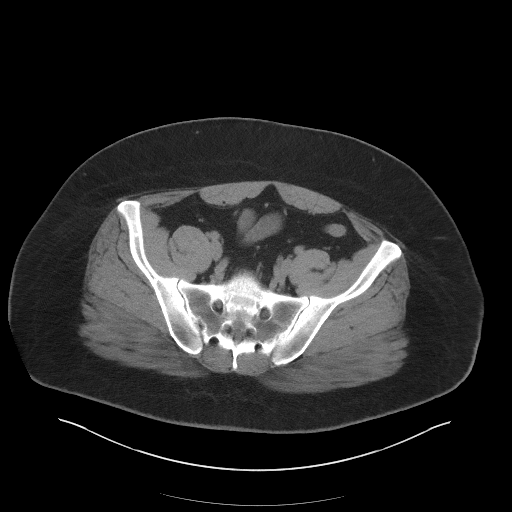
[im 36/101  soft-tissue]
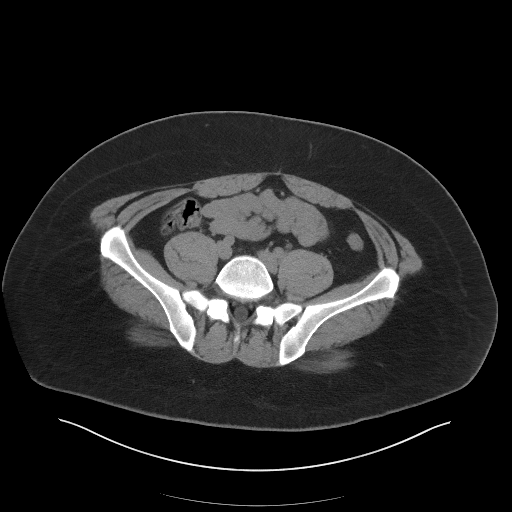
[im 43/101  soft-tissue]
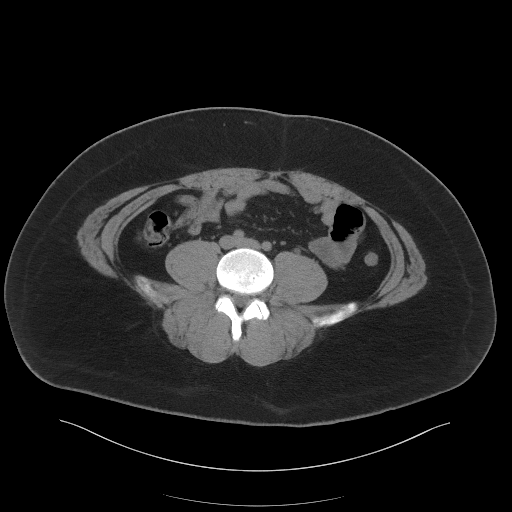
[im 58/101  soft-tissue]
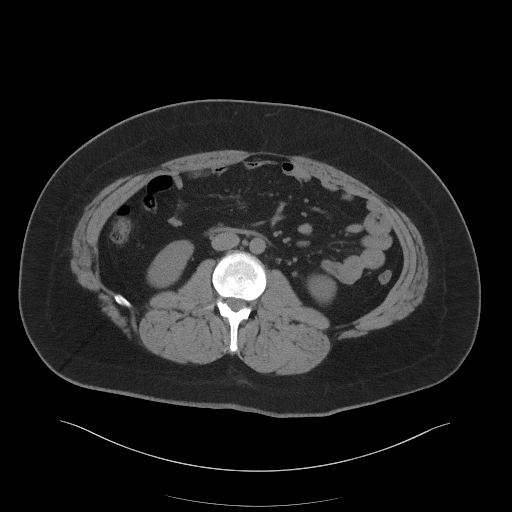
[im 65/101  soft-tissue]
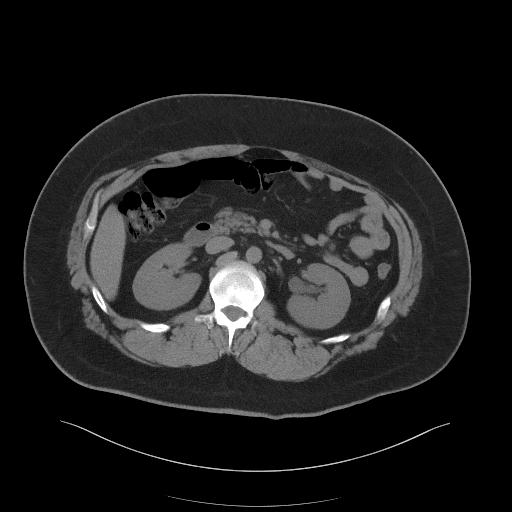
[im 72/101  soft-tissue]
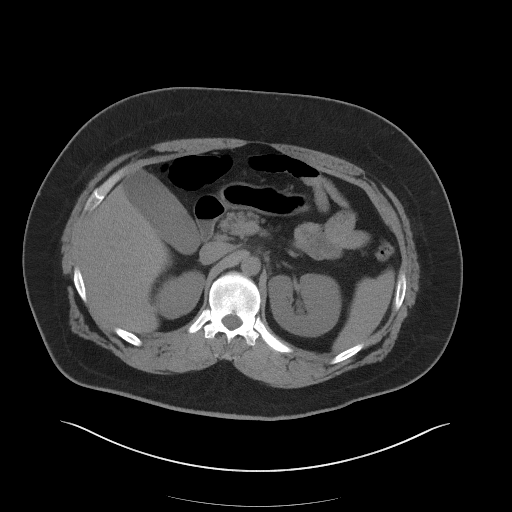
[im 72/101  lung]
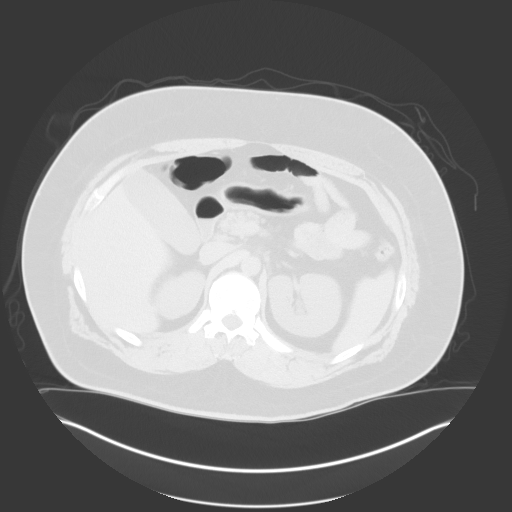
[im 79/101  lung]
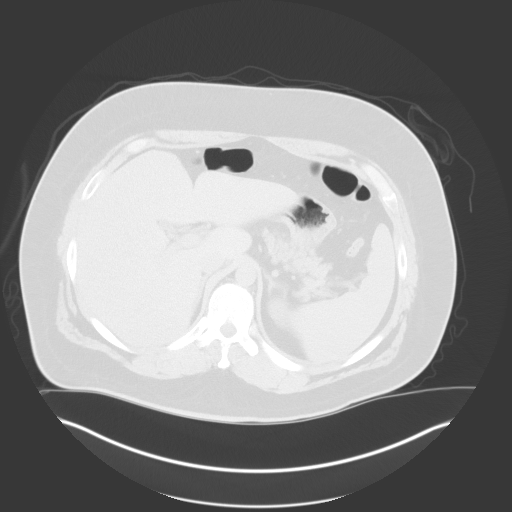
[im 86/101  soft-tissue]
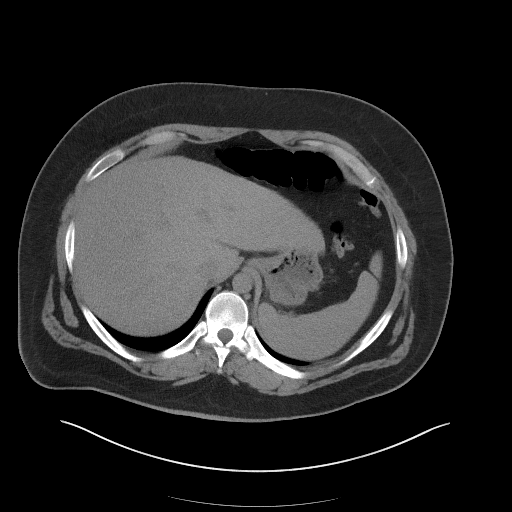
[im 86/101  lung]
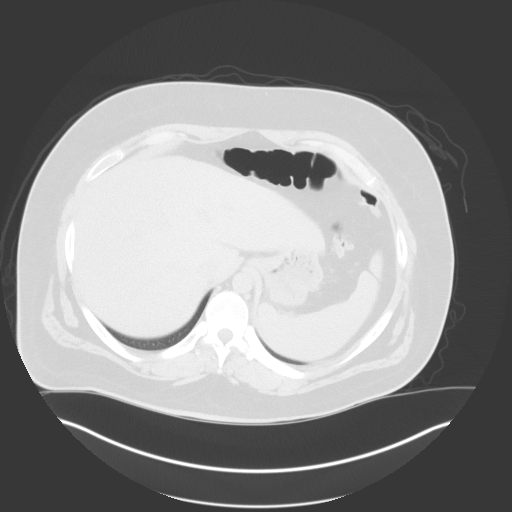
[im 86/101  bone]
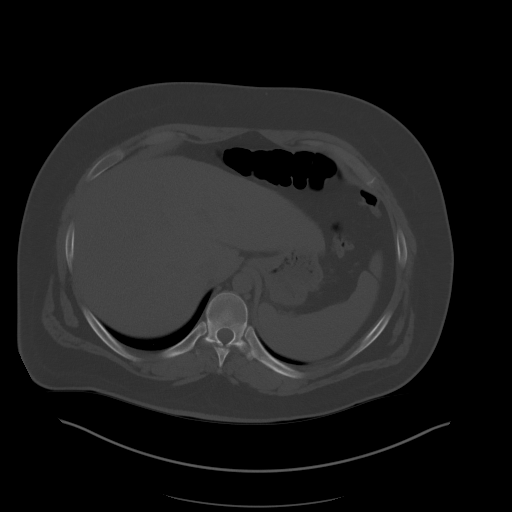
[im 93/101  soft-tissue]
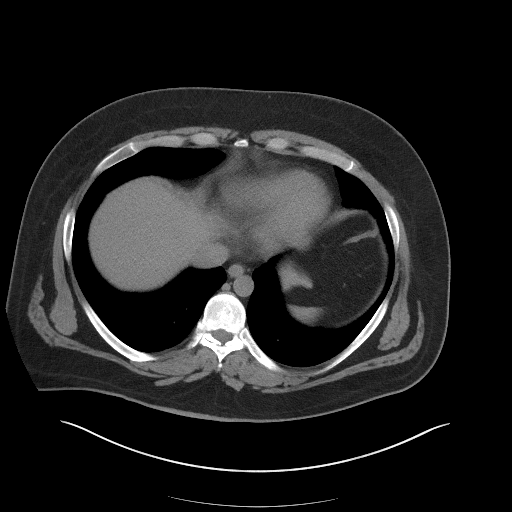
[im 93/101  lung]
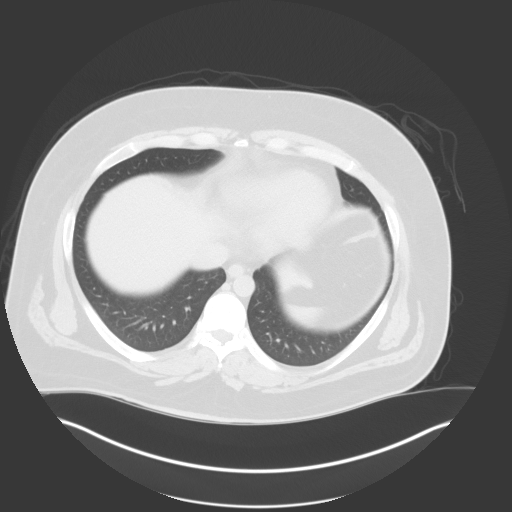

[Series 7: coronal st · coronal · 0.84mm/px · 3 of 101 slices shown]
[im 26/101  soft-tissue]
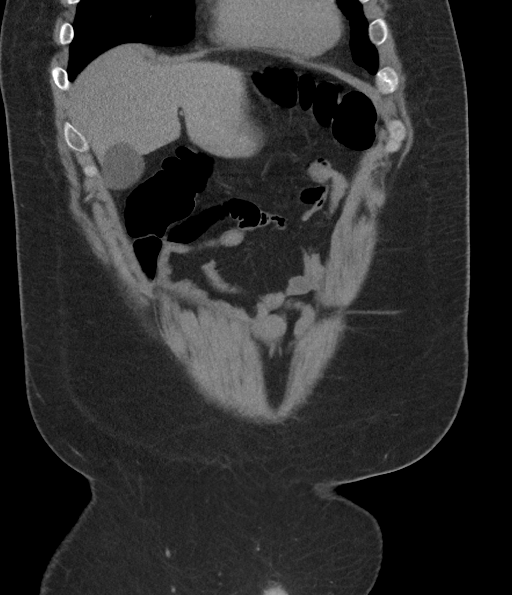
[im 51/101  soft-tissue]
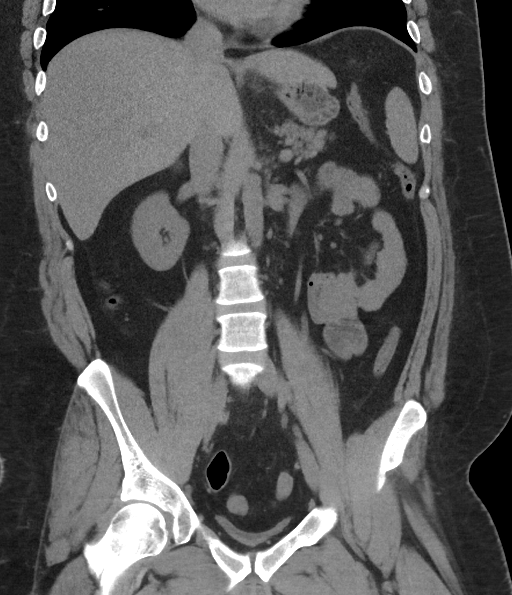
[im 76/101  soft-tissue]
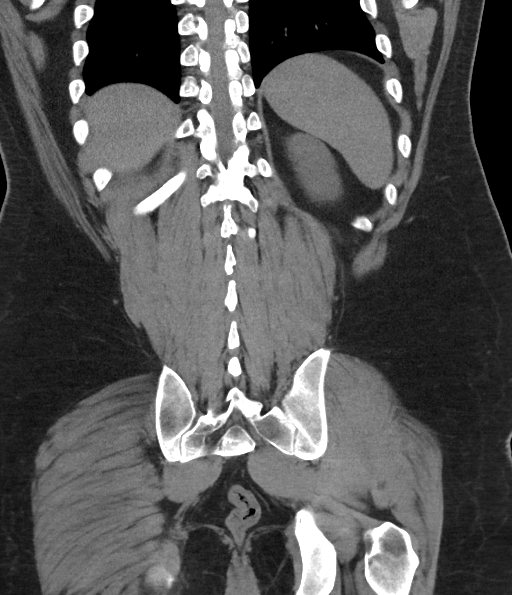

[14 of 46 positions shown; findings below may reference images not displayed]

FINDINGS: Lower chest: Lung bases clear bilaterally.

Hepatobiliary: No focal liver abnormality is seen. No gallstones,
gallbladder wall thickening, or biliary dilatation.

Pancreas: Negative

Spleen: Negative

Adrenals/Urinary Tract: Mild left hydronephrosis. Tiny 1 mm calculus
distal left ureter. No other renal calculi. Normal right kidney.
Bladder empty.

Stomach/Bowel: Stomach is within normal limits. Appendix appears
normal. No evidence of bowel wall thickening, distention, or
inflammatory changes.

Vascular/Lymphatic: No significant vascular findings are present. No
enlarged abdominal or pelvic lymph nodes.

Reproductive: Normal prostate

Other: None

Musculoskeletal: Multiple Schmorl's nodes in the thoracic and lumbar
spine. No acute skeletal abnormality.
IMPRESSION: Mild left hydronephrosis and hydroureter. Partially obstructing 1 mm
stone distal left ureter. No other renal calculi.

## 2021-05-15 ENCOUNTER — Emergency Department (HOSPITAL_COMMUNITY)
Admission: EM | Admit: 2021-05-15 | Discharge: 2021-05-15 | Disposition: A | Discharge status: Home / Self Care | Payer: Self-pay | Attending: Student | Admitting: Student

## 2021-05-15 ENCOUNTER — Emergency Department (HOSPITAL_COMMUNITY): Payer: Self-pay

## 2021-05-15 ENCOUNTER — Other Ambulatory Visit: Payer: Self-pay

## 2021-05-15 ENCOUNTER — Encounter (HOSPITAL_COMMUNITY): Payer: Self-pay | Admitting: *Deleted

## 2021-05-15 DIAGNOSIS — R82998 Other abnormal findings in urine: Secondary | ICD-10-CM | POA: Insufficient documentation

## 2021-05-15 DIAGNOSIS — R8271 Bacteriuria: Secondary | ICD-10-CM | POA: Insufficient documentation

## 2021-05-15 DIAGNOSIS — R824 Acetonuria: Secondary | ICD-10-CM | POA: Insufficient documentation

## 2021-05-15 DIAGNOSIS — K760 Fatty (change of) liver, not elsewhere classified: Secondary | ICD-10-CM | POA: Insufficient documentation

## 2021-05-15 DIAGNOSIS — N200 Calculus of kidney: Secondary | ICD-10-CM | POA: Insufficient documentation

## 2021-05-15 LAB — URINALYSIS, ROUTINE W REFLEX MICROSCOPIC
Bilirubin Urine: NEGATIVE
Glucose, UA: NEGATIVE mg/dL
Ketones, ur: 5 mg/dL — AB
Leukocytes,Ua: NEGATIVE
Nitrite: NEGATIVE
Protein, ur: 30 mg/dL — AB
RBC / HPF: >50 RBC/hpf — ABNORMAL HIGH (ref 0–5)
Specific Gravity, Urine: 1.018 (ref 1.005–1.030)
pH: 5 (ref 5.0–8.0)

## 2021-05-15 MED ORDER — CEFPODOXIME PROXETIL 200 MG PO TABS: 200.0000 mg | ORAL_TABLET | Freq: Two times a day (BID) | ORAL | 0 refills | Status: AC

## 2021-05-15 MED ORDER — ONDANSETRON 4 MG PO TBDP: 4.0000 mg | ORAL_TABLET | Freq: Three times a day (TID) | ORAL | 0 refills | Status: DC | PRN

## 2021-05-15 MED ORDER — HYDROMORPHONE HCL 1 MG/ML IJ SOLN: 1.0000 mg | Freq: Once | INTRAMUSCULAR | Status: DC

## 2021-05-15 MED ORDER — KETOROLAC TROMETHAMINE 60 MG/2ML IM SOLN: 60.0000 mg | Freq: Once | INTRAMUSCULAR | Status: DC

## 2021-05-15 MED ORDER — IBUPROFEN 600 MG PO TABS: 600.0000 mg | ORAL_TABLET | Freq: Three times a day (TID) | ORAL | 0 refills | Status: DC | PRN

## 2021-05-15 MED ORDER — ONDANSETRON HCL 4 MG/2ML IJ SOLN: 4.0000 mg | Freq: Once | INTRAMUSCULAR | Status: DC

## 2021-05-15 MED ORDER — KETOROLAC TROMETHAMINE 60 MG/2ML IM SOLN
15.0000 mg | Freq: Once | INTRAMUSCULAR | Status: AC
  Administered 2021-05-15: 15 mg via INTRAMUSCULAR
  Filled 2021-05-15: qty 2

## 2021-05-15 MED ORDER — ONDANSETRON 8 MG PO TBDP
8.0000 mg | ORAL_TABLET | Freq: Once | ORAL | Status: AC
  Administered 2021-05-15: 8 mg via ORAL
  Filled 2021-05-15: qty 1

## 2021-05-15 NOTE — ED Triage Notes (Signed)
Pt with left flank pain since 1200 today. Pt with hx of kidney stones. + emesis.  +blood in urine ?

## 2021-05-15 NOTE — ED Provider Notes (Signed)
?[facility] ?Provider Note ? ? ?CSN: [id number] ?Arrival date & time: 05/15/21  1747 ? ?  ? ?History ? ?Chief Complaint  ?Patient presents with  ? Flank Pain  ? ? ?Chad Weaver is a 31 y.o. male. ? ?HPI ?Patient is a 31 year old male with a history of kidney stones who presents to the emergency department due to left flank pain that started around 2 PM today.  Patient states that he woke from a nap with this pain.  Reports associated nausea, vomiting, as well as hematuria.  Denies any URI symptoms or dysuria.  States his pain feels consistent with prior kidney stones. ?  ? ?Home Medications ?Prior to Admission medications   ?Medication Sig Start Date End Date Taking? Authorizing Provider  ?cefpodoxime (VANTIN) 200 MG tablet Take 1 tablet (200 mg total) by mouth 2 (two) times daily for 10 days. 05/15/21 05/25/21 Yes Rayna Sexton, PA-C  ?ibuprofen (ADVIL) 600 MG tablet Take 1 tablet (600 mg total) by mouth every 8 (eight) hours as needed. 05/15/21  Yes Rayna Sexton, PA-C  ?ondansetron (ZOFRAN-ODT) 4 MG disintegrating tablet Take 1 tablet (4 mg total) by mouth every 8 (eight) hours as needed for nausea or vomiting. 05/15/21  Yes Rayna Sexton, PA-C  ?citalopram (CELEXA) 20 MG tablet Take 1 tablet (20 mg total) by mouth daily. ?Patient not taking: Reported on 12/08/2015 11/03/15 09/03/19  Mikey Kirschner, MD  ?cloNIDine (CATAPRES) 0.1 MG tablet Take 1 tablet (0.1 mg total) by mouth 3 (three) times daily. ?Patient not taking: Reported on 12/08/2015 10/13/15 09/03/19  Evalee Jefferson, PA-C  ?colchicine 0.6 MG tablet Take 1 tablet (0.6 mg total) by mouth 3 (three) times daily. ?Patient not taking: Reported on 12/08/2015 12/01/14 09/03/19  Fransico Meadow, PA-C  ?fluticasone (FLONASE) 50 MCG/ACT nasal spray 1 spray each nares twice a day ?Patient not taking: Reported on 12/08/2015 05/13/15 09/03/19  Tanna Furry, MD  ?promethazine (PHENERGAN) 25 MG tablet Take 1 tablet (25 mg total) by mouth every 6 (six)  hours as needed for nausea or vomiting. ?Patient not taking: Reported on 12/08/2015 10/13/15 09/03/19  Evalee Jefferson, PA-C  ?   ? ?Allergies    ?Sulfonamide derivatives   ? ?Review of Systems   ?Review of Systems  ?All other systems reviewed and are negative. ?Ten systems reviewed and are negative for acute change, except as noted in the HPI.   ?Physical Exam ?Updated Vital Signs ?BP 129/74 (BP Location: Left Arm)   Pulse 83   Temp (!) 97.5 ?F (36.4 ?C) (Oral)   Resp 20   Ht 5\' 11"  (1.803 m)   Wt (!) 170.1 kg   SpO2 98%   BMI 52.30 kg/m?  ?Physical Exam ?Vitals and nursing note reviewed.  ?Constitutional:   ?   General: He is not in acute distress. ?   Appearance: Normal appearance. He is not ill-appearing, toxic-appearing or diaphoretic.  ?HENT:  ?   Head: Normocephalic and atraumatic.  ?   Right Ear: External ear normal.  ?   Left Ear: External ear normal.  ?   Nose: Nose normal.  ?   Mouth/Throat:  ?   Mouth: Mucous membranes are moist.  ?   Pharynx: Oropharynx is clear. No oropharyngeal exudate or posterior oropharyngeal erythema.  ?Eyes:  ?   Extraocular Movements: Extraocular movements intact.  ?Cardiovascular:  ?   Rate and Rhythm: Normal rate and regular rhythm.  ?   Pulses: Normal pulses.  ?   Heart  sounds: Normal heart sounds. No murmur heard. ?  No friction rub. No gallop.  ?Pulmonary:  ?   Effort: Pulmonary effort is normal. No respiratory distress.  ?   Breath sounds: Normal breath sounds. No stridor. No wheezing, rhonchi or rales.  ?Abdominal:  ?   General: Abdomen is flat.  ?   Palpations: Abdomen is soft.  ?   Tenderness: There is abdominal tenderness.  ?   Comments: Difficult to assess due to body habitus.  Protuberant abdomen that is soft.  Left CVA tenderness with mild to moderate tenderness along the left lateral abdomen and left lower quadrant.  ?Musculoskeletal:     ?   General: Normal range of motion.  ?   Cervical back: Normal range of motion and neck supple. No tenderness.  ?Skin: ?    General: Skin is warm and dry.  ?Neurological:  ?   General: No focal deficit present.  ?   Mental Status: He is alert and oriented to person, place, and time.  ?Psychiatric:     ?   Mood and Affect: Mood normal.     ?   Behavior: Behavior normal.  ? ?ED Results / Procedures / Treatments   ?Labs ?(all labs ordered are listed, but only abnormal results are displayed) ?Labs Reviewed  ?URINALYSIS, ROUTINE W REFLEX MICROSCOPIC - Abnormal; Notable for the following components:  ?    Result Value  ? APPearance HAZY (*)   ? Hgb urine dipstick LARGE (*)   ? Ketones, ur 5 (*)   ? Protein, ur 30 (*)   ? RBC / HPF >50 (*)   ? Bacteria, UA FEW (*)   ? All other components within normal limits  ?URINE CULTURE  ? ?EKG ?None ? ?Radiology ?CT Renal Stone Study ? ?Result Date: 05/15/2021 ?CLINICAL DATA:  Left-sided flank pain for 1 day EXAM: CT ABDOMEN AND PELVIS WITHOUT CONTRAST TECHNIQUE: Multidetector CT imaging of the abdomen and pelvis was performed following the standard protocol without IV contrast. RADIATION DOSE REDUCTION: This exam was performed according to the departmental dose-optimization program which includes automated exposure control, adjustment of the mA and/or kV according to patient size and/or use of iterative reconstruction technique. COMPARISON:  08/19/2018 FINDINGS: Lower chest: No acute pleural or parenchymal lung disease. Hepatobiliary: Diffuse hepatic steatosis with focal fatty sparing at the gallbladder fossa. Otherwise unremarkable unenhanced appearance of the liver and gallbladder. Pancreas: Unremarkable unenhanced appearance. Spleen: Unremarkable unenhanced appearance. Adrenals/Urinary Tract: There is mild to moderate left-sided hydronephrosis due to a 2 mm left UPJ calculus, reference image 48/2. There is a second nonobstructing calculus within the left renal pelvis, measuring 5 mm. The right kidney is unremarkable. The adrenals and bladder are grossly normal. Stomach/Bowel: No bowel obstruction or  ileus. The appendix is not identified, and may be surgically absent. No bowel wall thickening or inflammatory change. Vascular/Lymphatic: No significant vascular findings are present. No enlarged abdominal or pelvic lymph nodes. Reproductive: Prostate is unremarkable. Other: No free fluid or free intraperitoneal gas. No abdominal wall hernia. Musculoskeletal: No acute or destructive bony lesions. Reconstructed images demonstrate no additional findings. IMPRESSION: 1. Mild left-sided obstructive uropathy due to a 2 mm obstructing left UPJ calculus. 2. Additional nonobstructing 5 mm left renal calculus. 3. Hepatic steatosis, with focal fatty sparing at the gallbladder fossa. Electronically Signed   By: Randa Ngo M.D.   On: 05/15/2021 19:02   ? ?Procedures ?Procedures  ? ?Medications Ordered in ED ?Medications  ?ondansetron (ZOFRAN-ODT) disintegrating tablet 8  mg (8 mg Oral Given 05/15/21 1933)  ?ketorolac (TORADOL) injection 15 mg (15 mg Intramuscular Given 05/15/21 1933)  ? ?ED Course/ Medical Decision Making/ A&P ?Clinical Course as of 05/15/21 2201  ?Tue May 15, 2021  ?[facility] staff notified me that they are having a difficult time obtaining IV access on the patient.  He then refused any IV medications as well as any blood work. [LJ]  ?1926 CT Renal Stone Study ?2 mm obstructing left UPJ calculus.  Patient refusing IV or any IV medications.  We will treat with IM Toradol as well as ODT Zofran. [LJ]  ?  ?Clinical Course User Index ?[LJ] Rayna Sexton, PA-C  ? ?                        ?Medical Decision Making ?Amount and/or Complexity of Data Reviewed ?Labs: ordered. ?Radiology: ordered. Decision-making details documented in ED Course. ? ?Risk ?Prescription drug management. ? ?Pt is a 31 y.o. male with a history of kidney stones who presents to the emergency department due to flank and abdominal pain as well as hematuria. ? ?Labs: ?UA with large hemoglobin, 5 ketones, 30 protein, greater than 50 RBCs,  21-50 white blood cells, few bacteria. ?Urine culture sent. ? ?Imaging: ?CT renal stone study shows a mild left-sided obstructive uropathy due to a 2 mm obstructing left UPJ calculus.  Additional nonobstructing 5 mm

## 2021-05-15 NOTE — ED Notes (Signed)
Attempted IV x2, pt doesn't want anymore attempts or blood work drawn, [employer] informed. ?

## 2021-05-15 NOTE — Discharge Instructions (Addendum)
I am prescribing you a medication called Zofran.  This is a disintegrating tablet you can use up to 3 times a day for management of your nausea and vomiting.  Please only take this as prescribed.  Please only take this if you are experiencing nausea and vomiting that you cannot control. ? ?I am also prescribing 600 mg ibuprofen.  You can take this up to 3 times per day for management of your pain.  Please only take this as prescribed.  Try to take this with a small amount of food to help prevent stomach irritation. ? ?Lastly, I am prescribing you an antibiotic called cefpodoxime.  I want you to take this twice per day for the next 10 days.  Please only take this as prescribed.  Do not stop taking it early even if you feel your symptoms have resolved. ? ?If you develop any new or worsening symptoms please come back to the emergency department.  Below is the contact information for [facility].  Please give them a call and schedule an appointment for reevaluation of your recurrent kidney stones. ?

## 2021-05-15 NOTE — ED Notes (Signed)
Went to assess for an IV and pt refused to have an IV. Primary nurse informed.  ?

## 2021-05-17 LAB — URINE CULTURE: Culture: NO GROWTH

## 2021-11-02 ENCOUNTER — Encounter: Payer: Self-pay | Admitting: Urology

## 2021-11-02 ENCOUNTER — Ambulatory Visit (INDEPENDENT_AMBULATORY_CARE_PROVIDER_SITE_OTHER): Payer: Self-pay | Admitting: Urology

## 2021-11-02 VITALS — BP 148/94 | HR 91 | Ht 71.0 in | Wt 375.0 lb

## 2021-11-02 DIAGNOSIS — N200 Calculus of kidney: Secondary | ICD-10-CM | POA: Insufficient documentation

## 2021-11-02 DIAGNOSIS — R109 Unspecified abdominal pain: Secondary | ICD-10-CM

## 2021-11-02 DIAGNOSIS — R310 Gross hematuria: Secondary | ICD-10-CM

## 2021-11-02 LAB — URINALYSIS, ROUTINE W REFLEX MICROSCOPIC
Bilirubin, UA: NEGATIVE
Ketones, UA: NEGATIVE
Leukocytes,UA: NEGATIVE
Nitrite, UA: NEGATIVE
Specific Gravity, UA: 1.02 (ref 1.005–1.030)
Urobilinogen, Ur: 0.2 mg/dL (ref 0.2–1.0)
pH, UA: 5 (ref 5.0–7.5)

## 2021-11-02 LAB — MICROSCOPIC EXAMINATION
Epithelial Cells (non renal): NONE SEEN /hpf (ref 0–10)
RBC, Urine: >30 /hpf — AB (ref 0–2)
Renal Epithel, UA: NONE SEEN /hpf
WBC, UA: NONE SEEN /hpf (ref 0–5)

## 2021-11-02 NOTE — Progress Notes (Signed)
Assessment: 1. Nephrolithiasis   2. Flank pain     Plan: I reviewed the patient's chart including notes from his ER visit in March 2023 as well as lab results and imaging results. I personally reviewed the CT study from March 2023 showing a proximal left ureteral calculus and a left renal calculus.  Patient will bring the recently passed stone to the office for stone analysis. I am suspicious that he may have uric acid stones given his history of gout and low urine pH. Recommend further evaluation with CT imaging to determine his current stone burden. Will likely need 24-hour urine for metabolic evaluation pending results of above. Will call with result of CT and stone analysis Patient declined pain medication today  Chief Complaint:  Chief Complaint  Patient presents with   Flank Pain   Nephrolithiasis    History of Present Illness:  Chad Weaver is a 31 y.o. male who is seen for evaluation of left flank pain and history of nephrolithiasis.   He has a prior history of kidney stones for about 10 years.  He reports intermittent left-sided flank pain for approximately 6 months. CT imaging from March 2023 showed a 2 mm calculus at the left UPJ with mild to moderate hydronephrosis and a 5 mm left renal calculus.  He reports passing stones following the CT scan.  Most recently, he has been passing gravel.  He does have associated gross hematuria with these episodes.  He also reports occasional nausea without vomiting.  No fevers or chills.  He last passed a stone several days ago.  No prior stone analysis available.  He has not required surgical intervention for his stones to date.  He has a history of gout.  He is not currently on any medical therapy for gout. Review of urinalysis from March 2023 showed a pH of 5.0, >50 RBCs, 21-50 WBCs.  Urine culture showed no growth.  Past Medical History:  Past Medical History:  Diagnosis Date   Gout    H/O knee surgery    Kidney stones      Past Surgical History:  Past Surgical History:  Procedure Laterality Date   ADENOIDECTOMY     ANTERIOR CRUCIATE LIGAMENT REPAIR     TYMPANOSTOMY TUBE PLACEMENT      Allergies:  Allergies  Allergen Reactions   Sulfonamide Derivatives Swelling and Rash    Family History:  History reviewed. No pertinent family history.  Social History:  Social History   Tobacco Use   Smoking status: Some Days    Packs/day: 1.00    Types: Cigarettes   Smokeless tobacco: Current    Types: Snuff  Substance Use Topics   Alcohol use: No   Drug use: No    Review of symptoms:  Constitutional:  Negative for unexplained weight loss, night sweats, fever, chills ENT:  Negative for nose bleeds, sinus pain, painful swallowing CV:  Negative for chest pain, shortness of breath, exercise intolerance, palpitations, loss of consciousness Resp:  Negative for cough, wheezing, shortness of breath GI:  Negative for vomiting, diarrhea, bloody stools GU:  Positives noted in HPI; otherwise negative for dysuria, urinary incontinence Neuro:  Negative for seizures, poor balance, limb weakness, slurred speech Psych:  Negative for lack of energy, depression, anxiety Endocrine:  Negative for polydipsia, polyuria, symptoms of hypoglycemia (dizziness, hunger, sweating) Hematologic:  Negative for anemia, purpura, petechia, prolonged or excessive bleeding, use of anticoagulants  Allergic:  Negative for difficulty breathing or choking as a result of  exposure to anything; no shellfish allergy; no allergic response (rash/itch) to materials, foods  Physical exam: BP (!) 148/94   Pulse 91   Ht 5\' 11"  (1.803 m)   Wt (!) 375 lb (170.1 kg)   BMI 52.30 kg/m  GENERAL APPEARANCE:  Well appearing, well developed, well nourished, NAD HEENT: Atraumatic, Normocephalic, oropharynx clear. NECK: Supple without lymphadenopathy or thyromegaly. LUNGS: Clear to auscultation bilaterally. HEART: Regular Rate and Rhythm without  murmurs, gallops, or rubs. ABDOMEN: Soft, non-tender, No Masses. EXTREMITIES: Moves all extremities well.  Without clubbing, cyanosis, or edema. NEUROLOGIC:  Alert and oriented x 3, normal gait, CN II-XII grossly intact.  MENTAL STATUS:  Appropriate. BACK:  Non-tender to palpation.  No CVAT SKIN:  Warm, dry and intact.    Results: U/A: pH 5.0, >30 RBCs, few bacteria

## 2021-11-02 NOTE — Addendum Note (Signed)
Addended byGustavus Messing on: 11/02/2021 01:20 PM   Modules accepted: Orders

## 2021-11-09 ENCOUNTER — Encounter (HOSPITAL_COMMUNITY): Payer: Self-pay

## 2021-11-09 ENCOUNTER — Ambulatory Visit (HOSPITAL_COMMUNITY)
Admission: RE | Admit: 2021-11-09 | Discharge: 2021-11-09 | Disposition: A | Discharge status: Home / Self Care | Payer: Self-pay | Source: Ambulatory Visit | Attending: Urology | Admitting: Urology

## 2021-11-09 DIAGNOSIS — N200 Calculus of kidney: Secondary | ICD-10-CM | POA: Insufficient documentation

## 2021-11-09 DIAGNOSIS — R109 Unspecified abdominal pain: Secondary | ICD-10-CM | POA: Insufficient documentation

## 2021-11-09 LAB — CALCULI, WITH PHOTOGRAPH (CLINICAL LAB)
Calcium Oxalate Dihydrate: 10 %
Uric Acid Calculi: 90 %
Weight Calculi: 55 mg

## 2021-11-15 ENCOUNTER — Telehealth: Payer: Self-pay

## 2021-11-15 ENCOUNTER — Other Ambulatory Visit: Payer: Self-pay | Admitting: Urology

## 2021-11-15 DIAGNOSIS — R109 Unspecified abdominal pain: Secondary | ICD-10-CM

## 2021-11-15 DIAGNOSIS — N200 Calculus of kidney: Secondary | ICD-10-CM

## 2021-11-15 MED ORDER — OXYCODONE HCL 5 MG PO TABS: 5.0000 mg | ORAL_TABLET | Freq: Four times a day (QID) | ORAL | 0 refills | Status: DC | PRN

## 2021-11-15 NOTE — Telephone Encounter (Signed)
-----   Message from Primus Bravo, MD sent at 11/13/2021  4:14 PM EDT ----- Please let patient know he can come by office to pick up order form for 24 hour urine.

## 2021-11-15 NOTE — Telephone Encounter (Signed)
Rx sent to pharmacy at 1430 by MD

## 2021-11-15 NOTE — Telephone Encounter (Signed)
Patient aware and will pick up at front desk.

## 2021-11-15 NOTE — Telephone Encounter (Signed)
Patient is calling to have a pain Rx sent to [place], [place] Phone:  [phone]  Fax:  [phone]    ASAP. In a lot of pain.  Thanks, Helene Kelp

## 2021-11-22 ENCOUNTER — Other Ambulatory Visit: Payer: Self-pay | Admitting: Urology

## 2021-11-22 DIAGNOSIS — N200 Calculus of kidney: Secondary | ICD-10-CM

## 2021-11-27 LAB — LITHOLINK 24HR URINE PANEL
Ammonium, Urine: 61 mmol/24 hr — ABNORMAL HIGH (ref 15–60)
Calcium Oxalate Saturation: 2.72 — ABNORMAL LOW (ref 6.00–10.00)
Calcium Phosphate Saturation: 0.15 — ABNORMAL LOW (ref 0.50–2.00)
Calcium, Urine: 100 mg/24 hr (ref <250)
Calcium/Creatinine Ratio: 42 mg/g creat (ref 34–196)
Calcium/Kg Body Weight: 0.6 mg/24 hr/kg (ref <4.0)
Chloride, Urine: 217 mmol/24 hr (ref 70–250)
Citrate, Urine: 1320 mg/24 hr (ref >450)
Creatinine, Urine: 2377 mg/24 hr
Creatinine/Kg Body Weight: 14 mg/24 hr/kg (ref 11.9–24.4)
Cystine, Urine, Qualitative: NEGATIVE
Magnesium, Urine: 155 mg/24 hr — ABNORMAL HIGH (ref 30–120)
Oxalate, Urine: 54 mg/24 hr — ABNORMAL HIGH (ref 20–40)
Phosphorus, Urine: 1927 mg/24 hr — ABNORMAL HIGH (ref 600–1200)
Potassium, Urine: 59 mmol/24 hr (ref 20–100)
Protein Catabolic Rate: 0.7 g/kg/24 hr — ABNORMAL LOW (ref 0.8–1.4)
Sodium, Urine: 256 mmol/24 hr — ABNORMAL HIGH (ref 50–150)
Sulfate, Urine: 58 meq/24 hr (ref 20–80)
Urea Nitrogen, Urine: 14.22 g/24 hr — ABNORMAL HIGH (ref 6.00–14.00)
Uric Acid Saturation: 1.94 — ABNORMAL HIGH (ref <1.00)
Uric Acid, Urine: 938 mg/24 hr — ABNORMAL HIGH (ref <800)
Urine Volume (Preserved): 2520 mL/24 hr (ref 500–4000)
pH, 24 hr, Urine: 5.4 — ABNORMAL LOW (ref 5.800–6.200)

## 2021-12-03 ENCOUNTER — Telehealth: Payer: Self-pay

## 2021-12-03 MED ORDER — POTASSIUM CITRATE ER 15 MEQ (1620 MG) PO TBCR: 1.0000 | EXTENDED_RELEASE_TABLET | Freq: Two times a day (BID) | ORAL | 3 refills | Status: DC

## 2021-12-03 NOTE — Addendum Note (Signed)
Addended by: Primus Bravo on: 12/03/2021 01:18 PM   Modules accepted: Orders

## 2021-12-03 NOTE — Telephone Encounter (Signed)
Patient informed of MD response to 24hr Urine panel, pt scheduled for a 2 week f/u on 11/02 with MD.

## 2021-12-03 NOTE — Telephone Encounter (Signed)
-----   Message from Primus Bravo, MD sent at 12/03/2021  1:18 PM EDT ----- Please notify patient that his stone analysis showed a primarily uric acid stone. His 24 hour urine shows a low pH and elevated uric acid level.   Recommend management with urinary alkalinization with potassium citrate.  Rx sent to pharmacy to take BID. Recommend f/u appt in 2 weeks.

## 2021-12-04 ENCOUNTER — Other Ambulatory Visit: Payer: Self-pay

## 2021-12-04 DIAGNOSIS — N200 Calculus of kidney: Secondary | ICD-10-CM

## 2021-12-04 MED ORDER — POTASSIUM CITRATE ER 15 MEQ (1620 MG) PO TBCR: 1.0000 | EXTENDED_RELEASE_TABLET | Freq: Two times a day (BID) | ORAL | 3 refills | Status: DC

## 2021-12-20 ENCOUNTER — Ambulatory Visit (INDEPENDENT_AMBULATORY_CARE_PROVIDER_SITE_OTHER): Payer: Self-pay | Admitting: Urology

## 2021-12-20 ENCOUNTER — Encounter: Payer: Self-pay | Admitting: Urology

## 2021-12-20 VITALS — BP 167/100 | HR 116 | Ht 71.0 in | Wt 375.0 lb

## 2021-12-20 DIAGNOSIS — R109 Unspecified abdominal pain: Secondary | ICD-10-CM

## 2021-12-20 DIAGNOSIS — N200 Calculus of kidney: Secondary | ICD-10-CM

## 2021-12-20 LAB — MICROSCOPIC EXAMINATION: WBC, UA: NONE SEEN /hpf (ref 0–5)

## 2021-12-20 LAB — URINALYSIS, ROUTINE W REFLEX MICROSCOPIC
Bilirubin, UA: NEGATIVE
Leukocytes,UA: NEGATIVE
Nitrite, UA: NEGATIVE
Protein,UA: NEGATIVE
Specific Gravity, UA: 1.015 (ref 1.005–1.030)
Urobilinogen, Ur: 0.2 mg/dL (ref 0.2–1.0)
pH, UA: 5 (ref 5.0–7.5)

## 2021-12-20 MED ORDER — OXYCODONE HCL 5 MG PO TABS: 5.0000 mg | ORAL_TABLET | Freq: Four times a day (QID) | ORAL | 0 refills | Status: DC | PRN

## 2021-12-20 NOTE — Patient Instructions (Signed)
Baking soda 1 tablespoon (mix with juice) three times a day

## 2021-12-20 NOTE — Progress Notes (Signed)
Assessment: 1. Nephrolithiasis   2. Flank pain, left     Plan: I reviewed the CT study from 11/09/2021 with results as noted below.  Results discussed with him. I also reviewed reviewed the recent stone analysis and 24-hour urine results with the patient today. I think it is very likely that the left kidney stone is primarily uric acid given his recent stone analysis, urine pH, and history of gout.  This is likely formed due to his discontinuation of his gout medication. He has been taking potassium citrate twice daily but his urine pH remains low. Recommend beginning baking soda 1 tablespoon 3 times daily for urinary alkalinization. BMP, uric acid today. Will consider adding allopurinol pending results of uric acid level. Prescription for pain medication provided. I discussed management options for the left renal calculus with the patient today including continued urinary alkalinization in hopes of stone dissolution, ureteroscopy with laser lithotripsy and stent insertion, and percutaneous nephrolithotomy. He like to try nonsurgical management at this time.  Patient advised to seek medical attention for fever, chills, uncontrolled pain, or intractable vomiting. Return to office in 2 weeks.  Chief Complaint:  Chief Complaint  Patient presents with   Nephrolithiasis    History of Present Illness:  Chad Weaver is a 31 y.o. male who is seen for further evaluation of left flank pain and nephrolithiasis.   He has a prior history of kidney stones for about 10 years.  He reports intermittent left-sided flank pain for approximately 6 months. CT imaging from March 2023 showed a 2 mm calculus at the left UPJ with mild to moderate hydronephrosis and a 5 mm left renal calculus.  He reports passing stones following the CT scan.  Most recently, he has been passing gravel.  He does have associated gross hematuria with these episodes.  He also reports occasional nausea without vomiting.  No fevers  or chills.  No prior stone analysis available.  He has not required surgical intervention for his stones to date.  He has a history of gout.  He is not currently on any medical therapy for gout. Review of urinalysis from March 2023 showed a pH of 5.0, >50 RBCs, 21-50 WBCs.  Urine culture showed no growth. CT imaging from 11/09/2021 showed a 3 cm left lower pole calculus with stranding along the left lower pole renal sinus and renal pelvis suggesting reactive changes, no evidence of obstruction. Stone analysis cysts from 11/09/2021: 10% calcium oxalate, 90% uric acid 24-hour urine from 11/29/2021 showed a urine volume of 2.5 L, elevated urine oxalate, elevated urinary citrate, pH of 5.4, elevated uric acid of 938 and elevated urine sodium of 256. He was started on potassium citrate for urinary alkalinization.  He returns today for follow-up.  He is having fairly constant left-sided flank pain.  He is passing numerous stone fragments.  He has noted some pain in the penile area in the past 24 hours.  No recent dysuria or gross hematuria.  Portions of the above documentation were copied from a prior visit for review purposes only.   Past Medical History:  Past Medical History:  Diagnosis Date   Gout    H/O knee surgery    Kidney stones     Past Surgical History:  Past Surgical History:  Procedure Laterality Date   ADENOIDECTOMY     ANTERIOR CRUCIATE LIGAMENT REPAIR     TYMPANOSTOMY TUBE PLACEMENT      Allergies:  Allergies  Allergen Reactions   Sulfonamide Derivatives  Swelling and Rash    Family History:  No family history on file.  Social History:  Social History   Tobacco Use   Smoking status: Some Days    Packs/day: 1.00    Types: Cigarettes   Smokeless tobacco: Current    Types: Snuff  Substance Use Topics   Alcohol use: No   Drug use: No    ROS: Constitutional:  Negative for fever, chills, weight loss CV: Negative for chest pain, previous MI,  hypertension Respiratory:  Negative for shortness of breath, wheezing, sleep apnea, frequent cough GI:  Negative for nausea, vomiting, bloody stool, GERD  Physical exam: BP (!) 167/100   Pulse (!) 116   Ht 5\' 11"  (1.803 m)   Wt (!) 375 lb (170.1 kg)   BMI 52.30 kg/m  GENERAL APPEARANCE:  Well appearing, well developed, well nourished, NAD HEENT:  Atraumatic, normocephalic, oropharynx clear NECK:  Supple without lymphadenopathy or thyromegaly ABDOMEN:  Soft, non-tender, no masses EXTREMITIES:  Moves all extremities well, without clubbing, cyanosis, or edema NEUROLOGIC:  Alert and oriented x 3, normal gait, CN II-XII grossly intact MENTAL STATUS:  appropriate BACK:  Non-tender to palpation, No CVAT SKIN:  Warm, dry, and intact  Results: U/A: 0 WBCs, 11-30 RBCs, few bacteria, pH 5.0

## 2021-12-21 ENCOUNTER — Telehealth: Payer: Self-pay

## 2021-12-21 LAB — BASIC METABOLIC PANEL
BUN/Creatinine Ratio: 13 (ref 9–20)
BUN: 10 mg/dL (ref 6–20)
CO2: 23 mmol/L (ref 20–29)
Calcium: 9.5 mg/dL (ref 8.7–10.2)
Chloride: 100 mmol/L (ref 96–106)
Creatinine, Ser: 0.76 mg/dL (ref 0.76–1.27)
Glucose: 318 mg/dL — ABNORMAL HIGH (ref 70–99)
Potassium: 4.6 mmol/L (ref 3.5–5.2)
Sodium: 140 mmol/L (ref 134–144)
eGFR: 123 mL/min/{1.73_m2} (ref >59)

## 2021-12-21 LAB — URIC ACID: Uric Acid: 6.5 mg/dL (ref 3.8–8.4)

## 2021-12-21 MED ORDER — ALLOPURINOL 100 MG PO TABS: 100.0000 mg | ORAL_TABLET | Freq: Every day | ORAL | 5 refills | Status: AC

## 2021-12-21 NOTE — Telephone Encounter (Signed)
-----   Message from Primus Bravo, MD sent at 12/21/2021  9:21 AM EDT ----- Please notify the patient that his lab work shows normal kidney function.  His uric acid is within the normal range, but given his history of gout I would like for him to start allopurinol 100 mg daily.  Prescription sent.  Keep follow-up as scheduled.

## 2021-12-21 NOTE — Addendum Note (Signed)
Addended by: Primus Bravo on: 12/21/2021 09:22 AM   Modules accepted: Orders

## 2021-12-21 NOTE — Telephone Encounter (Signed)
Patient aware of MD response and voiced understanding.  

## 2021-12-31 ENCOUNTER — Ambulatory Visit (HOSPITAL_COMMUNITY)
Admission: RE | Admit: 2021-12-31 | Discharge: 2021-12-31 | Disposition: A | Discharge status: Home / Self Care | Payer: Self-pay | Source: Ambulatory Visit | Attending: Urology | Admitting: Urology

## 2021-12-31 ENCOUNTER — Encounter: Payer: Self-pay | Admitting: Urology

## 2021-12-31 ENCOUNTER — Ambulatory Visit (INDEPENDENT_AMBULATORY_CARE_PROVIDER_SITE_OTHER): Payer: Self-pay | Admitting: Urology

## 2021-12-31 ENCOUNTER — Telehealth: Payer: Self-pay

## 2021-12-31 VITALS — BP 170/95 | HR 90 | Ht 71.0 in | Wt 375.0 lb

## 2021-12-31 DIAGNOSIS — N200 Calculus of kidney: Secondary | ICD-10-CM

## 2021-12-31 DIAGNOSIS — R109 Unspecified abdominal pain: Secondary | ICD-10-CM

## 2021-12-31 MED ORDER — OXYCODONE HCL 5 MG PO TABS: 5.0000 mg | ORAL_TABLET | Freq: Four times a day (QID) | ORAL | 0 refills | Status: DC | PRN

## 2021-12-31 MED ORDER — SODIUM BICARBONATE 650 MG PO TABS: 650.0000 mg | ORAL_TABLET | Freq: Two times a day (BID) | ORAL | 2 refills | Status: DC

## 2021-12-31 NOTE — Telephone Encounter (Signed)
Patient aware of MD response to KUB and voiced understanding.

## 2021-12-31 NOTE — Telephone Encounter (Signed)
-----   Message from Milderd Meager, MD sent at 12/31/2021  1:06 PM EST ----- Please notify patient that his xray does not show the left kidney stone suggesting a uric acid stone.  Recommend continued treatment as discussed.

## 2021-12-31 NOTE — Progress Notes (Signed)
Assessment: 1. Nephrolithiasis   2. Flank pain, left     Plan: He has been taking potassium citrate twice daily but his urine pH remains low. Begin sodium bicarbonate 650 mg BID for urinary alkalinization. Continue allopurinol  Prescription for pain medication provided. KUB today I discussed management options for the left renal calculus with the patient today including continued urinary alkalinization in hopes of stone dissolution, ureteroscopy with laser lithotripsy and stent insertion, and percutaneous nephrolithotomy. He like to try nonsurgical management at this time.  Patient advised to seek medical attention for fever, chills, uncontrolled pain, or intractable vomiting. Return to office in 2 weeks.  Chief Complaint:  Chief Complaint  Patient presents with   Nephrolithiasis    History of Present Illness:  Chad Weaver is a 31 y.o. male who is seen for further evaluation of left flank pain and nephrolithiasis.   He has a prior history of kidney stones for about 10 years.  He reports intermittent left-sided flank pain for approximately 6 months. CT imaging from March 2023 showed a 2 mm calculus at the left UPJ with mild to moderate hydronephrosis and a 5 mm left renal calculus.  He reports passing stones following the CT scan.  Most recently, he has been passing gravel.  He does have associated gross hematuria with these episodes.  He also reports occasional nausea without vomiting.  No fevers or chills.  No prior stone analysis available.  He has not required surgical intervention for his stones to date.  He has a history of gout.  He is not currently on any medical therapy for gout. Review of urinalysis from March 2023 showed a pH of 5.0, >50 RBCs, 21-50 WBCs.  Urine culture showed no growth. CT imaging from 11/09/2021 showed a 3 cm left lower pole calculus with stranding along the left lower pole renal sinus and renal pelvis suggesting reactive changes, no evidence of  obstruction. Stone analysis cysts from 11/09/2021: 10% calcium oxalate, 90% uric acid 24-hour urine from 11/29/2021 showed a urine volume of 2.5 L, elevated urine oxalate, elevated urinary citrate, pH of 5.4, elevated uric acid of 938 and elevated urine sodium of 256. He was started on potassium citrate for urinary alkalinization. At his visit on 12/20/21, he was having fairly constant left-sided flank pain.  He was passing numerous stone fragments.  He noted some pain in the penile area x 24 hours.  No recent dysuria or gross hematuria. Urine pH: 5.0 BMP showed a normal creatinine and his uric acid was normal at 6.5. He was started on urinary alkalinization with baking soda as well as allopurinol.  He returns today for follow-up.  He was unable to tolerate the baking soda.  He continues on potassiums citrate and allopurinol.  He continues to have intermittent left-sided flank pain.  He reports this is noticeable after riding in a car or standing for periods of time.  He is not having pain with sitting or lying down.  No dysuria or gross hematuria.  Portions of the above documentation were copied from a prior visit for review purposes only.   Past Medical History:  Past Medical History:  Diagnosis Date   Gout    H/O knee surgery    Kidney stones     Past Surgical History:  Past Surgical History:  Procedure Laterality Date   ADENOIDECTOMY     ANTERIOR CRUCIATE LIGAMENT REPAIR     TYMPANOSTOMY TUBE PLACEMENT      Allergies:  Allergies  Allergen Reactions   Sulfonamide Derivatives Swelling and Rash    Family History:  No family history on file.  Social History:  Social History   Tobacco Use   Smoking status: Some Days    Packs/day: 1.00    Types: Cigarettes   Smokeless tobacco: Current    Types: Snuff  Substance Use Topics   Alcohol use: No   Drug use: No    ROS: Constitutional:  Negative for fever, chills, weight loss CV: Negative for chest pain, previous MI,  hypertension Respiratory:  Negative for shortness of breath, wheezing, sleep apnea, frequent cough GI:  Negative for nausea, vomiting, bloody stool, GERD  Physical exam: BP (!) 170/95   Pulse 90   Ht 5\' 11"  (1.803 m)   Wt (!) 375 lb (170.1 kg)   BMI 52.30 kg/m  GENERAL APPEARANCE:  Well appearing, well developed, well nourished, NAD HEENT:  Atraumatic, normocephalic, oropharynx clear NECK:  Supple without lymphadenopathy or thyromegaly ABDOMEN:  Soft, non-tender, no masses EXTREMITIES:  Moves all extremities well, without clubbing, cyanosis, or edema NEUROLOGIC:  Alert and oriented x 3, normal gait, CN II-XII grossly intact MENTAL STATUS:  appropriate BACK:  Non-tender to palpation, No CVAT SKIN:  Warm, dry, and intact  Results: U/A: pH 5.0 6-10 WBCs, >30 RBCs, moderate bacteria

## 2022-01-01 LAB — MICROSCOPIC EXAMINATION: RBC, Urine: >30 /hpf — AB (ref 0–2)

## 2022-01-01 LAB — URINALYSIS, ROUTINE W REFLEX MICROSCOPIC
Bilirubin, UA: NEGATIVE
Leukocytes,UA: NEGATIVE
Nitrite, UA: NEGATIVE
Specific Gravity, UA: 1.03 (ref 1.005–1.030)
Urobilinogen, Ur: 0.2 mg/dL (ref 0.2–1.0)
pH, UA: 5 (ref 5.0–7.5)

## 2022-01-14 ENCOUNTER — Encounter: Payer: Self-pay | Admitting: Urology

## 2022-01-14 ENCOUNTER — Ambulatory Visit (INDEPENDENT_AMBULATORY_CARE_PROVIDER_SITE_OTHER): Payer: Self-pay | Admitting: Urology

## 2022-01-14 VITALS — BP 144/75 | HR 92 | Ht 71.0 in | Wt 375.0 lb

## 2022-01-14 DIAGNOSIS — N200 Calculus of kidney: Secondary | ICD-10-CM

## 2022-01-14 DIAGNOSIS — R109 Unspecified abdominal pain: Secondary | ICD-10-CM

## 2022-01-14 LAB — URINALYSIS, ROUTINE W REFLEX MICROSCOPIC
Bilirubin, UA: NEGATIVE
Leukocytes,UA: NEGATIVE
Nitrite, UA: NEGATIVE
Specific Gravity, UA: 1.025 (ref 1.005–1.030)
Urobilinogen, Ur: 0.2 mg/dL (ref 0.2–1.0)
pH, UA: 5 (ref 5.0–7.5)

## 2022-01-14 LAB — MICROSCOPIC EXAMINATION
Bacteria, UA: NONE SEEN
RBC, Urine: >30 /hpf — AB (ref 0–2)
WBC, UA: NONE SEEN /hpf (ref 0–5)

## 2022-01-14 MED ORDER — OXYCODONE HCL 5 MG PO TABS: 5.0000 mg | ORAL_TABLET | Freq: Four times a day (QID) | ORAL | 0 refills | Status: DC | PRN

## 2022-01-14 NOTE — H&P (View-Only) (Signed)
Assessment: 1. Nephrolithiasis   2. Flank pain, left     Plan: Continue sodium bicarbonate 650 mg BID and potassium citrate for urinary alkalinization. Continue allopurinol  Prescription for pain medication provided. I discussed management options for the left renal calculus with the patient today including continued urinary alkalinization in hopes of stone dissolution, ureteroscopy with laser lithotripsy and stent insertion, and percutaneous nephrolithotomy.  Procedure: The patient will be scheduled for cystoscopy, bilateral retrograde pyelograms, possible left ureteroscopic laser lithotripsy, and insertion of left ureteral stent at [facility].  Surgical request is placed with the surgery schedulers and will be scheduled at the patient's/family request. Informed consent is given as documented below. Anesthesia: General  The patient does not have sleep apnea, history of MRSA, history of VRE, history of cardiac device requiring special anesthetic needs. Patient is stable and considered clear for surgical in an outpatient ambulatory surgery setting as well as patient hospital setting.  Consent for Operation or Procedure: Provider Certification I hereby certify that the nature, purpose, benefits, usual and most frequent risks of, and alternatives to, the operation or procedure have been explained to the patient (or person authorized to sign for the patient) either by me as responsible physician or by the provider who is to perform the operation or procedure. Time spent such that the patient/family has had an opportunity to ask questions, and that those questions have been answered. The patient or the patient's representative has been advised that selected tasks may be performed by assistants to the primary health care provider(s). I believe that the patient (or person authorized to sign for the patient) understands what has been explained, and has consented to the operation or procedure. No  guarantees were implied or made.  Chief Complaint:  Chief Complaint  Patient presents with   Nephrolithiasis    History of Present Illness:  Chad Weaver is a 31 y.o. male who is seen for further evaluation of left flank pain and nephrolithiasis.   He has a prior history of kidney stones for about 10 years.  He reports intermittent left-sided flank pain for approximately 6 months. CT imaging from March 2023 showed a 2 mm calculus at the left UPJ with mild to moderate hydronephrosis and a 5 mm left renal calculus.  He reports passing stones following the CT scan.  Most recently, he has been passing gravel.  He does have associated gross hematuria with these episodes.  He also reports occasional nausea without vomiting.  No fevers or chills.  No prior stone analysis available.  He has not required surgical intervention for his stones to date.  He has a history of gout.  He is not currently on any medical therapy for gout. Review of urinalysis from March 2023 showed a pH of 5.0, >50 RBCs, 21-50 WBCs.  Urine culture showed no growth. CT imaging from 11/09/2021 showed a 3 cm left lower pole calculus with stranding along the left lower pole renal sinus and renal pelvis suggesting reactive changes, no evidence of obstruction. Stone analysis cysts from 11/09/2021: 10% calcium oxalate, 90% uric acid 24-hour urine from 11/29/2021 showed a urine volume of 2.5 L, elevated urine oxalate, elevated urinary citrate, pH of 5.4, elevated uric acid of 938 and elevated urine sodium of 256. He was started on potassium citrate for urinary alkalinization. At his visit on 12/20/21, he was having fairly constant left-sided flank pain.  He was passing numerous stone fragments.  He noted some pain in the penile area x 24  hours.  No recent dysuria or gross hematuria. Urine pH: 5.0 BMP showed a normal creatinine and his uric acid was normal at 6.5. He was started on urinary alkalinization with baking soda as well as  allopurinol. He was unable to tolerate the baking soda.  He continued on potassium citrate and allopurinol.   He continued to have intermittent left-sided flank pain, more noticeable after riding in a car or standing for periods of time.  No pain with sitting or lying down.  No dysuria or gross hematuria. The known left renal calculus was not visualized on a KUB from 12/31/2021 consistent with a uric acid stone. He was started on sodium bicarbonate 650 mg twice daily.  He returns today for follow-up.  He continues on potassiums citrate and sodium bicarbonate.  He reports that his left-sided flank pain is increasing in frequency and severity.  He has run out of his pain medication.  No fevers or chills.  No nausea or vomiting.  He has noted some gross hematuria.  Portions of the above documentation were copied from a prior visit for review purposes only.   Past Medical History:  Past Medical History:  Diagnosis Date   Gout    H/O knee surgery    Kidney stones     Past Surgical History:  Past Surgical History:  Procedure Laterality Date   ADENOIDECTOMY     ANTERIOR CRUCIATE LIGAMENT REPAIR     TYMPANOSTOMY TUBE PLACEMENT      Allergies:  Allergies  Allergen Reactions   Sulfonamide Derivatives Swelling and Rash    Family History:  No family history on file.  Social History:  Social History   Tobacco Use   Smoking status: Some Days    Packs/day: 1.00    Types: Cigarettes   Smokeless tobacco: Current    Types: Snuff  Substance Use Topics   Alcohol use: No   Drug use: No    ROS: Constitutional:  Negative for fever, chills, weight loss CV: Negative for chest pain, previous MI, hypertension Respiratory:  Negative for shortness of breath, wheezing, sleep apnea, frequent cough GI:  Negative for nausea, vomiting, bloody stool, GERD  Physical exam: BP (!) 144/75   Pulse 92   Ht 5\' 11"  (1.803 m)   Wt (!) 375 lb (170.1 kg)   BMI 52.30 kg/m  GENERAL APPEARANCE:  Well  appearing, well developed, well nourished, NAD HEENT:  Atraumatic, normocephalic, oropharynx clear NECK:  Supple without lymphadenopathy or thyromegaly ABDOMEN:  Soft, non-tender, no masses EXTREMITIES:  Moves all extremities well, without clubbing, cyanosis, or edema NEUROLOGIC:  Alert and oriented x 3, normal gait, CN II-XII grossly intact MENTAL STATUS:  appropriate BACK:  Non-tender to palpation, No CVAT SKIN:  Warm, dry, and intact  Results: U/A: pH 5.0, >30 RBC

## 2022-01-14 NOTE — Progress Notes (Signed)
Assessment: 1. Nephrolithiasis   2. Flank pain, left     Plan: Continue sodium bicarbonate 650 mg BID and potassium citrate for urinary alkalinization. Continue allopurinol  Prescription for pain medication provided. I discussed management options for the left renal calculus with the patient today including continued urinary alkalinization in hopes of stone dissolution, ureteroscopy with laser lithotripsy and stent insertion, and percutaneous nephrolithotomy.  Procedure: The patient will be scheduled for cystoscopy, bilateral retrograde pyelograms, possible left ureteroscopic laser lithotripsy, and insertion of left ureteral stent at [facility].  Surgical request is placed with the surgery schedulers and will be scheduled at the patient's/family request. Informed consent is given as documented below. Anesthesia: General  The patient does not have sleep apnea, history of MRSA, history of VRE, history of cardiac device requiring special anesthetic needs. Patient is stable and considered clear for surgical in an outpatient ambulatory surgery setting as well as patient hospital setting.  Consent for Operation or Procedure: Provider Certification I hereby certify that the nature, purpose, benefits, usual and most frequent risks of, and alternatives to, the operation or procedure have been explained to the patient (or person authorized to sign for the patient) either by me as responsible physician or by the provider who is to perform the operation or procedure. Time spent such that the patient/family has had an opportunity to ask questions, and that those questions have been answered. The patient or the patient's representative has been advised that selected tasks may be performed by assistants to the primary health care provider(s). I believe that the patient (or person authorized to sign for the patient) understands what has been explained, and has consented to the operation or procedure. No  guarantees were implied or made.  Chief Complaint:  Chief Complaint  Patient presents with   Nephrolithiasis    History of Present Illness:  Chad Weaver is a 31 y.o. male who is seen for further evaluation of left flank pain and nephrolithiasis.   He has a prior history of kidney stones for about 10 years.  He reports intermittent left-sided flank pain for approximately 6 months. CT imaging from March 2023 showed a 2 mm calculus at the left UPJ with mild to moderate hydronephrosis and a 5 mm left renal calculus.  He reports passing stones following the CT scan.  Most recently, he has been passing gravel.  He does have associated gross hematuria with these episodes.  He also reports occasional nausea without vomiting.  No fevers or chills.  No prior stone analysis available.  He has not required surgical intervention for his stones to date.  He has a history of gout.  He is not currently on any medical therapy for gout. Review of urinalysis from March 2023 showed a pH of 5.0, >50 RBCs, 21-50 WBCs.  Urine culture showed no growth. CT imaging from 11/09/2021 showed a 3 cm left lower pole calculus with stranding along the left lower pole renal sinus and renal pelvis suggesting reactive changes, no evidence of obstruction. Stone analysis cysts from 11/09/2021: 10% calcium oxalate, 90% uric acid 24-hour urine from 11/29/2021 showed a urine volume of 2.5 L, elevated urine oxalate, elevated urinary citrate, pH of 5.4, elevated uric acid of 938 and elevated urine sodium of 256. He was started on potassium citrate for urinary alkalinization. At his visit on 12/20/21, he was having fairly constant left-sided flank pain.  He was passing numerous stone fragments.  He noted some pain in the penile area x 24  hours.  No recent dysuria or gross hematuria. Urine pH: 5.0 BMP showed a normal creatinine and his uric acid was normal at 6.5. He was started on urinary alkalinization with baking soda as well as  allopurinol. He was unable to tolerate the baking soda.  He continued on potassium citrate and allopurinol.   He continued to have intermittent left-sided flank pain, more noticeable after riding in a car or standing for periods of time.  No pain with sitting or lying down.  No dysuria or gross hematuria. The known left renal calculus was not visualized on a KUB from 12/31/2021 consistent with a uric acid stone. He was started on sodium bicarbonate 650 mg twice daily.  He returns today for follow-up.  He continues on potassiums citrate and sodium bicarbonate.  He reports that his left-sided flank pain is increasing in frequency and severity.  He has run out of his pain medication.  No fevers or chills.  No nausea or vomiting.  He has noted some gross hematuria.  Portions of the above documentation were copied from a prior visit for review purposes only.   Past Medical History:  Past Medical History:  Diagnosis Date   Gout    H/O knee surgery    Kidney stones     Past Surgical History:  Past Surgical History:  Procedure Laterality Date   ADENOIDECTOMY     ANTERIOR CRUCIATE LIGAMENT REPAIR     TYMPANOSTOMY TUBE PLACEMENT      Allergies:  Allergies  Allergen Reactions   Sulfonamide Derivatives Swelling and Rash    Family History:  No family history on file.  Social History:  Social History   Tobacco Use   Smoking status: Some Days    Packs/day: 1.00    Types: Cigarettes   Smokeless tobacco: Current    Types: Snuff  Substance Use Topics   Alcohol use: No   Drug use: No    ROS: Constitutional:  Negative for fever, chills, weight loss CV: Negative for chest pain, previous MI, hypertension Respiratory:  Negative for shortness of breath, wheezing, sleep apnea, frequent cough GI:  Negative for nausea, vomiting, bloody stool, GERD  Physical exam: BP (!) 144/75   Pulse 92   Ht 5\' 11"  (1.803 m)   Wt (!) 375 lb (170.1 kg)   BMI 52.30 kg/m  GENERAL APPEARANCE:  Well  appearing, well developed, well nourished, NAD HEENT:  Atraumatic, normocephalic, oropharynx clear NECK:  Supple without lymphadenopathy or thyromegaly ABDOMEN:  Soft, non-tender, no masses EXTREMITIES:  Moves all extremities well, without clubbing, cyanosis, or edema NEUROLOGIC:  Alert and oriented x 3, normal gait, CN II-XII grossly intact MENTAL STATUS:  appropriate BACK:  Non-tender to palpation, No CVAT SKIN:  Warm, dry, and intact  Results: U/A: pH 5.0, >30 RBC

## 2022-01-15 NOTE — Patient Instructions (Signed)
Chad Weaver  01/15/2022     @PREFPERIOPPHARMACY @   Your procedure is scheduled on  01/17/2022.   Report to 01/19/2022 at  1130  A.M.   Call this number if you have problems the morning of surgery:  [phone]  If you experience any cold or flu symptoms such as cough, fever, chills, shortness of breath, etc. between now and your scheduled surgery, please notify [phone] at the above number.   Remember:  Do not eat or drink after midnight.      Take these medicines the morning of surgery with A SIP OF WATER                  allopurinol, oxycodone(if needed).    Do not wear jewelry, make-up or nail polish.  Do not wear lotions, powders, or perfumes, or deodorant.  Do not shave 48 hours prior to surgery.  Men may shave face and neck.  Do not bring valuables to the hospital.  [facility] is not responsible for any belongings or valuables.  Contacts, dentures or bridgework may not be worn into surgery.  Leave your suitcase in the car.  After surgery it may be brought to your room.  For patients admitted to the hospital, discharge time will be determined by your treatment team.  Patients discharged the day of surgery will not be allowed to drive home and must have someone with them for 24 hours.    Special instructions:   DO NOT smoke tobacco or vape for 24 hours before your procedure.  Please read over the following fact sheets that you were given. Coughing and Deep Breathing, Surgical Site Infection Prevention, Anesthesia Post-op Instructions, and Care and Recovery After Surgery      Ureteral Stent Implantation, Care After The following information offers guidance on how to care for yourself after your procedure. Your health care provider may also give you more specific instructions. If you have problems or questions, contact your health care provider. What can I expect after the procedure? After the procedure, it is common to have: Nausea. Mild pain when you  urinate. You may feel this pain in your lower back or lower abdomen. The pain should stop within a few minutes after you urinate. This pattern may last for up to 1 week. A small amount of blood in your urine for several days. Follow these instructions at home: Medicines Take over-the-counter and prescription medicines only as told by your health care provider. If you were prescribed antibiotics, take them as told by your health care provider. Do not stop using the antibiotic even if you start to feel better. If you were given a sedative during the procedure, it can affect you for several hours. Do not drive or operate machinery until your health care provider says that it is safe. Ask your health care provider if the medicine prescribed to you: Requires you to avoid driving or using machinery. Can cause constipation. You may need to take these actions to prevent or treat constipation: Take over-the-counter or prescription medicines. Eat foods that are high in fiber, such as beans, whole grains, and fresh fruits and vegetables. Limit foods that are high in fat and processed sugars, such as fried or sweet foods. Activity Rest as told by your health care provider. Do not sit for a long time without moving. Get up to take short walks every 1-2 hours. This will improve blood flow and breathing. Ask for help if  you feel weak or unsteady. Return to your normal activities as told by your health care provider. Ask your health care provider what activities are safe for you. General instructions  If you have a catheter: Follow instructions from your health care provider about taking care of your catheter and collection bag. Do not take baths, swim, or use a hot tub until your health care provider approves. Ask your health care provider if you may take showers. You may only be allowed to take sponge baths. Drink enough fluid to keep your urine pale yellow. Do not use any products that contain nicotine or  tobacco. These products include cigarettes, chewing tobacco, and vaping devices, such as e-cigarettes. These can delay healing after surgery. If you need help quitting, ask your health care provider. Keep all follow-up visits. Contact a health care provider if: You start passing blood clots, or you have more than a small amount of blood in your urine. You have pain that gets worse or does not get better with medicine, especially pain when you urinate. You have trouble urinating. You feel nauseous or you vomit again and again during a period of more than 2 days after the procedure. You have a fever. Get help right away if: You are passing blood clots that are 1 inch (2.5 cm) or larger in size. You are leaking urine (have incontinence), or you cannot urinate. The end of the stent comes out of your urethra. You have sudden, sharp, or severe pain in your abdomen or lower back. You have swelling or pain in your legs. You have trouble breathing. These symptoms may be an emergency. Get help right away. Call 911. Do not wait to see if the symptoms will go away. Do not drive yourself to the hospital. Summary After the procedure, it is common to have mild pain when you urinate that goes away within a few minutes after you urinate. This may last for up to 1 week. Take over-the-counter and prescription medicines only as told by your health care provider. Drink enough fluid to keep your urine pale yellow. Call your health care provider if you start passing blood clots, or you have more than a small amount of blood in your urine. This information is not intended to replace advice given to you by your health care provider. Make sure you discuss any questions you have with your health care provider. Document Revised: 03/12/2021 Document Reviewed: 03/12/2021 Elsevier Patient Education  2023 [employer] General Anesthesia, Adult, Care After The following information offers guidance on how to care for  yourself after your procedure. Your health care provider may also give you more specific instructions. If you have problems or questions, contact your health care provider. What can I expect after the procedure? After the procedure, it is common for people to: Have pain or discomfort at the IV site. Have nausea or vomiting. Have a sore throat or hoarseness. Have trouble concentrating. Feel cold or chills. Feel weak, sleepy, or tired (fatigue). Have soreness and body aches. These can affect parts of the body that were not involved in surgery. Follow these instructions at home: For the time period you were told by your health care provider:  Rest. Do not participate in activities where you could fall or become injured. Do not drive or use machinery. Do not drink alcohol. Do not take sleeping pills or medicines that cause drowsiness. Do not make important decisions or sign legal documents. Do not take care of children on your own.  General instructions Drink enough fluid to keep your urine pale yellow. If you have sleep apnea, surgery and certain medicines can increase your risk for breathing problems. Follow instructions from your health care provider about wearing your sleep device: Anytime you are sleeping, including during daytime naps. While taking prescription pain medicines, sleeping medicines, or medicines that make you drowsy. Return to your normal activities as told by your health care provider. Ask your health care provider what activities are safe for you. Take over-the-counter and prescription medicines only as told by your health care provider. Do not use any products that contain nicotine or tobacco. These products include cigarettes, chewing tobacco, and vaping devices, such as e-cigarettes. These can delay incision healing after surgery. If you need help quitting, ask your health care provider. Contact a health care provider if: You have nausea or vomiting that does not get  better with medicine. You vomit every time you eat or drink. You have pain that does not get better with medicine. You cannot urinate or have bloody urine. You develop a skin rash. You have a fever. Get help right away if: You have trouble breathing. You have chest pain. You vomit blood. These symptoms may be an emergency. Get help right away. Call 911. Do not wait to see if the symptoms will go away. Do not drive yourself to the hospital. Summary After the procedure, it is common to have a sore throat, hoarseness, nausea, vomiting, or to feel weak, sleepy, or fatigue. For the time period you were told by your health care provider, do not drive or use machinery. Get help right away if you have difficulty breathing, have chest pain, or vomit blood. These symptoms may be an emergency. This information is not intended to replace advice given to you by your health care provider. Make sure you discuss any questions you have with your health care provider. Document Revised: 05/04/2021 Document Reviewed: 05/04/2021 Elsevier Patient Education  2023 [employer].

## 2022-01-16 ENCOUNTER — Encounter (HOSPITAL_COMMUNITY): Payer: Self-pay

## 2022-01-16 ENCOUNTER — Encounter (HOSPITAL_COMMUNITY)
Admission: RE | Admit: 2022-01-16 | Discharge: 2022-01-16 | Disposition: A | Discharge status: Home / Self Care | Payer: Self-pay | Source: Ambulatory Visit | Attending: Urology | Admitting: Urology

## 2022-01-16 VITALS — BP 141/99 | HR 90 | Temp 97.5°F | Resp 18 | Ht 71.0 in | Wt 375.0 lb

## 2022-01-16 DIAGNOSIS — Z01818 Encounter for other preprocedural examination: Secondary | ICD-10-CM | POA: Insufficient documentation

## 2022-01-16 DIAGNOSIS — R638 Other symptoms and signs concerning food and fluid intake: Secondary | ICD-10-CM | POA: Insufficient documentation

## 2022-01-16 DIAGNOSIS — R9431 Abnormal electrocardiogram [ECG] [EKG]: Secondary | ICD-10-CM | POA: Insufficient documentation

## 2022-01-16 DIAGNOSIS — R739 Hyperglycemia, unspecified: Secondary | ICD-10-CM | POA: Insufficient documentation

## 2022-01-16 HISTORY — DX: Personal history of urinary calculi: Z87.442

## 2022-01-16 HISTORY — DX: Gastro-esophageal reflux disease without esophagitis: K21.9

## 2022-01-16 LAB — BASIC METABOLIC PANEL
Anion gap: 11 (ref 5–15)
BUN: 15 mg/dL (ref 6–20)
CO2: 24 mmol/L (ref 22–32)
Calcium: 8.6 mg/dL — ABNORMAL LOW (ref 8.9–10.3)
Chloride: 99 mmol/L (ref 98–111)
Creatinine, Ser: 0.84 mg/dL (ref 0.61–1.24)
GFR, Estimated: >60 mL/min (ref >60)
Glucose, Bld: 241 mg/dL — ABNORMAL HIGH (ref 70–99)
Potassium: 4.2 mmol/L (ref 3.5–5.1)
Sodium: 134 mmol/L — ABNORMAL LOW (ref 135–145)

## 2022-01-17 ENCOUNTER — Encounter (HOSPITAL_COMMUNITY)
Admission: RE | Disposition: A | Discharge status: Home / Self Care | Payer: Self-pay | Source: Home / Self Care | Attending: Urology

## 2022-01-17 ENCOUNTER — Ambulatory Visit (HOSPITAL_COMMUNITY)
Admission: RE | Admit: 2022-01-17 | Discharge: 2022-01-17 | Disposition: A | Discharge status: Home / Self Care | Payer: Self-pay | Attending: Urology | Admitting: Urology

## 2022-01-17 ENCOUNTER — Ambulatory Visit (HOSPITAL_COMMUNITY): Payer: Self-pay

## 2022-01-17 ENCOUNTER — Ambulatory Visit (HOSPITAL_BASED_OUTPATIENT_CLINIC_OR_DEPARTMENT_OTHER): Payer: Self-pay | Admitting: Anesthesiology

## 2022-01-17 ENCOUNTER — Ambulatory Visit (HOSPITAL_COMMUNITY): Payer: Self-pay | Admitting: Anesthesiology

## 2022-01-17 ENCOUNTER — Encounter (HOSPITAL_COMMUNITY): Payer: Self-pay | Admitting: Urology

## 2022-01-17 DIAGNOSIS — N200 Calculus of kidney: Secondary | ICD-10-CM

## 2022-01-17 DIAGNOSIS — F419 Anxiety disorder, unspecified: Secondary | ICD-10-CM | POA: Insufficient documentation

## 2022-01-17 DIAGNOSIS — Z6841 Body Mass Index (BMI) 40.0 and over, adult: Secondary | ICD-10-CM | POA: Insufficient documentation

## 2022-01-17 DIAGNOSIS — Z87442 Personal history of urinary calculi: Secondary | ICD-10-CM | POA: Insufficient documentation

## 2022-01-17 DIAGNOSIS — R109 Unspecified abdominal pain: Secondary | ICD-10-CM | POA: Insufficient documentation

## 2022-01-17 DIAGNOSIS — R739 Hyperglycemia, unspecified: Secondary | ICD-10-CM

## 2022-01-17 DIAGNOSIS — N132 Hydronephrosis with renal and ureteral calculous obstruction: Secondary | ICD-10-CM | POA: Insufficient documentation

## 2022-01-17 DIAGNOSIS — K219 Gastro-esophageal reflux disease without esophagitis: Secondary | ICD-10-CM | POA: Insufficient documentation

## 2022-01-17 DIAGNOSIS — R638 Other symptoms and signs concerning food and fluid intake: Secondary | ICD-10-CM

## 2022-01-17 DIAGNOSIS — F17210 Nicotine dependence, cigarettes, uncomplicated: Secondary | ICD-10-CM | POA: Insufficient documentation

## 2022-01-17 HISTORY — PX: CYSTOSCOPY WITH RETROGRADE PYELOGRAM, URETEROSCOPY AND STENT PLACEMENT: SHX5789

## 2022-01-17 HISTORY — PX: CYSTOSCOPY W/ URETERAL STENT PLACEMENT: SHX1429

## 2022-01-17 LAB — HEMOGLOBIN A1C
Hgb A1c MFr Bld: 9.5 % — ABNORMAL HIGH (ref 4.8–5.6)
Mean Plasma Glucose: 226 mg/dL

## 2022-01-17 SURGERY — CYSTOURETEROSCOPY, WITH RETROGRADE PYELOGRAM AND STENT INSERTION
Anesthesia: General | Site: Ureter | Laterality: Left

## 2022-01-17 MED ORDER — SODIUM CHLORIDE 0.9 % IR SOLN
Status: DC | PRN
  Administered 2022-01-17: 3000 mL

## 2022-01-17 MED ORDER — LIDOCAINE HCL (CARDIAC) PF 100 MG/5ML IV SOSY
PREFILLED_SYRINGE | INTRAVENOUS | Status: DC | PRN
  Administered 2022-01-17: 100 mg via INTRAVENOUS

## 2022-01-17 MED ORDER — DEXAMETHASONE SODIUM PHOSPHATE 10 MG/ML IJ SOLN
INTRAMUSCULAR | Status: DC | PRN
  Administered 2022-01-17: 10 mg via INTRAVENOUS

## 2022-01-17 MED ORDER — CEFAZOLIN SODIUM-DEXTROSE 2-4 GM/100ML-% IV SOLN: 2.0000 g | INTRAVENOUS | Status: DC

## 2022-01-17 MED ORDER — MIDAZOLAM HCL 5 MG/5ML IJ SOLN
INTRAMUSCULAR | Status: DC | PRN
  Administered 2022-01-17: 2 mg via INTRAVENOUS

## 2022-01-17 MED ORDER — WATER FOR IRRIGATION, STERILE IR SOLN
Status: DC | PRN
  Administered 2022-01-17: 500 mL

## 2022-01-17 MED ORDER — MIDAZOLAM HCL 2 MG/2ML IJ SOLN
INTRAMUSCULAR | Status: AC
  Filled 2022-01-17: qty 2

## 2022-01-17 MED ORDER — OXYCODONE HCL 5 MG PO TABS: 5.0000 mg | ORAL_TABLET | Freq: Four times a day (QID) | ORAL | 0 refills | Status: DC | PRN

## 2022-01-17 MED ORDER — SUCCINYLCHOLINE CHLORIDE 200 MG/10ML IV SOSY
PREFILLED_SYRINGE | INTRAVENOUS | Status: DC | PRN
  Administered 2022-01-17: 160 mg via INTRAVENOUS

## 2022-01-17 MED ORDER — ONDANSETRON HCL 4 MG/2ML IJ SOLN
INTRAMUSCULAR | Status: DC | PRN
  Administered 2022-01-17: 4 mg via INTRAVENOUS

## 2022-01-17 MED ORDER — DEXAMETHASONE SODIUM PHOSPHATE 10 MG/ML IJ SOLN
INTRAMUSCULAR | Status: AC
  Filled 2022-01-17: qty 1

## 2022-01-17 MED ORDER — LACTATED RINGERS IV SOLN: INTRAVENOUS | Status: DC

## 2022-01-17 MED ORDER — ROCURONIUM BROMIDE 10 MG/ML (PF) SYRINGE
PREFILLED_SYRINGE | INTRAVENOUS | Status: AC
  Filled 2022-01-17: qty 10

## 2022-01-17 MED ORDER — POTASSIUM CITRATE ER 15 MEQ (1620 MG) PO TBCR: 2.0000 | EXTENDED_RELEASE_TABLET | Freq: Two times a day (BID) | ORAL | 3 refills | Status: DC

## 2022-01-17 MED ORDER — SUCCINYLCHOLINE CHLORIDE 200 MG/10ML IV SOSY
PREFILLED_SYRINGE | INTRAVENOUS | Status: AC
  Filled 2022-01-17: qty 10

## 2022-01-17 MED ORDER — ORAL CARE MOUTH RINSE: 15.0000 mL | Freq: Once | OROMUCOSAL | Status: AC

## 2022-01-17 MED ORDER — OXYCODONE HCL 5 MG PO TABS
ORAL_TABLET | ORAL | Status: AC
  Filled 2022-01-17: qty 1

## 2022-01-17 MED ORDER — CHLORHEXIDINE GLUCONATE 0.12 % MT SOLN
OROMUCOSAL | Status: AC
  Administered 2022-01-17: 15 mL via OROMUCOSAL
  Filled 2022-01-17: qty 15

## 2022-01-17 MED ORDER — OXYCODONE HCL 5 MG PO TABS
5.0000 mg | ORAL_TABLET | Freq: Once | ORAL | Status: AC
  Administered 2022-01-17: 5 mg via ORAL

## 2022-01-17 MED ORDER — PHENAZOPYRIDINE HCL 200 MG PO TABS: 200.0000 mg | ORAL_TABLET | Freq: Three times a day (TID) | ORAL | 1 refills | Status: AC | PRN

## 2022-01-17 MED ORDER — CEFDINIR 300 MG PO CAPS: 300.0000 mg | ORAL_CAPSULE | Freq: Two times a day (BID) | ORAL | 0 refills | Status: AC

## 2022-01-17 MED ORDER — FENTANYL CITRATE (PF) 100 MCG/2ML IJ SOLN
INTRAMUSCULAR | Status: DC | PRN
  Administered 2022-01-17: 100 ug via INTRAVENOUS
  Administered 2022-01-17: 50 ug via INTRAVENOUS
  Administered 2022-01-17: 100 ug via INTRAVENOUS

## 2022-01-17 MED ORDER — LIDOCAINE HCL URETHRAL/MUCOSAL 2 % EX GEL
CUTANEOUS | Status: AC
  Filled 2022-01-17: qty 10

## 2022-01-17 MED ORDER — LIDOCAINE HCL URETHRAL/MUCOSAL 2 % EX GEL
CUTANEOUS | Status: DC | PRN
  Administered 2022-01-17: 1 via URETHRAL

## 2022-01-17 MED ORDER — ROCURONIUM BROMIDE 100 MG/10ML IV SOLN
INTRAVENOUS | Status: DC | PRN
  Administered 2022-01-17: 80 mg via INTRAVENOUS

## 2022-01-17 MED ORDER — HYDROMORPHONE HCL 1 MG/ML IJ SOLN
0.2500 mg | INTRAMUSCULAR | Status: DC | PRN
  Administered 2022-01-17 (×2): 0.5 mg via INTRAVENOUS
  Filled 2022-01-17: qty 0.5

## 2022-01-17 MED ORDER — DIATRIZOATE MEGLUMINE 30 % UR SOLN
URETHRAL | Status: AC
  Filled 2022-01-17: qty 100

## 2022-01-17 MED ORDER — CEFAZOLIN IN SODIUM CHLORIDE 3-0.9 GM/100ML-% IV SOLN
INTRAVENOUS | Status: AC
  Filled 2022-01-17: qty 100

## 2022-01-17 MED ORDER — SUGAMMADEX SODIUM 500 MG/5ML IV SOLN
INTRAVENOUS | Status: DC | PRN
  Administered 2022-01-17: 500 mg via INTRAVENOUS
  Administered 2022-01-17: 200 mg via INTRAVENOUS

## 2022-01-17 MED ORDER — DIATRIZOATE MEGLUMINE 30 % UR SOLN
URETHRAL | Status: DC | PRN
  Administered 2022-01-17: 100 mL via URETHRAL

## 2022-01-17 MED ORDER — CHLORHEXIDINE GLUCONATE 0.12 % MT SOLN: 15.0000 mL | Freq: Once | OROMUCOSAL | Status: AC

## 2022-01-17 MED ORDER — PROPOFOL 10 MG/ML IV BOLUS
INTRAVENOUS | Status: DC | PRN
  Administered 2022-01-17: 300 mg via INTRAVENOUS

## 2022-01-17 MED ORDER — FENTANYL CITRATE (PF) 250 MCG/5ML IJ SOLN
INTRAMUSCULAR | Status: AC
  Filled 2022-01-17: qty 5

## 2022-01-17 MED ORDER — LIDOCAINE HCL (PF) 2 % IJ SOLN
INTRAMUSCULAR | Status: AC
  Filled 2022-01-17: qty 5

## 2022-01-17 MED ORDER — CEFAZOLIN IN SODIUM CHLORIDE 3-0.9 GM/100ML-% IV SOLN
3.0000 g | INTRAVENOUS | Status: AC
  Administered 2022-01-17: 3 g via INTRAVENOUS

## 2022-01-17 MED ORDER — MEPERIDINE HCL 50 MG/ML IJ SOLN: 6.2500 mg | INTRAMUSCULAR | Status: DC | PRN

## 2022-01-17 MED ORDER — ONDANSETRON HCL 4 MG/2ML IJ SOLN
INTRAMUSCULAR | Status: AC
  Filled 2022-01-17: qty 2

## 2022-01-17 MED ORDER — HYDROMORPHONE HCL 1 MG/ML IJ SOLN
INTRAMUSCULAR | Status: AC
  Filled 2022-01-17: qty 0.5

## 2022-01-17 MED ORDER — ONDANSETRON HCL 4 MG/2ML IJ SOLN: 4.0000 mg | Freq: Once | INTRAMUSCULAR | Status: DC | PRN

## 2022-01-17 SURGICAL SUPPLY — 21 items
BAG DRAIN URO TABLE W/ADPT NS (BAG) ×4 IMPLANT
BAG DRN 8 ADPR NS SKTRN CSTL (BAG) ×3
BAG HAMPER (MISCELLANEOUS) ×4 IMPLANT
CATH URET 5FR 28IN OPEN ENDED (CATHETERS) ×4 IMPLANT
CLOTH BEACON ORANGE TIMEOUT ST (SAFETY) ×4 IMPLANT
COVER MAYO STAND XLG (MISCELLANEOUS) ×4 IMPLANT
GLOVE BIO SURGEON STRL SZ8 (GLOVE) ×4 IMPLANT
GLOVE BIOGEL PI IND STRL 7.0 (GLOVE) ×8 IMPLANT
GOWN STRL REUS W/TWL LRG LVL3 (GOWN DISPOSABLE) ×4 IMPLANT
GOWN STRL REUS W/TWL XL LVL3 (GOWN DISPOSABLE) ×4 IMPLANT
GUIDEWIRE STR DUAL SENSOR (WIRE) ×4 IMPLANT
IV NS IRRIG 3000ML ARTHROMATIC (IV SOLUTION) ×8 IMPLANT
KIT TURNOVER CYSTO (KITS) ×4 IMPLANT
MANIFOLD NEPTUNE II (INSTRUMENTS) ×4 IMPLANT
PACK CYSTO (CUSTOM PROCEDURE TRAY) ×4 IMPLANT
PAD ARMBOARD 7.5X6 YLW CONV (MISCELLANEOUS) ×4 IMPLANT
STENT URET 6FRX26 CONTOUR (STENTS) ×1 IMPLANT
SYR 10ML LL (SYRINGE) ×4 IMPLANT
SYR CONTROL 10ML LL (SYRINGE) ×1 IMPLANT
TOWEL OR 17X26 4PK STRL BLUE (TOWEL DISPOSABLE) ×4 IMPLANT
WATER STERILE IRR 500ML POUR (IV SOLUTION) ×4 IMPLANT

## 2022-01-17 NOTE — Anesthesia Preprocedure Evaluation (Addendum)
Anesthesia Evaluation  Patient identified by MRN, date of birth, ID band Patient awake    Reviewed: Allergy & Precautions, NPO status , Patient's Chart, lab work & pertinent test results  Airway Mallampati: III  TM Distance: >3 FB Neck ROM: Full    Dental  (+) Dental Advisory Given, Chipped Bilateral chipped molars:   Pulmonary Current Smoker and Patient abstained from smoking.   Pulmonary exam normal breath sounds clear to auscultation       Cardiovascular Exercise Tolerance: Good negative cardio ROS Normal cardiovascular exam Rhythm:Regular Rate:Normal     Neuro/Psych  PSYCHIATRIC DISORDERS Anxiety     negative neurological ROS     GI/Hepatic ,GERD  Controlled,,(+)     substance abuse    Endo/Other    Morbid obesity  Renal/GU Renal disease (stones)     Musculoskeletal negative musculoskeletal ROS (+)  narcotic dependent  Abdominal   Peds  Hematology negative hematology ROS (+)   Anesthesia Other Findings   Reproductive/Obstetrics negative OB ROS                             Anesthesia Physical Anesthesia Plan  ASA: 3  Anesthesia Plan: General   Post-op Pain Management: Dilaudid IV   Induction:   PONV Risk Score and Plan: 3 and Ondansetron and Dexamethasone  Airway Management Planned: Oral ETT  Additional Equipment:   Intra-op Plan:   Post-operative Plan: Extubation in OR  Informed Consent: I have reviewed the patients History and Physical, chart, labs and discussed the procedure including the risks, benefits and alternatives for the proposed anesthesia with the patient or authorized representative who has indicated his/her understanding and acceptance.     Dental advisory given  Plan Discussed with: CRNA and Surgeon  Anesthesia Plan Comments:        Anesthesia Quick Evaluation

## 2022-01-17 NOTE — Anesthesia Procedure Notes (Signed)
Procedure Name: Intubation Date/Time: 01/17/2022 1:18 PM  Performed by: Shanon Payor, CRNAPre-anesthesia Checklist: Patient identified, Emergency Drugs available, Suction available, Patient being monitored and Timeout performed Patient Re-evaluated:Patient Re-evaluated prior to induction Oxygen Delivery Method: Circle system utilized Preoxygenation: Pre-oxygenation with 100% oxygen Induction Type: IV induction, Rapid sequence and Cricoid Pressure applied Grade View: Grade II Tube type: Oral Tube size: 7.5 mm Number of attempts: 1 Airway Equipment and Method: Stylet Placement Confirmation: ETT inserted through vocal cords under direct vision, positive ETCO2, CO2 detector and breath sounds checked- equal and bilateral Secured at: 24 cm Tube secured with: Tape Dental Injury: Teeth and Oropharynx as per pre-operative assessment

## 2022-01-17 NOTE — Anesthesia Postprocedure Evaluation (Signed)
Anesthesia Post Note  Patient: Chad Weaver  Procedure(s) Performed: CYSTOSCOPY WITH BILATERAL RETROGRADE PYELOGRAM (Bilateral: Ureter) CYSTOSCOPY WITH RETROGRADE PYELOGRAM/URETERAL STENT PLACEMENT (Left: Ureter)  Patient location during evaluation: Phase II Anesthesia Type: General Level of consciousness: awake and alert and oriented Pain management: pain level controlled Vital Signs Assessment: post-procedure vital signs reviewed and stable Respiratory status: spontaneous breathing, nonlabored ventilation and respiratory function stable Cardiovascular status: blood pressure returned to baseline and stable Postop Assessment: no apparent nausea or vomiting Anesthetic complications: no  No notable events documented.   Last Vitals:  Vitals:   01/17/22 1411 01/17/22 1430  BP: (!) 114/50 (!) 118/98  Pulse: (!) 106   Resp: 17 20  Temp:    SpO2: 91% 99%    Last Pain:  Vitals:   01/17/22 1430  TempSrc:   PainSc: 6                  Jaicob Dia C Ashiyah Pavlak

## 2022-01-17 NOTE — Transfer of Care (Signed)
Immediate Anesthesia Transfer of Care Note  Patient: Chad Weaver  Procedure(s) Performed: CYSTOSCOPY WITH BILATERAL RETROGRADE PYELOGRAM (Bilateral: Ureter) CYSTOSCOPY WITH RETROGRADE PYELOGRAM/URETERAL STENT PLACEMENT (Left: Ureter)  Patient Location: PACU  Anesthesia Type:General  Level of Consciousness: awake, alert , oriented, and patient cooperative  Airway & Oxygen Therapy: Patient Spontanous Breathing  Post-op Assessment: Report given to RN, Post -op Vital signs reviewed and stable, and Patient moving all extremities X 4  Post vital signs: Reviewed and stable  Last Vitals:  Vitals Value Taken Time  BP    Temp    Pulse    Resp    SpO2      Last Pain:  Vitals:   01/17/22 1223  TempSrc: Oral  PainSc:          Complications: No notable events documented.

## 2022-01-17 NOTE — Op Note (Signed)
OPERATIVE NOTE   Patient Name: Chad Weaver  MRN: [id number]   Date of Procedure: 01/17/22  Preoperative diagnosis:  Left renal calculus Left flank pain  Postoperative diagnosis:  Left renal calculus (radiolucent) Left flank pain  Procedure:  Cystoscopy Bilateral retrograde pyelograms with intraoperative interpretation Insertion of left ureteral stent (49F x 26 cm, no tether)  Attending: Milderd Meager, MD  Anesthesia: General  Estimated blood loss: None  Fluids: Per anesthesia record  Drains: 49F x 26 cm left ureteral stent without tether  Specimens: None  Antibiotics: Ancef 3 g IV  Findings: Normal urethra and bladder; 2 cm radiolucent filling defect in the left renal pelvis consistent with uric acid calculus, dilation of the left calyces  Indications:  31 year old male with a history of nephrolithiasis and recent CT scan from 11/09/2021 showing a 3 cm left lower pole calculus presents for surgical management.  A recent stone analysis showed 90% uric acid, 10% calcium oxalate stone.  He has been started on potassiums citrate and sodium bicarbonate for urinary alkalinization.  He has recently had worsening left sided flank pain.  He reports passing some small stone fragments.  He has had some intermittent gross hematuria.  No fevers or chills.  He presents now for cystoscopy, bilateral retrograde pyelograms, possible ureteroscopy with laser lithotripsy, and insertion of a left ureteral stent.  We discussed placement of the left ureteral stent to treat his left-sided flank pain secondary to obstruction from the left renal calculus and to allow continued urinary alkalinization and dissolution of the left renal calculus.  Risks, benefits, alternatives were discussed with the patient in detail.  Potential risks including, but not limited to, infection; bleeding;  injury to urethra, bladder, or ureter; possible need of other treatments; possible failure to remove the calculus;  ureteral  stricture formation; cardiac, pulmonary, cerebrovascular events; and anesthetic complications were discussed.  The patient understands and wishes to proceed.  Description of Procedure:  The patient received IV Ancef preoperatively.  After successful induction of a general anesthetic, the patient was placed in the dorsolithotomy position.  The patient's genital area was prepped and draped in sterile fashion.  Under direct visualization, a 22 French rigid cystoscope was passed through the urethra and into the bladder.  The patient's urethra was normal in appearance.  The prostate was normal.  Inspection of the bladder demonstrated no mucosal abnormalities.  A normal-appearing trigone was seen with a single orifice bilaterally.  Bilateral retrograde pyelograms were performed for the evaluation of the patient's upper tracts given his history of nephrolithiasis and flank pain.  Scout film showed a nonspecific bowel gas pattern and no obvious bony abnormalities.  No obvious calcifications were seen in the area of the left renal shadow.  Using a 5 [place] open-ended catheter, contrast was injected into the right ureter.  No filling defect or evidence of obstruction was seen.  A normal collecting system was noted on the right.  In a similar fashion, contrast was injected into the left ureter.  Again no obvious filling defect or evidence of obstruction was seen.  An approximately 2 cm filling defect was noted in the left renal pelvis.  There was some dilation of the calyces noted as well.  A sensor guidewire was placed through the open-ended catheter and into the left renal pelvis.  A 6 French by 26 cm double-J stent was then passed over the guidewire and into the left renal pelvis.  A good proximal stent curl was noted immediately adjacent to  the radiolucent stone.  A distal curl was noted as well after removal of the wire.  After placement of the stent, the bladder was drained and the cystoscope was  removed.  The patient was given intraurethral lidocaine jelly at the end of the procedure.  He was extubated and taken to the post anesthesia care unit in stable condition.  Complications: None  Condition: Stable, extubated, transferred to PACU  Plan:  Continue left ureteral stent Continue urinary alkalinization with potassiums citrate and sodium bicarbonate. Will need follow-up imaging to determine if the left ureteral calculus is decreasing in size with urinary alkalinization May need further treatment with either shockwave lithotripsy or ureteroscopic laser lithotripsy if dissolution of the calculus fails.

## 2022-01-17 NOTE — Interval H&P Note (Signed)
History and Physical Interval Note:  01/17/2022 1:02 PM  Chad Weaver  has presented today for surgery, with the diagnosis of left nephrolithiasis, left flank paind.  The various methods of treatment have been discussed with the patient and family. After consideration of risks, benefits and other options for treatment, the patient has consented to  Procedure(s): CYSTOSCOPY WITH RETROGRADE PYELOGRAM, URETEROSCOPY AND STENT PLACEMENT (Left) HOLMIUM LASER APPLICATION (Left) as a surgical intervention.  The patient's history has been reviewed, patient examined, no change in status, stable for surgery.  I have reviewed the patient's chart and labs.  Questions were answered to the patient's satisfaction.     Di Kindle

## 2022-01-21 ENCOUNTER — Encounter (HOSPITAL_COMMUNITY): Payer: Self-pay | Admitting: Urology

## 2022-01-22 ENCOUNTER — Ambulatory Visit (INDEPENDENT_AMBULATORY_CARE_PROVIDER_SITE_OTHER): Payer: Self-pay | Admitting: Urology

## 2022-01-22 ENCOUNTER — Encounter: Payer: Self-pay | Admitting: Urology

## 2022-01-22 VITALS — BP 137/83 | HR 102

## 2022-01-22 DIAGNOSIS — N200 Calculus of kidney: Secondary | ICD-10-CM

## 2022-01-22 LAB — MICROSCOPIC EXAMINATION
Bacteria, UA: NONE SEEN
RBC, Urine: >30 /hpf — AB (ref 0–2)

## 2022-01-22 LAB — URINALYSIS, ROUTINE W REFLEX MICROSCOPIC
Bilirubin, UA: NEGATIVE
Specific Gravity, UA: 1.02 (ref 1.005–1.030)
Urobilinogen, Ur: 2 mg/dL — ABNORMAL HIGH (ref 0.2–1.0)
pH, UA: 7 (ref 5.0–7.5)

## 2022-01-22 NOTE — Progress Notes (Signed)
01/22/2022 1:58 PM   Chad Weaver March 09, 1990 [id number]  Referring provider: No referring provider defined for this encounter.  Followup nephrolithiasis   HPI: Chad Weaver is a 31yo here for followup for nephrolithiasis. 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 He has uric acid and calcium oxalate stone. He is on sodium bicarbonate and potassium citrate.    PMH: Past Medical History:  Diagnosis Date   GERD (gastroesophageal reflux disease)    Gout    H/O knee surgery    History of kidney stones    Kidney stones     Surgical History: Past Surgical History:  Procedure Laterality Date   ADENOIDECTOMY     ANTERIOR CRUCIATE LIGAMENT REPAIR     ANTERIOR CRUCIATE LIGAMENT REPAIR Left    CYSTOSCOPY W/ URETERAL STENT PLACEMENT Left 01/17/2022   Procedure: CYSTOSCOPY WITH RETROGRADE PYELOGRAM/URETERAL STENT PLACEMENT;  Surgeon: Milderd Meager., MD;  Location: [facility];  Service: Urology;  Laterality: Left;   CYSTOSCOPY WITH RETROGRADE PYELOGRAM, URETEROSCOPY AND STENT PLACEMENT Bilateral 01/17/2022   Procedure: CYSTOSCOPY WITH BILATERAL RETROGRADE PYELOGRAM;  Surgeon: Milderd Meager., MD;  Location: [facility];  Service: Urology;  Laterality: Bilateral;   TYMPANOSTOMY TUBE PLACEMENT      Home Medications:  Allergies as of 01/22/2022       Reactions   Sulfonamide Derivatives Swelling, Rash        Medication List        Accurate as of January 22, 2022  1:58 PM. If you have any questions, ask your nurse or doctor.          allopurinol 100 MG tablet Commonly known as: Zyloprim Take 1 tablet (100 mg total) by mouth daily.   cefdinir 300 MG capsule Commonly known as: OMNICEF Take 1 capsule (300 mg total) by mouth 2 (two) times daily for 7 days.   oxyCODONE 5 MG immediate release tablet Commonly known as: Oxy IR/ROXICODONE Take 1 tablet (5 mg total) by mouth every 6 (six) hours as needed for severe pain.   phenazopyridine 200  MG tablet Commonly known as: Pyridium Take 1 tablet (200 mg total) by mouth 3 (three) times daily as needed for pain.   Potassium Citrate 15 MEQ (1620 MG) Tbcr Take 2 tablets by mouth 2 (two) times daily.   sodium bicarbonate 650 MG tablet Take 1 tablet (650 mg total) by mouth 2 (two) times daily.        Allergies:  Allergies  Allergen Reactions   Sulfonamide Derivatives Swelling and Rash    Family History: No family history on file.  Social History:  reports that he has been smoking cigarettes. He has been smoking an average of .5 packs per day. His smokeless tobacco use includes snuff. He reports that he does not drink alcohol and does not use drugs.  ROS: All other review of systems were reviewed and are negative except what is noted above in HPI  Physical Exam: BP 137/83   Pulse (!) 102   Constitutional:  Alert and oriented, No acute distress. HEENT: [place] AT, moist mucus membranes.  Trachea midline, no masses. Cardiovascular: No clubbing, cyanosis, or edema. Respiratory: Normal respiratory effort, no increased work of breathing. GI: Abdomen is soft, nontender, nondistended, no abdominal masses GU: No CVA tenderness.  Lymph: No cervical or inguinal lymphadenopathy. Skin: No rashes, bruises or suspicious lesions. Neurologic: Grossly intact, no focal deficits, moving all 4 extremities. Psychiatric: Normal mood and affect.  Laboratory Data: Lab Results  Component Value Date   WBC 15.3 (H) 07/05/2014   HGB 15.0 07/05/2014   HCT 43.4 07/05/2014   MCV 86.6 07/05/2014   PLT 305 07/05/2014    Lab Results  Component Value Date   CREATININE 0.84 01/16/2022    No results found for: "PSA"  No results found for: "TESTOSTERONE"  Lab Results  Component Value Date   HGBA1C 9.5 (H) 01/16/2022    Urinalysis    Component Value Date/Time   COLORURINE YELLOW 05/15/2021 1809   APPEARANCEUR Clear 01/14/2022 1130   LABSPEC 1.018 05/15/2021 1809   PHURINE 5.0  05/15/2021 1809   GLUCOSEU 3+ (A) 01/14/2022 1130   HGBUR LARGE (A) 05/15/2021 1809   BILIRUBINUR Negative 01/14/2022 1130   KETONESUR 5 (A) 05/15/2021 1809   PROTEINUR 2+ (A) 01/14/2022 1130   PROTEINUR 30 (A) 05/15/2021 1809   UROBILINOGEN 0.2 11/24/2013 1715   NITRITE Negative 01/14/2022 1130   NITRITE NEGATIVE 05/15/2021 1809   LEUKOCYTESUR Negative 01/14/2022 1130   LEUKOCYTESUR NEGATIVE 05/15/2021 1809    Lab Results  Component Value Date   LABMICR See below: 01/14/2022   WBCUA None seen 01/14/2022   LABEPIT 0-10 01/14/2022   MUCUS Present (A) 12/31/2021   BACTERIA None seen 01/14/2022    Pertinent Imaging: CT 11/09/2021: Images reviewed and discussed with the patient  Results for orders placed during the hospital encounter of 12/31/21  Abdomen 1 view (KUB)  Narrative CLINICAL DATA:  Left flank pain.  Nephrolithiasis.  EXAM: ABDOMEN - 1 VIEW  COMPARISON:  Abdominopelvic CT 11/09/2021.  FINDINGS: Two supine views of the abdomen are submitted. Partial staghorn left renal calculus demonstrated by CT is not well seen radiographically. No new calcifications are seen along the expected course of the ureters. The visualized bowel gas pattern is normal. The bones appear unremarkable.  IMPRESSION: Known partial staghorn left renal calculus is not well seen radiographically.   Electronically Signed By: Carey Bullocks M.D. On: 12/31/2021 12:40  No results found for this or any previous visit.  No results found for this or any previous visit.  No results found for this or any previous visit.  No results found for this or any previous visit.  No valid procedures specified. No results found for this or any previous visit.  Results for orders placed in visit on 11/02/21  CT RENAL STONE STUDY  Narrative CLINICAL DATA:  Left flank pain nephrolithiasis.  Hematuria.  EXAM: CT ABDOMEN AND PELVIS WITHOUT CONTRAST  TECHNIQUE: Multidetector CT imaging of the  abdomen and pelvis was performed following the standard protocol without IV contrast.  RADIATION DOSE REDUCTION: This exam was performed according to the departmental dose-optimization program which includes automated exposure control, adjustment of the mA and/or kV according to patient size and/or use of iterative reconstruction technique.  COMPARISON:  Multiple priors including most recent CT May 15, 2021  FINDINGS: Lower chest: No acute abnormality.  Hepatobiliary: Similar marked diffuse hepatic steatosis with focal fatty sparing along the gallbladder fossa. Gallbladder is distended without signs of acute inflammation. No biliary ductal dilation.  Pancreas: No pancreatic ductal dilation or evidence of acute inflammation.  Spleen: No splenomegaly.  Adrenals/Urinary Tract: Bilateral adrenal glands appear normal.  No hydronephrosis. 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 Stranding along the left lower pole renal calices and the renal pelvis. No obstructive ureteral or bladder calculi identified. Urinary bladder is unremarkable for degree of distension.  Stomach/Bowel: No radiopaque enteric contrast material was administered. Stomach is  unremarkable for degree of distension. No pathologic dilation of small or large bowel. No evidence of acute bowel inflammation.  Vascular/Lymphatic: Normal caliber abdominal aorta. Similar prominent portacaval and periportal lymph nodes measuring up to 12 mm in short axis on image 37/2 favored reactive in the setting of hepatic steatosis.  Reproductive: Prostate is unremarkable.  Other: No significant abdominopelvic free fluid.  Musculoskeletal: No acute or significant osseous findings.  IMPRESSION: 1. Large left lower pole staghorn type renal calculus measuring 3 cm with stranding along the left lower pole renal sinus and renal pelvis likely reactive secondary to the large calculus, suggest correlation  with laboratory values to exclude superimposed infection. 2. No obstructive uropathy. 3. Similar marked diffuse hepatic steatosis with focal fatty sparing along the gallbladder fossa.   Electronically Signed By: Maudry Mayhew M.D. On: 11/09/2021 16:00   Assessment & Plan:    1. Nephrolithiasis -We discussed the management of kidney stones. These options include observation, ureteroscopy, shockwave lithotripsy (ESWL) and percutaneous nephrolithotomy (PCNL). We discussed which options are relevant to the patient's stone(s). We discussed the natural history of kidney stones as well as the complications of untreated stones and the impact on quality of life without treatment as well as with each of the above listed treatments. We also discussed the efficacy of each treatment in its ability to clear the stone burden. With any of these management options I discussed the signs and symptoms of infection and the need for emergent treatment should these be experienced. For each option we discussed the ability of each procedure to clear the patient of their stone burden.   For observation I described the risks which include but are not limited to silent renal damage, life-threatening infection, need for emergent surgery, failure to pass stone and pain.   For ureteroscopy I described the risks which include bleeding, infection, damage to contiguous structures, positioning injury, ureteral stricture, ureteral avulsion, ureteral injury, need for prolonged ureteral stent, inability to perform ureteroscopy, need for an interval procedure, inability to clear stone burden, stent discomfort/pain, heart attack, stroke, pulmonary embolus and the inherent risks with general anesthesia.   For shockwave lithotripsy I described the risks which include arrhythmia, kidney contusion, kidney hemorrhage, need for transfusion, pain, inability to adequately break up stone, inability to pass stone fragments, [place]str[place],  infection associated with obstructing stones, need for alternate surgical procedure, need for repeat shockwave lithotripsy, MI, CVA, PE and the inherent risks with anesthesia/conscious sedation.   For PCNL I described the risks including positioning injury, pneumothorax, hydrothorax, need for chest tube, inability to clear stone burden, renal laceration, arterial venous fistula or malformation, need for embolization of kidney, loss of kidney or renal function, need for repeat procedure, need for prolonged nephrostomy tube, ureteral avulsion, MI, CVA, PE and the inherent risks of general anesthesia.   - The patient would like to proceed with left percutaneous nephrostolithotomy  - Urinalysis, Routine w reflex microscopic   No follow-ups on file.  Wilkie Aye, MD  [facility] [place]

## 2022-02-04 ENCOUNTER — Telehealth: Payer: Self-pay

## 2022-02-04 NOTE — Telephone Encounter (Signed)
Patient left a voice message 02-04-2022.  Requesting a refill of pain medication.  Call back:  [phone]  Thanks, Rosey Bath

## 2022-02-05 ENCOUNTER — Other Ambulatory Visit: Payer: Self-pay | Admitting: Urology

## 2022-02-05 DIAGNOSIS — N200 Calculus of kidney: Secondary | ICD-10-CM

## 2022-02-05 DIAGNOSIS — R109 Unspecified abdominal pain: Secondary | ICD-10-CM

## 2022-02-05 MED ORDER — OXYCODONE HCL 5 MG PO TABS: 5.0000 mg | ORAL_TABLET | Freq: Four times a day (QID) | ORAL | 0 refills | Status: DC | PRN

## 2022-02-06 NOTE — Telephone Encounter (Signed)
Made patient aware that a RX for pain medication has been sent to his pharmacy. Patient voiced understanding.

## 2022-02-07 ENCOUNTER — Telehealth: Payer: Self-pay

## 2022-02-07 DIAGNOSIS — N200 Calculus of kidney: Secondary | ICD-10-CM

## 2022-02-07 NOTE — Telephone Encounter (Signed)
I called patient to offer appt for surgery- no answer. Left message to return call to office to discuss surgery dates.

## 2022-02-08 NOTE — Telephone Encounter (Signed)
I spoke with Chad Weaver . We have discussed possible surgery dates and 03/07/2022 was agreed upon by all parties. Patient given information about surgery date, what to expect pre-operatively and post operatively.    We discussed that a pre-op nurse will be calling to set up the pre-op visit that will take place prior to surgery. Informed patient that our office will communicate any additional care to be provided after surgery.    Patients questions or concerns were discussed during our call. Advised to call our office should there be any additional information, questions or concerns that arise. Patient verbalized understanding.    Patient understands he will have nephrostomy tube placed prior to surgery and will stay over night in hospital after procedure.

## 2022-02-08 NOTE — Addendum Note (Signed)
Addended by: Ferdinand Lango on: 02/08/2022 09:25 AM   Modules accepted: Orders

## 2022-02-14 ENCOUNTER — Telehealth: Payer: Self-pay

## 2022-02-14 DIAGNOSIS — R109 Unspecified abdominal pain: Secondary | ICD-10-CM

## 2022-02-14 DIAGNOSIS — N200 Calculus of kidney: Secondary | ICD-10-CM

## 2022-02-14 NOTE — Telephone Encounter (Signed)
Patient left a voice messaged 02-14-22.  Requested refill on pain medication.  Thanks, Rosey Bath

## 2022-02-14 NOTE — Telephone Encounter (Signed)
Please see below.

## 2022-02-15 MED ORDER — OXYCODONE HCL 5 MG PO TABS: 5.0000 mg | ORAL_TABLET | Freq: Four times a day (QID) | ORAL | 0 refills | Status: DC | PRN

## 2022-02-25 ENCOUNTER — Telehealth: Payer: Self-pay

## 2022-02-25 NOTE — Telephone Encounter (Signed)
Patient called and advised they needed a refill on medication below.   Medication: oxyCODONE (OXY IR/ROXICODONE) 5 MG immediate release tablet    Pharmacy: [facility] [address], [place]   Thank you

## 2022-02-27 NOTE — Telephone Encounter (Signed)
Patient calling back to check on pain medication refill.  Please advise.  Thanks, Helene Kelp

## 2022-02-27 NOTE — Telephone Encounter (Signed)
Confirmed patient had already received refill from 12/29.  Patient is requesting a refill of pain meds.  Please refill if appropriate.

## 2022-02-28 NOTE — Patient Instructions (Signed)
Your procedure is scheduled on: 03/07/2022  Report to [facility] at   10:00  AM.  Call this number if you have problems the morning of surgery: [phone]   Remember:   Do not Eat or Drink after midnight         No Smoking the morning of surgery  :  Take these medicines the morning of surgery with A SIP OF WATER: oxycodone and /or pyridium if needed   Do not wear jewelry, make-up or nail polish.  Do not wear lotions, powders, or perfumes. You may wear deodorant.  Do not shave 48 hours prior to surgery. Men may shave face and neck.  Do not bring valuables to the hospital.  Contacts, dentures or bridgework may not be worn into surgery.  Leave suitcase in the car. After surgery it may be brought to your room.  For patients admitted to the hospital, checkout time is 11:00 AM the day of discharge.   Patients discharged the day of surgery will not be allowed to drive home.    Special Instructions: Shower using CHG night before surgery and shower the day of surgery use CHG.  Use special wash - you have one bottle of CHG for all showers.  You should use approximately 1/2 of the bottle for each shower. How to Use Chlorhexidine Before Surgery Chlorhexidine gluconate (CHG) is a germ-killing (antiseptic) solution that is used to clean the skin. It can get rid of the bacteria that normally live on the skin and can keep them away for about 24 hours. To clean your skin with CHG, you may be given: A CHG solution to use in the shower or as part of a sponge bath. A prepackaged cloth that contains CHG. Cleaning your skin with CHG may help lower the risk for infection: While you are staying in the intensive care unit of the hospital. If you have a vascular access, such as a central line, to provide short-term or long-term access to your veins. If you have a catheter to drain urine from your bladder. If you are on a ventilator. A ventilator is a machine that helps you breathe by moving  air in and out of your lungs. After surgery. What are the risks? Risks of using CHG include: A skin reaction. Hearing loss, if CHG gets in your ears and you have a perforated eardrum. Eye injury, if CHG gets in your eyes and is not rinsed out. The CHG product catching fire. Make sure that you avoid smoking and flames after applying CHG to your skin. Do not use CHG: If you have a chlorhexidine allergy or have previously reacted to chlorhexidine. On babies younger than 20 months of age. How to use CHG solution Use CHG only as told by your health care provider, and follow the instructions on the label. Use the full amount of CHG as directed. Usually, this is one bottle. During a shower Follow these steps when using CHG solution during a shower (unless your health care provider gives you different instructions): Start the shower. Use your normal soap and shampoo to wash your face and hair. Turn off the shower or move out of the shower stream. Pour the CHG onto a clean washcloth. Do not use any type of brush or rough-edged sponge. Starting at your neck, lather your body down to your toes. Make sure you follow these instructions: If you will be having surgery, pay special attention to the part of your body where you will  be having surgery. Scrub this area for at least 1 minute. Do not use CHG on your head or face. If the solution gets into your ears or eyes, rinse them well with water. Avoid your genital area. Avoid any areas of skin that have broken skin, cuts, or scrapes. Scrub your back and under your arms. Make sure to wash skin folds. Let the lather sit on your skin for 1-2 minutes or as long as told by your health care provider. Thoroughly rinse your entire body in the shower. Make sure that all body creases and crevices are rinsed well. Dry off with a clean towel. Do not put any substances on your body afterward--such as powder, lotion, or perfume--unless you are told to do so by your  health care provider. Only use lotions that are recommended by the manufacturer. Put on clean clothes or pajamas. If it is the night before your surgery, sleep in clean sheets.  During a sponge bath Follow these steps when using CHG solution during a sponge bath (unless your health care provider gives you different instructions): Use your normal soap and shampoo to wash your face and hair. Pour the CHG onto a clean washcloth. Starting at your neck, lather your body down to your toes. Make sure you follow these instructions: If you will be having surgery, pay special attention to the part of your body where you will be having surgery. Scrub this area for at least 1 minute. Do not use CHG on your head or face. If the solution gets into your ears or eyes, rinse them well with water. Avoid your genital area. Avoid any areas of skin that have broken skin, cuts, or scrapes. Scrub your back and under your arms. Make sure to wash skin folds. Let the lather sit on your skin for 1-2 minutes or as long as told by your health care provider. Using a different clean, wet washcloth, thoroughly rinse your entire body. Make sure that all body creases and crevices are rinsed well. Dry off with a clean towel. Do not put any substances on your body afterward--such as powder, lotion, or perfume--unless you are told to do so by your health care provider. Only use lotions that are recommended by the manufacturer. Put on clean clothes or pajamas. If it is the night before your surgery, sleep in clean sheets. How to use CHG prepackaged cloths Only use CHG cloths as told by your health care provider, and follow the instructions on the label. Use the CHG cloth on clean, dry skin. Do not use the CHG cloth on your head or face unless your health care provider tells you to. When washing with the CHG cloth: Avoid your genital area. Avoid any areas of skin that have broken skin, cuts, or scrapes. Before surgery Follow  these steps when using a CHG cloth to clean before surgery (unless your health care provider gives you different instructions): Using the CHG cloth, vigorously scrub the part of your body where you will be having surgery. Scrub using a back-and-forth motion for 3 minutes. The area on your body should be completely wet with CHG when you are done scrubbing. Do not rinse. Discard the cloth and let the area air-dry. Do not put any substances on the area afterward, such as powder, lotion, or perfume. Put on clean clothes or pajamas. If it is the night before your surgery, sleep in clean sheets.  For general bathing Follow these steps when using CHG cloths for general bathing (unless  your health care provider gives you different instructions). Use a separate CHG cloth for each area of your body. Make sure you wash between any folds of skin and between your fingers and toes. Wash your body in the following order, switching to a new cloth after each step: The front of your neck, shoulders, and chest. Both of your arms, under your arms, and your hands. Your stomach and groin area, avoiding the genitals. Your right leg and foot. Your left leg and foot. The back of your neck, your back, and your buttocks. Do not rinse. Discard the cloth and let the area air-dry. Do not put any substances on your body afterward--such as powder, lotion, or perfume--unless you are told to do so by your health care provider. Only use lotions that are recommended by the manufacturer. Put on clean clothes or pajamas. Contact a health care provider if: Your skin gets irritated after scrubbing. You have questions about using your solution or cloth. You swallow any chlorhexidine. Call your local poison control center [phone] in the [place] Get help right away if: Your eyes itch badly, or they become very red or swollen. Your skin itches badly and is red or swollen. Your hearing changes. You have trouble seeing. You have  swelling or tingling in your mouth or throat. You have trouble breathing. These symptoms may represent a serious problem that is an emergency. Do not wait to see if the symptoms will go away. Get medical help right away. Call your local emergency services (911 in the [place] Do not drive yourself to the hospital. Summary Chlorhexidine gluconate (CHG) is a germ-killing (antiseptic) solution that is used to clean the skin. Cleaning your skin with CHG may help to lower your risk for infection. You may be given CHG to use for bathing. It may be in a bottle or in a prepackaged cloth to use on your skin. Carefully follow your health care provider's instructions and the instructions on the product label. Do not use CHG if you have a chlorhexidine allergy. Contact your health care provider if your skin gets irritated after scrubbing. This information is not intended to replace advice given to you by your health care provider. Make sure you discuss any questions you have with your health care provider. Document Revised: 06/04/2021 Document Reviewed: 04/17/2020 Elsevier Patient Education  2023 Else[employer]. Percutaneous Nephrolithotomy, Care After This sheet gives you information about how to care for yourself after your procedure. Your health care provider may also give you more specific instructions. If you have problems or questions, contact your health care provider. What can I expect after the procedure? After the procedure, it is common to have: Soreness or pain. A small amount of blood or clear fluid coming from your incision for a few days. Fatigue. Some blood in your urine. This will last for a few days. Follow these instructions at home: Medicines Take over-the-counter and prescription medicines only as told by your health care provider. If you were prescribed an antibiotic medicine, take it as told by your health care provider. Do not stop using the antibiotic even if you start to feel  better. Ask your health care provider if the medicine prescribed to you: Requires you to avoid driving or using heavy machinery. Can cause constipation. You may need to take these actions to prevent or treat constipation: Drink enough fluid to keep your urine pale yellow. Take over-the-counter or prescription medicines. Eat foods that are high in fiber, such as beans, whole grains,  and fresh fruits and vegetables. Limit foods that are high in fat and processed sugars, such as fried or sweet foods. Incision care  Follow instructions from your health care provider about how to take care of your incision. Make sure you: Wash your hands with soap and water before and after you change your bandage (dressing). If soap and water are not available, use hand sanitizer. Change your dressing as told by your health care provider. Leave stitches (sutures), skin glue, or adhesive strips in place. These skin closures may need to stay in place for 2 weeks or longer. If adhesive strip edges start to loosen and curl up, you may trim the loose edges. Do not remove adhesive strips completely unless your health care provider tells you to do that. Check your incision area every day for signs of infection. Check for: Redness, swelling, or pain. More fluid or blood. Warmth. Pus or a bad smell. Do not take baths, swim, or use a hot tub until your health care provider approves. Ask your health care provider if you may take showers. You may only be allowed to take sponge baths. Activity Avoid strenuous activities for as long as told by your health care provider. Return to your normal activities as told by your health care provider. Ask your health care provider what activities are safe for you. General instructions If you were sent home with a catheter or surgical drain (nephrostomy tube), follow your health care provider's instructions on how to take care of it. Wear compression stockings as told by your health care  provider. These stockings help to prevent blood clots and reduce swelling in your legs. Do not use any products that contain nicotine or tobacco, such as cigarettes, e-cigarettes, and chewing tobacco. These can delay incision healing after surgery. If you need help quitting, ask your health care provider. Keep all follow-up visits as told by your health care provider. This is important. If you still have a stent, your health care provider will need to remove it after 1-2 weeks. Contact a health care provider if: You have a fever. You have redness, swelling, or pain around your incision. You have more fluid or blood coming from your incision. Your incision feels warm to the touch. You have pus or a bad smell coming from your incision. You lose your appetite. You feel nauseous or you vomit. You have been sent home with a urinary catheter or a surgical drain and urine flow suddenly stops, followed by kidney pain. Get help right away if: You notice a large amount of blood in your urine. You have blood clots in your urine. You cannot urinate. You have chest pain or trouble breathing. Summary After the procedure, it is common to feel soreness and discomfort. You may also see blood in your urine. You will be told how to care for yourself after the procedure. Follow instructions on how to care for your incision and how to recognize signs of infection. Avoid activities that require great effort. Return to activities as told by your health care provider. Get help right away if you have blood clots in your urine, you cannot urinate, or you have trouble breathing. This information is not intended to replace advice given to you by your health care provider. Make sure you discuss any questions you have with your health care provider. Document Revised: 10/08/2020 Document Reviewed: 10/09/2020 Elsevier Patient Education  2023 Elsevier Inc. General Anesthesia, Adult, Care After The following information  offers guidance on how to  care for yourself after your procedure. Your health care provider may also give you more specific instructions. If you have problems or questions, contact your health care provider. What can I expect after the procedure? After the procedure, it is common for people to: Have pain or discomfort at the IV site. Have nausea or vomiting. Have a sore throat or hoarseness. Have trouble concentrating. Feel cold or chills. Feel weak, sleepy, or tired (fatigue). Have soreness and body aches. These can affect parts of the body that were not involved in surgery. Follow these instructions at home: For the time period you were told by your health care provider:  Rest. Do not participate in activities where you could fall or become injured. Do not drive or use machinery. Do not drink alcohol. Do not take sleeping pills or medicines that cause drowsiness. Do not make important decisions or sign legal documents. Do not take care of children on your own. General instructions Drink enough fluid to keep your urine pale yellow. If you have sleep apnea, surgery and certain medicines can increase your risk for breathing problems. Follow instructions from your health care provider about wearing your sleep device: Anytime you are sleeping, including during daytime naps. While taking prescription pain medicines, sleeping medicines, or medicines that make you drowsy. Return to your normal activities as told by your health care provider. Ask your health care provider what activities are safe for you. Take over-the-counter and prescription medicines only as told by your health care provider. Do not use any products that contain nicotine or tobacco. These products include cigarettes, chewing tobacco, and vaping devices, such as e-cigarettes. These can delay incision healing after surgery. If you need help quitting, ask your health care provider. Contact a health care provider if: You have  nausea or vomiting that does not get better with medicine. You vomit every time you eat or drink. You have pain that does not get better with medicine. You cannot urinate or have bloody urine. You develop a skin rash. You have a fever. Get help right away if: You have trouble breathing. You have chest pain. You vomit blood. These symptoms may be an emergency. Get help right away. Call 911. Do not wait to see if the symptoms will go away. Do not drive yourself to the hospital. Summary After the procedure, it is common to have a sore throat, hoarseness, nausea, vomiting, or to feel weak, sleepy, or fatigue. For the time period you were told by your health care provider, do not drive or use machinery. Get help right away if you have difficulty breathing, have chest pain, or vomit blood. These symptoms may be an emergency. This information is not intended to replace advice given to you by your health care provider. Make sure you discuss any questions you have with your health care provider. Document Revised: 05/04/2021 Document Reviewed: 05/04/2021 Elsevier Patient Education  [place].

## 2022-03-05 ENCOUNTER — Encounter (HOSPITAL_COMMUNITY)
Admission: RE | Admit: 2022-03-05 | Discharge: 2022-03-05 | Disposition: A | Discharge status: Home / Self Care | Payer: Self-pay | Source: Ambulatory Visit | Attending: Urology | Admitting: Urology

## 2022-03-05 ENCOUNTER — Other Ambulatory Visit: Payer: Self-pay | Admitting: Internal Medicine

## 2022-03-05 ENCOUNTER — Other Ambulatory Visit: Payer: Self-pay | Admitting: Radiology

## 2022-03-05 DIAGNOSIS — N200 Calculus of kidney: Secondary | ICD-10-CM | POA: Insufficient documentation

## 2022-03-05 DIAGNOSIS — Z01812 Encounter for preprocedural laboratory examination: Secondary | ICD-10-CM | POA: Insufficient documentation

## 2022-03-05 LAB — BASIC METABOLIC PANEL
Anion gap: 13 (ref 5–15)
BUN: 14 mg/dL (ref 6–20)
CO2: 25 mmol/L (ref 22–32)
Calcium: 8.9 mg/dL (ref 8.9–10.3)
Chloride: 99 mmol/L (ref 98–111)
Creatinine, Ser: 0.75 mg/dL (ref 0.61–1.24)
GFR, Estimated: >60 mL/min (ref >60)
Glucose, Bld: 249 mg/dL — ABNORMAL HIGH (ref 70–99)
Potassium: 3.7 mmol/L (ref 3.5–5.1)
Sodium: 137 mmol/L (ref 135–145)

## 2022-03-05 NOTE — Consult Note (Signed)
Chief Complaint: Patient was seen in consultation today for left percutaneous nephrostomy/nephroureteral catheter placement   Referring Physician(s): [place] Physician: Owens Shark  Patient Status: [facility] - Out-pt  History of Present Illness: Chad Weaver is a 32 y.o. male  with PMH GERD, gout, and nephrolithiasis. On 11/02/21 he underwent CT for left flank pain/hematuria which revealed:  1. 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 2. No obstructive uropathy. 3. Similar marked diffuse hepatic steatosis with focal fatty sparing along the gallbladder fossa.  He is s/p left ureteral stent placement by urology on 01/17/22. He presents today for left PCN/NU cath prior to planned PCNL at [facility] on 03/07/22.    Past Medical History:  Diagnosis Date   GERD (gastroesophageal reflux disease)    Gout    H/O knee surgery    History of kidney stones    Kidney stones     Past Surgical History:  Procedure Laterality Date   ADENOIDECTOMY     ANTERIOR CRUCIATE LIGAMENT REPAIR     ANTERIOR CRUCIATE LIGAMENT REPAIR Left    CYSTOSCOPY W/ URETERAL STENT PLACEMENT Left 01/17/2022   Procedure: CYSTOSCOPY WITH RETROGRADE PYELOGRAM/URETERAL STENT PLACEMENT;  Surgeon: Primus Bravo., MD;  Location: [facility];  Service: Urology;  Laterality: Left;   CYSTOSCOPY WITH RETROGRADE PYELOGRAM, URETEROSCOPY AND STENT PLACEMENT Bilateral 01/17/2022   Procedure: CYSTOSCOPY WITH BILATERAL RETROGRADE PYELOGRAM;  Surgeon: Primus Bravo., MD;  Location: [facility];  Service: Urology;  Laterality: Bilateral;   TYMPANOSTOMY TUBE PLACEMENT      Allergies: Sulfonamide derivatives  Medications: Prior to Admission medications   Medication Sig Start Date End Date  Taking? Authorizing Provider  allopurinol (ZYLOPRIM) 100 MG tablet Take 1 tablet (100 mg total) by mouth daily. 12/21/21   Stoneking, Reece Leader., MD  oxyCODONE (OXY IR/ROXICODONE) 5 MG immediate release tablet Take 1 tablet (5 mg total) by mouth every 6 (six) hours as needed for severe pain. 02/15/22   McKenzie, Candee Furbish, MD  phenazopyridine (PYRIDIUM) 200 MG tablet Take 1 tablet (200 mg total) by mouth 3 (three) times daily as needed for pain. 01/17/22 01/17/23  Stoneking, Reece Leader., MD  Potassium Citrate 15 MEQ (1620 MG) TBCR Take 2 tablets by mouth 2 (two) times daily. 01/17/22   Stoneking, Reece Leader., MD  sodium bicarbonate 650 MG tablet Take 1 tablet (650 mg total) by mouth 2 (two) times daily. 12/31/21   Stoneking, Reece Leader., MD     No family history on file.  Social History   Socioeconomic History   Marital status: Married    Spouse name: Not on file   Number of children: Not on file   Years of education: Not on file   Highest education level: Not on file  Occupational History   Not on file  Tobacco Use   Smoking status: Some Days    Packs/day: 0.50    Types: Cigarettes   Smokeless tobacco: Current    Types: Snuff  Vaping Use   Vaping Use: Never used  Substance and Sexual Activity   Alcohol use: No   Drug use: No   Sexual activity: Not on file  Other Topics Concern   Not on file  Social History Narrative   Not on file   Social Determinants of Health   Financial Resource  Strain: Not on file  Food Insecurity: Not on file  Transportation Needs: Not on file  Physical Activity: Not on file  Stress: Not on file  Social Connections: Not on file      Review of Systems denies fever, HA,CP,dyspnea, cough, abd pain, N/V or visible bleeding; he does have left flank discomfort and dysuria. He is anxious  Vital Signs: Vitals:   03/06/22 0719 03/06/22 0722  BP: (!) 177/97 (!) 142/90  Pulse: (!) 108 (!) 101  Resp: 18   Temp: 98.4 F (36.9 C)   SpO2: 96%          Physical Exam awake/alert; chest- CTA bilat; heart- tachy but reg rhythm; abd- obese, soft,+BS,NT; ext with FROM  Imaging: No results found.  Labs:  CBC: No results for input(s): "WBC", "HGB", "HCT", "PLT" in the last 8760 hours.  COAGS: No results for input(s): "INR", "APTT" in the last 8760 hours.  BMP: Recent Labs    12/20/21 1148 01/16/22 1420 03/05/22 1515  NA 140 134* 137  K 4.6 4.2 3.7  CL 100 99 99  CO2 23 24 25   GLUCOSE 318* 241* 249*  BUN 10 15 14   CALCIUM 9.5 8.6* 8.9  CREATININE 0.76 0.84 0.75  GFRNONAA  --  >60 >60    LIVER FUNCTION TESTS: No results for input(s): "BILITOT", "AST", "ALT", "ALKPHOS", "PROT", "ALBUMIN" in the last 8760 hours.  TUMOR MARKERS: No results for input(s): "AFPTM", "CEA", "CA199", "CHROMGRNA" in the last 8760 hours.  Assessment and Plan: 32 y.o. male smoker with PMH GERD, gout, and nephrolithiasis. On 11/02/21 he underwent CT for left flank pain/hematuria which revealed:  1. 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 2. No obstructive uropathy. 3. Similar marked diffuse hepatic steatosis with focal fatty sparing along the gallbladder fossa.  He is s/p left ureteral stent placement by urology on 01/17/22. He presents today for left PCN/NU cath prior to planned PCNL at [facility] on 03/07/22. Risks and benefits of left PCN placement was discussed with the patient including, but not limited to, infection, bleeding, significant bleeding causing loss or decrease in renal function or damage to adjacent structures.   All of the patient's questions were answered, patient is agreeable to proceed.  Consent signed and in chart. Prior to case will obtain updated renal CT    LABS PENDING   Thank you for this  interesting consult.  I greatly enjoyed meeting Chad Weaver and look forward to participating in their care.  A copy of this report was sent to the requesting provider on this date.  Electronically Signed: D. Rowe Robert, PA-C 03/05/2022, 5:03 PM   I spent a total of 25 minutes    in face to face in clinical consultation, greater than 50% of which was counseling/coordinating care for left percutaneous nephrostomy/nephroureteral catheter placement

## 2022-03-06 ENCOUNTER — Ambulatory Visit (HOSPITAL_COMMUNITY)
Admission: RE | Admit: 2022-03-06 | Discharge: 2022-03-06 | Disposition: A | Discharge status: Home / Self Care | Payer: Self-pay | Source: Ambulatory Visit | Attending: Urology | Admitting: Urology

## 2022-03-06 ENCOUNTER — Other Ambulatory Visit (HOSPITAL_COMMUNITY): Payer: Self-pay | Admitting: Interventional Radiology

## 2022-03-06 ENCOUNTER — Encounter (HOSPITAL_COMMUNITY): Payer: Self-pay

## 2022-03-06 ENCOUNTER — Other Ambulatory Visit: Payer: Self-pay

## 2022-03-06 ENCOUNTER — Ambulatory Visit (HOSPITAL_COMMUNITY)
Admission: RE | Admit: 2022-03-06 | Discharge: 2022-03-06 | Disposition: A | Discharge status: Home / Self Care | Payer: Self-pay | Source: Ambulatory Visit | Attending: Interventional Radiology | Admitting: Interventional Radiology

## 2022-03-06 ENCOUNTER — Other Ambulatory Visit: Payer: Self-pay | Admitting: Urology

## 2022-03-06 DIAGNOSIS — N200 Calculus of kidney: Secondary | ICD-10-CM

## 2022-03-06 DIAGNOSIS — K760 Fatty (change of) liver, not elsewhere classified: Secondary | ICD-10-CM | POA: Insufficient documentation

## 2022-03-06 DIAGNOSIS — Z539 Procedure and treatment not carried out, unspecified reason: Secondary | ICD-10-CM | POA: Insufficient documentation

## 2022-03-06 DIAGNOSIS — R109 Unspecified abdominal pain: Secondary | ICD-10-CM

## 2022-03-06 LAB — PROTIME-INR
INR: 0.9 (ref 0.8–1.2)
Prothrombin Time: 12.2 seconds (ref 11.4–15.2)

## 2022-03-06 LAB — BASIC METABOLIC PANEL
Anion gap: 11 (ref 5–15)
BUN: 11 mg/dL (ref 6–20)
CO2: 24 mmol/L (ref 22–32)
Calcium: 8.7 mg/dL — ABNORMAL LOW (ref 8.9–10.3)
Chloride: 99 mmol/L (ref 98–111)
Creatinine, Ser: 0.7 mg/dL (ref 0.61–1.24)
GFR, Estimated: >60 mL/min (ref >60)
Glucose, Bld: 206 mg/dL — ABNORMAL HIGH (ref 70–99)
Potassium: 4.9 mmol/L (ref 3.5–5.1)
Sodium: 134 mmol/L — ABNORMAL LOW (ref 135–145)

## 2022-03-06 MED ORDER — MIDAZOLAM HCL 2 MG/2ML IJ SOLN
INTRAMUSCULAR | Status: AC
  Filled 2022-03-06: qty 2

## 2022-03-06 MED ORDER — FENTANYL CITRATE (PF) 100 MCG/2ML IJ SOLN
INTRAMUSCULAR | Status: AC
  Filled 2022-03-06: qty 2

## 2022-03-06 MED ORDER — OXYCODONE HCL 5 MG PO TABS: 5.0000 mg | ORAL_TABLET | Freq: Four times a day (QID) | ORAL | 0 refills | Status: DC | PRN

## 2022-03-06 MED ORDER — LIDOCAINE HCL 1 % IJ SOLN
INTRAMUSCULAR | Status: AC
  Filled 2022-03-06: qty 20

## 2022-03-06 MED ORDER — SODIUM CHLORIDE 0.9 % IV SOLN
2.0000 g | INTRAVENOUS | Status: DC
  Filled 2022-03-06: qty 20

## 2022-03-06 MED ORDER — SODIUM CHLORIDE 0.9 % IV SOLN: INTRAVENOUS | Status: DC

## 2022-03-07 ENCOUNTER — Ambulatory Visit (HOSPITAL_COMMUNITY)
Admission: RE | Admit: 2022-03-07 | Discharge: 2022-03-07 | Disposition: A | Discharge status: Home / Self Care | Payer: Self-pay | Attending: Urology | Admitting: Urology

## 2022-03-07 ENCOUNTER — Ambulatory Visit (HOSPITAL_COMMUNITY): Payer: Self-pay | Admitting: Certified Registered Nurse Anesthetist

## 2022-03-07 ENCOUNTER — Encounter (HOSPITAL_COMMUNITY): Payer: Self-pay | Admitting: Urology

## 2022-03-07 ENCOUNTER — Ambulatory Visit (HOSPITAL_COMMUNITY): Payer: Self-pay

## 2022-03-07 ENCOUNTER — Encounter (HOSPITAL_COMMUNITY)
Admission: RE | Disposition: A | Discharge status: Home / Self Care | Payer: Self-pay | Source: Home / Self Care | Attending: Urology

## 2022-03-07 ENCOUNTER — Ambulatory Visit (HOSPITAL_BASED_OUTPATIENT_CLINIC_OR_DEPARTMENT_OTHER): Payer: Self-pay | Admitting: Certified Registered Nurse Anesthetist

## 2022-03-07 DIAGNOSIS — Z6841 Body Mass Index (BMI) 40.0 and over, adult: Secondary | ICD-10-CM | POA: Insufficient documentation

## 2022-03-07 DIAGNOSIS — K219 Gastro-esophageal reflux disease without esophagitis: Secondary | ICD-10-CM | POA: Insufficient documentation

## 2022-03-07 DIAGNOSIS — R109 Unspecified abdominal pain: Secondary | ICD-10-CM

## 2022-03-07 DIAGNOSIS — F17210 Nicotine dependence, cigarettes, uncomplicated: Secondary | ICD-10-CM | POA: Insufficient documentation

## 2022-03-07 DIAGNOSIS — N200 Calculus of kidney: Secondary | ICD-10-CM

## 2022-03-07 DIAGNOSIS — F419 Anxiety disorder, unspecified: Secondary | ICD-10-CM

## 2022-03-07 HISTORY — PX: HOLMIUM LASER APPLICATION: SHX5852

## 2022-03-07 HISTORY — PX: CYSTOSCOPY WITH RETROGRADE PYELOGRAM, URETEROSCOPY AND STENT PLACEMENT: SHX5789

## 2022-03-07 HISTORY — PX: STONE EXTRACTION WITH BASKET: SHX5318

## 2022-03-07 LAB — GLUCOSE, CAPILLARY: Glucose-Capillary: 226 mg/dL — ABNORMAL HIGH (ref 70–99)

## 2022-03-07 SURGERY — CYSTOURETEROSCOPY, WITH RETROGRADE PYELOGRAM AND STENT INSERTION
Anesthesia: General | Site: Ureter | Laterality: Left

## 2022-03-07 MED ORDER — ALFUZOSIN HCL ER 10 MG PO TB24: 10.0000 mg | ORAL_TABLET | Freq: Every day | ORAL | 1 refills | Status: DC

## 2022-03-07 MED ORDER — FENTANYL CITRATE PF 50 MCG/ML IJ SOSY
25.0000 ug | PREFILLED_SYRINGE | INTRAMUSCULAR | Status: DC | PRN
  Administered 2022-03-07: 50 ug via INTRAVENOUS
  Filled 2022-03-07: qty 1

## 2022-03-07 MED ORDER — ONDANSETRON HCL 4 MG PO TABS: 4.0000 mg | ORAL_TABLET | Freq: Every day | ORAL | 1 refills | Status: DC | PRN

## 2022-03-07 MED ORDER — ESMOLOL HCL 100 MG/10ML IV SOLN
INTRAVENOUS | Status: AC
  Filled 2022-03-07: qty 10

## 2022-03-07 MED ORDER — LACTATED RINGERS IV SOLN: INTRAVENOUS | Status: DC

## 2022-03-07 MED ORDER — DEXAMETHASONE SODIUM PHOSPHATE 10 MG/ML IJ SOLN
INTRAMUSCULAR | Status: AC
  Filled 2022-03-07: qty 1

## 2022-03-07 MED ORDER — SUGAMMADEX SODIUM 500 MG/5ML IV SOLN
INTRAVENOUS | Status: AC
  Filled 2022-03-07: qty 5

## 2022-03-07 MED ORDER — SUCCINYLCHOLINE CHLORIDE 20 MG/ML IJ SOLN
INTRAMUSCULAR | Status: DC | PRN
  Administered 2022-03-07: 180 mg via INTRAVENOUS

## 2022-03-07 MED ORDER — FENTANYL CITRATE PF 50 MCG/ML IJ SOSY
PREFILLED_SYRINGE | INTRAMUSCULAR | Status: AC
  Filled 2022-03-07: qty 1

## 2022-03-07 MED ORDER — PROPOFOL 10 MG/ML IV BOLUS
INTRAVENOUS | Status: AC
  Filled 2022-03-07: qty 20

## 2022-03-07 MED ORDER — FENTANYL CITRATE (PF) 250 MCG/5ML IJ SOLN
INTRAMUSCULAR | Status: AC
  Filled 2022-03-07: qty 5

## 2022-03-07 MED ORDER — HYDROCODONE-ACETAMINOPHEN 7.5-325 MG PO TABS: 1.0000 | ORAL_TABLET | Freq: Once | ORAL | Status: DC | PRN

## 2022-03-07 MED ORDER — OXYCODONE HCL 5 MG PO TABS: 5.0000 mg | ORAL_TABLET | Freq: Four times a day (QID) | ORAL | 0 refills | Status: DC | PRN

## 2022-03-07 MED ORDER — SODIUM CHLORIDE 0.9 % IR SOLN
Status: DC | PRN
  Administered 2022-03-07 (×2): 3000 mL

## 2022-03-07 MED ORDER — FENTANYL CITRATE PF 50 MCG/ML IJ SOSY
50.0000 ug | PREFILLED_SYRINGE | INTRAMUSCULAR | Status: DC | PRN
  Administered 2022-03-07: 50 ug via INTRAVENOUS

## 2022-03-07 MED ORDER — ONDANSETRON HCL 4 MG/2ML IJ SOLN
INTRAMUSCULAR | Status: DC | PRN
  Administered 2022-03-07: 4 mg via INTRAVENOUS

## 2022-03-07 MED ORDER — WATER FOR IRRIGATION, STERILE IR SOLN
Status: DC | PRN
  Administered 2022-03-07: 500 mL

## 2022-03-07 MED ORDER — LIDOCAINE HCL (PF) 2 % IJ SOLN
INTRAMUSCULAR | Status: AC
  Filled 2022-03-07: qty 5

## 2022-03-07 MED ORDER — DEXAMETHASONE SODIUM PHOSPHATE 10 MG/ML IJ SOLN
INTRAMUSCULAR | Status: DC | PRN
  Administered 2022-03-07: 10 mg via INTRAVENOUS

## 2022-03-07 MED ORDER — ROCURONIUM BROMIDE 10 MG/ML (PF) SYRINGE
PREFILLED_SYRINGE | INTRAVENOUS | Status: DC | PRN
  Administered 2022-03-07: 20 mg via INTRAVENOUS
  Administered 2022-03-07: 50 mg via INTRAVENOUS

## 2022-03-07 MED ORDER — ROCURONIUM BROMIDE 10 MG/ML (PF) SYRINGE
PREFILLED_SYRINGE | INTRAVENOUS | Status: AC
  Filled 2022-03-07: qty 10

## 2022-03-07 MED ORDER — DIATRIZOATE MEGLUMINE 30 % UR SOLN
URETHRAL | Status: DC | PRN
  Administered 2022-03-07: 6 mL via URETHRAL

## 2022-03-07 MED ORDER — CHLORHEXIDINE GLUCONATE 0.12 % MT SOLN
15.0000 mL | Freq: Once | OROMUCOSAL | Status: AC
  Administered 2022-03-07: 15 mL via OROMUCOSAL

## 2022-03-07 MED ORDER — SUGAMMADEX SODIUM 500 MG/5ML IV SOLN
INTRAVENOUS | Status: DC | PRN
  Administered 2022-03-07: 340.2 mg via INTRAVENOUS

## 2022-03-07 MED ORDER — ONDANSETRON HCL 4 MG/2ML IJ SOLN: 4.0000 mg | Freq: Once | INTRAMUSCULAR | Status: DC | PRN

## 2022-03-07 MED ORDER — SODIUM CHLORIDE 0.9 % IV SOLN
2.0000 g | INTRAVENOUS | Status: AC
  Administered 2022-03-07: 2 g via INTRAVENOUS
  Filled 2022-03-07: qty 20

## 2022-03-07 MED ORDER — ESMOLOL HCL 100 MG/10ML IV SOLN
INTRAVENOUS | Status: DC | PRN
  Administered 2022-03-07: 50 mg via INTRAVENOUS
  Administered 2022-03-07 (×2): 10 mg via INTRAVENOUS

## 2022-03-07 MED ORDER — SUCCINYLCHOLINE CHLORIDE 200 MG/10ML IV SOSY
PREFILLED_SYRINGE | INTRAVENOUS | Status: AC
  Filled 2022-03-07: qty 10

## 2022-03-07 MED ORDER — PROPOFOL 10 MG/ML IV BOLUS
INTRAVENOUS | Status: DC | PRN
  Administered 2022-03-07: 200 mg via INTRAVENOUS

## 2022-03-07 MED ORDER — ACETAMINOPHEN 10 MG/ML IV SOLN
INTRAVENOUS | Status: DC | PRN
  Administered 2022-03-07: 1000 mg via INTRAVENOUS

## 2022-03-07 MED ORDER — ORAL CARE MOUTH RINSE: 15.0000 mL | Freq: Once | OROMUCOSAL | Status: AC

## 2022-03-07 MED ORDER — ACETAMINOPHEN 10 MG/ML IV SOLN
INTRAVENOUS | Status: AC
  Filled 2022-03-07: qty 100

## 2022-03-07 MED ORDER — ONDANSETRON HCL 4 MG/2ML IJ SOLN
INTRAMUSCULAR | Status: AC
  Filled 2022-03-07: qty 2

## 2022-03-07 MED ORDER — FENTANYL CITRATE (PF) 100 MCG/2ML IJ SOLN
INTRAMUSCULAR | Status: DC | PRN
  Administered 2022-03-07: 25 ug via INTRAVENOUS
  Administered 2022-03-07: 100 ug via INTRAVENOUS
  Administered 2022-03-07: 50 ug via INTRAVENOUS
  Administered 2022-03-07: 25 ug via INTRAVENOUS

## 2022-03-07 MED ORDER — DIATRIZOATE MEGLUMINE 30 % UR SOLN
URETHRAL | Status: AC
  Filled 2022-03-07: qty 100

## 2022-03-07 MED ORDER — ONDANSETRON HCL 4 MG PO TABS: 4.0000 mg | ORAL_TABLET | Freq: Every day | ORAL | 1 refills | Status: AC | PRN

## 2022-03-07 MED ORDER — LIDOCAINE HCL (CARDIAC) PF 100 MG/5ML IV SOSY
PREFILLED_SYRINGE | INTRAVENOUS | Status: DC | PRN
  Administered 2022-03-07: 100 mg via INTRAVENOUS

## 2022-03-07 SURGICAL SUPPLY — 27 items
BAG DRAIN URO TABLE W/ADPT NS (BAG) ×3 IMPLANT
BAG DRN 8 ADPR NS SKTRN CSTL (BAG) ×2
BAG HAMPER (MISCELLANEOUS) ×3 IMPLANT
CATH INTERMIT  6FR 70CM (CATHETERS) ×3 IMPLANT
CLOTH BEACON ORANGE TIMEOUT ST (SAFETY) ×3 IMPLANT
EXTRACTOR STONE NITINOL NGAGE (UROLOGICAL SUPPLIES) IMPLANT
GLOVE BIO SURGEON STRL SZ7 (GLOVE) IMPLANT
GLOVE BIO SURGEON STRL SZ8 (GLOVE) ×3 IMPLANT
GLOVE BIOGEL PI IND STRL 7.0 (GLOVE) ×6 IMPLANT
GLOVE ECLIPSE 6.5 STRL STRAW (GLOVE) IMPLANT
GOWN STRL REUS W/TWL LRG LVL3 (GOWN DISPOSABLE) ×3 IMPLANT
GOWN STRL REUS W/TWL XL LVL3 (GOWN DISPOSABLE) ×3 IMPLANT
GUIDEWIRE STR DUAL SENSOR (WIRE) ×3 IMPLANT
GUIDEWIRE STR ZIPWIRE 035X150 (MISCELLANEOUS) ×3 IMPLANT
IV NS IRRIG 3000ML ARTHROMATIC (IV SOLUTION) ×6 IMPLANT
KIT TURNOVER CYSTO (KITS) ×3 IMPLANT
MANIFOLD NEPTUNE II (INSTRUMENTS) ×3 IMPLANT
PACK CYSTO (CUSTOM PROCEDURE TRAY) ×3 IMPLANT
PAD ARMBOARD 7.5X6 YLW CONV (MISCELLANEOUS) ×3 IMPLANT
SHEATH URETERAL 12FRX35CM (MISCELLANEOUS) IMPLANT
STENT URET 6FRX26 CONTOUR (STENTS) IMPLANT
SYR 10ML LL (SYRINGE) ×3 IMPLANT
SYR CONTROL 10ML LL (SYRINGE) ×3 IMPLANT
TOWEL OR 17X26 4PK STRL BLUE (TOWEL DISPOSABLE) ×3 IMPLANT
TRACTIP FLEXIVA PULS ID 200XHI (Laser) IMPLANT
TRACTIP FLEXIVA PULSE ID 200 (Laser) ×2
WATER STERILE IRR 500ML POUR (IV SOLUTION) ×3 IMPLANT

## 2022-03-07 NOTE — Anesthesia Postprocedure Evaluation (Signed)
Anesthesia Post Note  Patient: Chad Weaver  Procedure(s) Performed: CYSTOSCOPY WITH RETROGRADE PYELOGRAM, URETEROSCOPY AND STENT PLACEMENT (Left) HOLMIUM LASER APPLICATION (Left) STONE EXTRACTION WITH BASKET (Left: Ureter)  Patient location during evaluation: PACU Anesthesia Type: General Level of consciousness: awake and alert Pain management: pain level controlled Vital Signs Assessment: post-procedure vital signs reviewed and stable Respiratory status: spontaneous breathing, nonlabored ventilation, respiratory function stable and patient connected to nasal cannula oxygen Cardiovascular status: blood pressure returned to baseline and stable Postop Assessment: no apparent nausea or vomiting Anesthetic complications: no   There were no known notable events for this encounter.   Last Vitals:  Vitals:   03/07/22 1300 03/07/22 1321  BP: (!) 116/57 (!) 152/92  Pulse: (!) 113 (!) 110  Resp: (!) 21 20  Temp:  36.5 C  SpO2: 90% 94%    Last Pain:  Vitals:   03/07/22 1321  TempSrc: Oral  PainSc: 8                  Trixie Rude

## 2022-03-07 NOTE — Anesthesia Procedure Notes (Addendum)
Procedure Name: Intubation Date/Time: 03/07/2022 10:57 AM  Performed by: Maude Leriche, CRNAPre-anesthesia Checklist: Patient identified, Emergency Drugs available, Suction available and Patient being monitored Patient Re-evaluated:Patient Re-evaluated prior to induction Oxygen Delivery Method: Circle system utilized Preoxygenation: Pre-oxygenation with 100% oxygen Induction Type: IV induction and Rapid sequence Laryngoscope Size: Glidescope and 4 Grade View: Grade I Tube type: Oral Tube size: 8.0 mm Number of attempts: 1 Airway Equipment and Method: Stylet, Video-laryngoscopy and Bite block Placement Confirmation: ETT inserted through vocal cords under direct vision, positive ETCO2 and breath sounds checked- equal and bilateral Secured at: 24 cm Tube secured with: Tape Dental Injury: Teeth and Oropharynx as per pre-operative assessment  Comments: Elective glidescope for limited AOJ extension & poor dentition

## 2022-03-07 NOTE — H&P (Signed)
Urology Admission H&P  Chief Complaint: left renal calculi  History of Present Illness: [name] is a 32yo here for left ureteroscopy for nephrolithiasis. 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 He has uric acid and calcium oxalate stone. He is on sodium bicarbonate and potassium citrate. Repeat CT from yesterday showed significant decrease in stone burden and patient does not require a PCNL  Past Medical History:  Diagnosis Date   GERD (gastroesophageal reflux disease)    Gout    H/O knee surgery    History of kidney stones    Kidney stones    Past Surgical History:  Procedure Laterality Date   ADENOIDECTOMY     ANTERIOR CRUCIATE LIGAMENT REPAIR     ANTERIOR CRUCIATE LIGAMENT REPAIR Left    CYSTOSCOPY W/ URETERAL STENT PLACEMENT Left 01/17/2022   Procedure: CYSTOSCOPY WITH RETROGRADE PYELOGRAM/URETERAL STENT PLACEMENT;  Surgeon: Primus Bravo., MD;  Location: [facility];  Service: Urology;  Laterality: Left;   CYSTOSCOPY WITH RETROGRADE PYELOGRAM, URETEROSCOPY AND STENT PLACEMENT Bilateral 01/17/2022   Procedure: CYSTOSCOPY WITH BILATERAL RETROGRADE PYELOGRAM;  Surgeon: Primus Bravo., MD;  Location: [facility];  Service: Urology;  Laterality: Bilateral;   TYMPANOSTOMY TUBE PLACEMENT      Home Medications:  Current Facility-Administered Medications  Medication Dose Route Frequency Provider Last Rate Last Admin   cefTRIAXone (ROCEPHIN) 2 g in sodium chloride 0.9 % 100 mL IVPB  2 g Intravenous 30 min Pre-Op Jaydyn Bozzo, Candee Furbish, MD       fentaNYL (SUBLIMAZE) 50 MCG/ML injection            fentaNYL (SUBLIMAZE) injection 50 mcg  50 mcg Intravenous PRN Trixie Rude, MD   50 mcg at 03/07/22 6962   lactated ringers infusion   Intravenous Continuous Pugh, Ellyn Hack, MD       Allergies:  Allergies  Allergen Reactions   Sulfonamide Derivatives Swelling and Rash    History reviewed. No pertinent family history. Social History:  reports that he has  been smoking cigarettes. He has been smoking an average of .5 packs per day. His smokeless tobacco use includes snuff. He reports that he does not drink alcohol and does not use drugs.  Review of Systems  Genitourinary:  Positive for flank pain.  All other systems reviewed and are negative.   Physical Exam:  Vital signs in last 24 hours: Temp:  [98 F (36.7 C)] 98 F (36.7 C) (01/18 0930) Pulse Rate:  [93] 93 (01/18 0930) Resp:  [18] 18 (01/18 0930) BP: (126)/(74) 126/74 (01/18 0930) SpO2:  [97 %] 97 % (01/18 0930) Weight:  [170.1 kg] 170.1 kg (01/18 0930) Physical Exam Vitals reviewed.  Constitutional:      Appearance: Normal appearance.  HENT:     Head: Atraumatic.     Nose: Nose normal. No congestion.     Mouth/Throat:     Mouth: Mucous membranes are dry.  Eyes:     Extraocular Movements: Extraocular movements intact.     Pupils: Pupils are equal, round, and reactive to light.  Cardiovascular:     Rate and Rhythm: Normal rate and regular rhythm.  Pulmonary:     Effort: Pulmonary effort is normal. No respiratory distress.  Abdominal:     General: Abdomen is flat. There is no distension.  Musculoskeletal:        General: No swelling. Normal range of motion.     Cervical back: Normal range of motion and neck supple.  Skin:  General: Skin is warm and dry.  Neurological:     General: No focal deficit present.     Mental Status: He is alert and oriented to person, place, and time.  Psychiatric:        Mood and Affect: Mood normal.        Behavior: Behavior normal.        Thought Content: Thought content normal.        Judgment: Judgment normal.     Laboratory Data:  Results for orders placed or performed during the hospital encounter of 03/07/22 (from the past 24 hour(s))  Glucose, capillary     Status: Abnormal   Collection Time: 03/07/22  9:50 AM  Result Value Ref Range   Glucose-Capillary 226 (H) 70 - 99 mg/dL   No results found for this or any previous  visit (from the past 240 hour(s)). Creatinine: Recent Labs    03/05/22 1515 03/06/22 0815  CREATININE 0.75 0.70   Baseline Creatinine: 0.7  Impression/Assessment:  31yo with left renal calculi  Plan:  -We discussed the management of kidney stones. These options include observation, ureteroscopy, shockwave lithotripsy (ESWL) and percutaneous nephrolithotomy (PCNL). We discussed which options are relevant to the patient's stone(s). We discussed the natural history of kidney stones as well as the complications of untreated stones and the impact on quality of life without treatment as well as with each of the above listed treatments. We also discussed the efficacy of each treatment in its ability to clear the stone burden. With any of these management options I discussed the signs and symptoms of infection and the need for emergent treatment should these be experienced. For each option we discussed the ability of each procedure to clear the patient of their stone burden.   For observation I described the risks which include but are not limited to silent renal damage, life-threatening infection, need for emergent surgery, failure to pass stone and pain.   For ureteroscopy I described the risks which include bleeding, infection, damage to contiguous structures, positioning injury, ureteral stricture, ureteral avulsion, ureteral injury, need for prolonged ureteral stent, inability to perform ureteroscopy, need for an interval procedure, inability to clear stone burden, stent discomfort/pain, heart attack, stroke, pulmonary embolus and the inherent risks with general anesthesia.   For shockwave lithotripsy I described the risks which include arrhythmia, kidney contusion, kidney hemorrhage, need for transfusion, pain, inability to adequately break up stone, inability to pass stone fragments, Steinstrasse, infection associated with obstructing stones, need for alternate surgical procedure, need for repeat  shockwave lithotripsy, MI, CVA, PE and the inherent risks with anesthesia/conscious sedation.   For PCNL I described the risks including positioning injury, pneumothorax, hydrothorax, need for chest tube, inability to clear stone burden, renal laceration, arterial venous fistula or malformation, need for embolization of kidney, loss of kidney or renal function, need for repeat procedure, need for prolonged nephrostomy tube, ureteral avulsion, MI, CVA, PE and the inherent risks of general anesthesia.   - The patient would like to proceed with left ureteroscopic stone extraction  Nicolette Bang 03/07/2022, 10:34 AM

## 2022-03-07 NOTE — Op Note (Signed)
.  Preoperative diagnosis: Left renal stone  Postoperative diagnosis: Same  Procedure: 1 cystoscopy 2. Left retrograde pyelography 3.  Intraoperative fluoroscopy, under one hour, with interpretation 4.  Left ureteroscopic stone manipulation with laser lithotripsy 5.  Left 6 x 26 JJ stent placement  Attending: Rosie Fate  Anesthesia: General  Estimated blood loss: None  Drains: Left 6 x 26 JJ ureteral stent with tether  Specimens: stone for analysis  Antibiotics: rocephin  Findings: left renal pelvis stone. No hydronephrosis. No masses/lesions in the bladder. Ureteral orifices in normal anatomic location.  Indications: Patient is a 32 year old male with a history of left renal stone.  After discussing treatment options, he decided proceed with left ureteroscopic stone manipulation.  Procedure in detail: The patient was brought to the operating room and a brief timeout was done to ensure correct patient, correct procedure, correct site.  General anesthesia was administered patient was placed in dorsal lithotomy position.  Her genitalia was then prepped and draped in usual sterile fashion.  A rigid 86 French cystoscope was passed in the urethra and the bladder.  Bladder was inspected free masses or lesions.  the ureteral orifices were in the normal orthotopic locations.  a 6 french ureteral catheter was then instilled into the left ureteral orifice.  a gentle retrograde was obtained and findings noted above. Using a grasper the left ureteral stent was brought to the urethral meatus. we then placed a zip wire through the ureteral stent and advanced up to the renal pelvis.  We removed the stent. we then removed the cystoscope and cannulated the left ureteral orifice with a semirigid ureteroscope.  No stone was found in the ureter. Once we reached the UPJ a sensor wire was advanced in to the renal pelvis. We then removed the ureteroscope and advanced am 12/14 x 38cm access sheath up to the  renal pelvis. We then used the flexible ureteroscope to perform nephroscopy. We encountered the stone in the renal pelvis. Using a 242 nm laser fiber the stone was dusted and the larger fragments were removed with an NGage basket.   once all stone fragments were removed we then removed the access sheath under direct vision and noted no injury to the ureter. We then placed a 6 x 26 double-j ureteral stent over the original zip wire.  We then removed the wire and good coil was noted in the the renal pelvis under fluoroscopy and the bladder under direct vision. the bladder was then drained and this concluded the procedure which was well tolerated by patient.  Complications: None  Condition: Stable, extubated, transferred to PACU  Plan: Patient is to be discharged home as to follow-up in one week. He is to remove his stent in 72 hours by pulling the tether

## 2022-03-07 NOTE — Anesthesia Preprocedure Evaluation (Signed)
Anesthesia Evaluation  Patient identified by MRN, date of birth, ID band Patient awake    Reviewed: Allergy & Precautions, NPO status , Patient's Chart, lab work & pertinent test results  Airway Mallampati: III  TM Distance: >3 FB Neck ROM: Full    Dental  (+) Dental Advisory Given, Chipped Bilateral chipped molars:   Pulmonary Current Smoker and Patient abstained from smoking.   Pulmonary exam normal breath sounds clear to auscultation       Cardiovascular Exercise Tolerance: Good negative cardio ROS Normal cardiovascular exam Rhythm:Regular Rate:Normal     Neuro/Psych  PSYCHIATRIC DISORDERS Anxiety     negative neurological ROS     GI/Hepatic ,GERD  Controlled,,(+)     substance abuse    Endo/Other    Morbid obesity  Renal/GU Renal disease (stones)     Musculoskeletal negative musculoskeletal ROS (+)  narcotic dependent  Abdominal   Peds  Hematology negative hematology ROS (+)   Anesthesia Other Findings   Reproductive/Obstetrics negative OB ROS                             Anesthesia Physical Anesthesia Plan  ASA: 3  Anesthesia Plan: General   Post-op Pain Management: Dilaudid IV   Induction:   PONV Risk Score and Plan: 3 and Ondansetron, Dexamethasone and Midazolam  Airway Management Planned: Oral ETT  Additional Equipment:   Intra-op Plan:   Post-operative Plan: Extubation in OR  Informed Consent: I have reviewed the patients History and Physical, chart, labs and discussed the procedure including the risks, benefits and alternatives for the proposed anesthesia with the patient or authorized representative who has indicated his/her understanding and acceptance.       Plan Discussed with: CRNA  Anesthesia Plan Comments:        Anesthesia Quick Evaluation

## 2022-03-07 NOTE — Discharge Instructions (Signed)
72 hours  stent removal 03/10/2022

## 2022-03-07 NOTE — Transfer of Care (Signed)
Immediate Anesthesia Transfer of Care Note  Patient: LASH MATULICH  Procedure(s) Performed: CYSTOSCOPY WITH RETROGRADE PYELOGRAM, URETEROSCOPY AND STENT PLACEMENT (Left) HOLMIUM LASER APPLICATION (Left) STONE EXTRACTION WITH BASKET (Left: Ureter)  Patient Location: PACU  Anesthesia Type:General  Level of Consciousness: awake, alert , and oriented  Airway & Oxygen Therapy: Patient Spontanous Breathing and Patient connected to face mask oxygen  Post-op Assessment: Report given to RN, Post -op Vital signs reviewed and stable, Patient moving all extremities X 4, and Patient able to stick tongue midline  Post vital signs: Reviewed  Last Vitals:  Vitals Value Taken Time  BP 156/82 03/07/22 1226  Temp 36.7 C 03/07/22 1226  Pulse 109 03/07/22 1226  Resp 12 03/07/22 1226  SpO2 90 % 03/07/22 1226    Last Pain:  Vitals:   03/07/22 1226  TempSrc:   PainSc: 7       Patients Stated Pain Goal: 3 (05/02/92 5859)  Complications: No notable events documented.

## 2022-03-12 ENCOUNTER — Encounter (HOSPITAL_COMMUNITY): Payer: Self-pay | Admitting: Urology

## 2022-03-12 ENCOUNTER — Encounter: Payer: Self-pay | Admitting: Urology

## 2022-03-13 ENCOUNTER — Ambulatory Visit (INDEPENDENT_AMBULATORY_CARE_PROVIDER_SITE_OTHER): Payer: Self-pay | Admitting: Urology

## 2022-03-13 VITALS — BP 138/94 | HR 114

## 2022-03-13 DIAGNOSIS — R21 Rash and other nonspecific skin eruption: Secondary | ICD-10-CM

## 2022-03-13 DIAGNOSIS — N200 Calculus of kidney: Secondary | ICD-10-CM

## 2022-03-13 MED ORDER — CLOTRIMAZOLE-BETAMETHASONE 1-0.05 % EX CREA: 1.0000 | TOPICAL_CREAM | Freq: Two times a day (BID) | CUTANEOUS | 3 refills | Status: AC

## 2022-03-13 NOTE — Progress Notes (Signed)
03/13/2022 11:39 AM   Chad Weaver Chad Weaver 10/08/1990 [id number]  Referring provider: No referring provider defined for this encounter.  Followup nephrolithiasis   HPI: [name] is a 32yo here for followup for nephrolithiasis. He removed his tethered stent POD#3. He denies nay flank pain. He has not passed any fragments since surgery. He is on UrocitK and sodium bicarbonate. No significant LUTS.    PMH: Past Medical History:  Diagnosis Date   GERD (gastroesophageal reflux disease)    Gout    H/O knee surgery    History of kidney stones    Kidney stones     Surgical History: Past Surgical History:  Procedure Laterality Date   ADENOIDECTOMY     ANTERIOR CRUCIATE LIGAMENT REPAIR     ANTERIOR CRUCIATE LIGAMENT REPAIR Left    CYSTOSCOPY W/ URETERAL STENT PLACEMENT Left 01/17/2022   Procedure: CYSTOSCOPY WITH RETROGRADE PYELOGRAM/URETERAL STENT PLACEMENT;  Surgeon: Milderd Meager., MD;  Location: [facility];  Service: Urology;  Laterality: Left;   CYSTOSCOPY WITH RETROGRADE PYELOGRAM, URETEROSCOPY AND STENT PLACEMENT Bilateral 01/17/2022   Procedure: CYSTOSCOPY WITH BILATERAL RETROGRADE PYELOGRAM;  Surgeon: Milderd Meager., MD;  Location: [facility];  Service: Urology;  Laterality: Bilateral;   CYSTOSCOPY WITH RETROGRADE PYELOGRAM, URETEROSCOPY AND STENT PLACEMENT Left 03/07/2022   Procedure: CYSTOSCOPY WITH RETROGRADE PYELOGRAM, URETEROSCOPY AND STENT PLACEMENT;  Surgeon: Malen Gauze, MD;  Location: [facility];  Service: Urology;  Laterality: Left;   HOLMIUM LASER APPLICATION Left 03/07/2022   Procedure: HOLMIUM LASER APPLICATION;  Surgeon: Malen Gauze, MD;  Location: [facility];  Service: Urology;  Laterality: Left;   STONE EXTRACTION WITH BASKET Left 03/07/2022   Procedure: STONE EXTRACTION WITH BASKET;  Surgeon: Malen Gauze, MD;  Location: [facility];  Service: Urology;  Laterality: Left;   TYMPANOSTOMY TUBE PLACEMENT      Home Medications:  Allergies as of  03/13/2022       Reactions   Sulfonamide Derivatives Swelling, Rash        Medication List        Accurate as of March 13, 2022 11:39 AM. If you have any questions, ask your nurse or doctor.          alfuzosin 10 MG 24 hr tablet Commonly known as: Uroxatral Take 1 tablet (10 mg total) by mouth at bedtime.   allopurinol 100 MG tablet Commonly known as: Zyloprim Take 1 tablet (100 mg total) by mouth daily.   ondansetron 4 MG tablet Commonly known as: Zofran Take 1 tablet (4 mg total) by mouth daily as needed for nausea or vomiting.   oxyCODONE 5 MG immediate release tablet Commonly known as: Oxy IR/ROXICODONE Take 1 tablet (5 mg total) by mouth every 6 (six) hours as needed for severe pain.   phenazopyridine 200 MG tablet Commonly known as: Pyridium Take 1 tablet (200 mg total) by mouth 3 (three) times daily as needed for pain.   Potassium Citrate 15 MEQ (1620 MG) Tbcr Take 2 tablets by mouth 2 (two) times daily.   sodium bicarbonate 650 MG tablet Take 1 tablet (650 mg total) by mouth 2 (two) times daily.        Allergies:  Allergies  Allergen Reactions   Sulfonamide Derivatives Swelling and Rash    Family History: No family history on file.  Social History:  reports that he has been smoking cigarettes. He has been smoking an average of .5 packs per day. His smokeless tobacco use includes snuff. He reports that he  does not drink alcohol and does not use drugs.  ROS: All other review of systems were reviewed and are negative except what is noted above in HPI  Physical Exam: BP (!) 138/94   Pulse (!) 114   Constitutional:  Alert and oriented, No acute distress. HEENT: [place] AT, moist mucus membranes.  Trachea midline, no masses. Cardiovascular: No clubbing, cyanosis, or edema. Respiratory: Normal respiratory effort, no increased work of breathing. GI: Abdomen is soft, nontender, nondistended, no abdominal masses GU: No CVA tenderness.  Lymph: No  cervical or inguinal lymphadenopathy. Skin: No rashes, bruises or suspicious lesions. Neurologic: Grossly intact, no focal deficits, moving all 4 extremities. Psychiatric: Normal mood and affect.  Laboratory Data: Lab Results  Component Value Date   WBC 15.3 (H) 07/05/2014   HGB 15.0 07/05/2014   HCT 43.4 07/05/2014   MCV 86.6 07/05/2014   PLT 305 07/05/2014    Lab Results  Component Value Date   CREATININE 0.70 03/06/2022    No results found for: "PSA"  No results found for: "TESTOSTERONE"  Lab Results  Component Value Date   HGBA1C 9.5 (H) 01/16/2022    Urinalysis    Component Value Date/Time   COLORURINE YELLOW 05/15/2021 1809   APPEARANCEUR Clear 01/22/2022 1343   LABSPEC 1.018 05/15/2021 1809   PHURINE 5.0 05/15/2021 1809   GLUCOSEU CANCELED 01/22/2022 1343   HGBUR LARGE (A) 05/15/2021 1809   BILIRUBINUR Negative 01/22/2022 1343   KETONESUR 5 (A) 05/15/2021 1809   PROTEINUR CANCELED 01/22/2022 1343   PROTEINUR 30 (A) 05/15/2021 1809   UROBILINOGEN 0.2 11/24/2013 1715   NITRITE Negative 01/14/2022 1130   NITRITE NEGATIVE 05/15/2021 1809   LEUKOCYTESUR Negative 01/14/2022 1130   LEUKOCYTESUR NEGATIVE 05/15/2021 1809    Lab Results  Component Value Date   LABMICR See below: 01/22/2022   WBCUA 6-10 (A) 01/22/2022   LABEPIT 0-10 01/22/2022   MUCUS Present (A) 12/31/2021   BACTERIA None seen 01/22/2022    Pertinent Imaging:  Results for orders placed during the hospital encounter of 12/31/21  Abdomen 1 view (KUB)  Narrative CLINICAL DATA:  Left flank pain.  Nephrolithiasis.  EXAM: ABDOMEN - 1 VIEW  COMPARISON:  Abdominopelvic CT 11/09/2021.  FINDINGS: Two supine views of the abdomen are submitted. Partial staghorn left renal calculus demonstrated by CT is not well seen radiographically. No new calcifications are seen along the expected course of the ureters. The visualized bowel gas pattern is normal. The bones appear  unremarkable.  IMPRESSION: Known partial staghorn left renal calculus is not well seen radiographically.   Electronically Signed By: Richardean Sale M.D. On: 12/31/2021 12:40  No results found for this or any previous visit.  No results found for this or any previous visit.  No results found for this or any previous visit.  No results found for this or any previous visit.  No valid procedures specified. No results found for this or any previous visit.  Results for orders placed in visit on 11/02/21  CT RENAL STONE STUDY  Narrative CLINICAL DATA:  Left flank pain nephrolithiasis.  Hematuria.  EXAM: CT ABDOMEN AND PELVIS WITHOUT CONTRAST  TECHNIQUE: Multidetector CT imaging of the abdomen and pelvis was performed following the standard protocol without IV contrast.  RADIATION DOSE REDUCTION: This exam was performed according to the departmental dose-optimization program which includes automated exposure control, adjustment of the mA and/or kV according to patient size and/or use of iterative reconstruction technique.  COMPARISON:  Multiple priors including most recent CT  May 15, 2021  FINDINGS: Lower chest: No acute abnormality.  Hepatobiliary: Similar marked diffuse hepatic steatosis with focal fatty sparing along the gallbladder fossa. Gallbladder is distended without signs of acute inflammation. No biliary ductal dilation.  Pancreas: No pancreatic ductal dilation or evidence of acute inflammation.  Spleen: No splenomegaly.  Adrenals/Urinary Tract: Bilateral adrenal glands appear normal.  No hydronephrosis. 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 Stranding along the left lower pole renal calices and the renal pelvis. No obstructive ureteral or bladder calculi identified. Urinary bladder is unremarkable for degree of distension.  Stomach/Bowel: No radiopaque enteric contrast material was administered. Stomach is unremarkable  for degree of distension. No pathologic dilation of small or large bowel. No evidence of acute bowel inflammation.  Vascular/Lymphatic: Normal caliber abdominal aorta. Similar prominent portacaval and periportal lymph nodes measuring up to 12 mm in short axis on image 37/2 favored reactive in the setting of hepatic steatosis.  Reproductive: Prostate is unremarkable.  Other: No significant abdominopelvic free fluid.  Musculoskeletal: No acute or significant osseous findings.  IMPRESSION: 1. 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 2. No obstructive uropathy. 3. Similar marked diffuse hepatic steatosis with focal fatty sparing along the gallbladder fossa.   Electronically Signed By: Dahlia Bailiff M.D. On: 11/09/2021 16:00   Assessment & Plan:    1. Nephrolithiasis -followup 6 weeks with renal US  2. Scrotal rash Clotrimazole BID nfor 3 weeks   No follow-ups on file.  Nicolette Bang, MD  [facility] [place]

## 2022-03-15 LAB — CALCULI, WITH PHOTOGRAPH (CLINICAL LAB)
Ammonium Acid Urate Calculi: 80 %
Uric Acid Calculi: 20 %
Weight Calculi: 50 mg

## 2022-03-19 ENCOUNTER — Encounter: Payer: Self-pay | Admitting: Urology

## 2022-03-19 NOTE — Patient Instructions (Signed)

## 2022-03-20 NOTE — Telephone Encounter (Signed)
Please see message. °

## 2022-03-29 ENCOUNTER — Ambulatory Visit (HOSPITAL_COMMUNITY)
Admission: RE | Admit: 2022-03-29 | Discharge: 2022-03-29 | Disposition: A | Discharge status: Home / Self Care | Payer: Self-pay | Source: Ambulatory Visit | Attending: Urology | Admitting: Urology

## 2022-03-29 DIAGNOSIS — N200 Calculus of kidney: Secondary | ICD-10-CM | POA: Insufficient documentation

## 2022-04-08 ENCOUNTER — Telehealth: Payer: Self-pay

## 2022-04-08 DIAGNOSIS — N200 Calculus of kidney: Secondary | ICD-10-CM

## 2022-04-08 MED ORDER — SODIUM BICARBONATE 650 MG PO TABS: 650.0000 mg | ORAL_TABLET | Freq: Two times a day (BID) | ORAL | 2 refills | Status: DC

## 2022-04-08 NOTE — Telephone Encounter (Signed)
Patient is aware that  a rx for sodium bicarbonate was sent to his pharmacy with refill. Patient voiced understanding.

## 2022-04-08 NOTE — Telephone Encounter (Signed)
Patient aware that a task will be sent to the MD for advisement on contiuned the sodium bicarbonate 650 MG tablet. Patient voiced understanding

## 2022-04-08 NOTE — Telephone Encounter (Signed)
sodium bicarbonate 650 MG tablet   Needing a refill. Will he need to continue taking Rx?  Please advise.

## 2022-04-29 ENCOUNTER — Ambulatory Visit: Payer: Self-pay | Admitting: Urology

## 2022-04-29 DIAGNOSIS — N200 Calculus of kidney: Secondary | ICD-10-CM

## 2022-05-27 ENCOUNTER — Other Ambulatory Visit: Payer: Self-pay | Admitting: Urology

## 2022-05-27 DIAGNOSIS — N200 Calculus of kidney: Secondary | ICD-10-CM

## 2022-07-14 ENCOUNTER — Emergency Department (HOSPITAL_COMMUNITY)
Admission: EM | Admit: 2022-07-14 | Discharge: 2022-07-14 | Disposition: A | Discharge status: Home / Self Care | Payer: Self-pay | Attending: Emergency Medicine | Admitting: Emergency Medicine

## 2022-07-14 ENCOUNTER — Encounter (HOSPITAL_COMMUNITY): Payer: Self-pay | Admitting: Emergency Medicine

## 2022-07-14 ENCOUNTER — Other Ambulatory Visit: Payer: Self-pay

## 2022-07-14 DIAGNOSIS — F1193 Opioid use, unspecified with withdrawal: Secondary | ICD-10-CM | POA: Insufficient documentation

## 2022-07-14 LAB — COMPREHENSIVE METABOLIC PANEL
ALT: 34 U/L (ref 0–44)
AST: 22 U/L (ref 15–41)
Albumin: 3.8 g/dL (ref 3.5–5.0)
Alkaline Phosphatase: 75 U/L (ref 38–126)
Anion gap: 14 (ref 5–15)
BUN: 13 mg/dL (ref 6–20)
CO2: 20 mmol/L — ABNORMAL LOW (ref 22–32)
Calcium: 8.8 mg/dL — ABNORMAL LOW (ref 8.9–10.3)
Chloride: 102 mmol/L (ref 98–111)
Creatinine, Ser: 0.77 mg/dL (ref 0.61–1.24)
GFR, Estimated: >60 mL/min (ref >60)
Glucose, Bld: 245 mg/dL — ABNORMAL HIGH (ref 70–99)
Potassium: 3.9 mmol/L (ref 3.5–5.1)
Sodium: 136 mmol/L (ref 135–145)
Total Bilirubin: 0.8 mg/dL (ref 0.3–1.2)
Total Protein: 7.4 g/dL (ref 6.5–8.1)

## 2022-07-14 LAB — CBC
HCT: 44.3 % (ref 39.0–52.0)
Hemoglobin: 15.4 g/dL (ref 13.0–17.0)
MCH: 29.3 pg (ref 26.0–34.0)
MCHC: 34.8 g/dL (ref 30.0–36.0)
MCV: 84.2 fL (ref 80.0–100.0)
Platelets: 264 10*3/uL (ref 150–400)
RBC: 5.26 MIL/uL (ref 4.22–5.81)
RDW: 12.7 % (ref 11.5–15.5)
WBC: 13.6 10*3/uL — ABNORMAL HIGH (ref 4.0–10.5)
nRBC: 0 % (ref 0.0–0.2)

## 2022-07-14 LAB — ACETAMINOPHEN LEVEL: Acetaminophen (Tylenol), Serum: <10 ug/mL — ABNORMAL LOW (ref 10–30)

## 2022-07-14 LAB — ETHANOL: Alcohol, Ethyl (B): <10 mg/dL (ref <10)

## 2022-07-14 LAB — SALICYLATE LEVEL: Salicylate Lvl: <7 mg/dL — ABNORMAL LOW (ref 7.0–30.0)

## 2022-07-14 MED ORDER — LOPERAMIDE HCL 2 MG PO CAPS
4.0000 mg | ORAL_CAPSULE | Freq: Once | ORAL | Status: AC
  Administered 2022-07-14: 4 mg via ORAL
  Filled 2022-07-14: qty 2

## 2022-07-14 MED ORDER — NAPROXEN 250 MG PO TABS
500.0000 mg | ORAL_TABLET | Freq: Once | ORAL | Status: AC
  Administered 2022-07-14: 500 mg via ORAL
  Filled 2022-07-14: qty 2

## 2022-07-14 MED ORDER — ONDANSETRON 8 MG PO TBDP
8.0000 mg | ORAL_TABLET | Freq: Once | ORAL | Status: AC
  Administered 2022-07-14: 8 mg via ORAL
  Filled 2022-07-14: qty 1

## 2022-07-14 MED ORDER — CLONIDINE HCL 0.1 MG PO TABS
0.1000 mg | ORAL_TABLET | Freq: Once | ORAL | Status: AC
  Administered 2022-07-14: 0.1 mg via ORAL
  Filled 2022-07-14: qty 1

## 2022-07-14 MED ORDER — ACETAMINOPHEN 325 MG PO TABS
650.0000 mg | ORAL_TABLET | Freq: Once | ORAL | Status: AC
  Administered 2022-07-14: 650 mg via ORAL
  Filled 2022-07-14: qty 2

## 2022-07-14 NOTE — ED Notes (Signed)
Security called to wand pt and pt's visitor 

## 2022-07-14 NOTE — ED Notes (Signed)
ED Provider at bedside. 

## 2022-07-14 NOTE — ED Notes (Signed)
Pt has changed into burgundy scrubs.  Security has wanded pt. Wife has pt's cell phone in her possession.

## 2022-07-14 NOTE — ED Triage Notes (Addendum)
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 Pt had a strip of suboxone this morning at around 5am and then began experiencing withdrawal symptoms. Pt was a daily fentanyl user, at least half a gram a day, smoked. Currently reports symptoms including severe back pain, body aches, cold chills, n/v, diarrhea, runny nose. No other meds PTA, no previous severe withdrawal symptoms. Pt admits he has been feeling suicidal all day because of his symptoms r/t withdrawal.

## 2022-07-14 NOTE — Discharge Instructions (Signed)
Please go directly to the behavioral health urgent care at the address provided.  They should have dedicated professional so should be able to help with your symptoms and provide you more resources than what emergency room can even provide.

## 2022-07-14 NOTE — ED Notes (Signed)
Patient dressed out in Boxers, and [place] Scrubs

## 2022-07-14 NOTE — ED Provider Notes (Signed)
[place] EMERGENCY DEPARTMENT AT [facility] Provider Note   CSN: [id number] Arrival date & time: 07/14/22  1607     History  Chief Complaint  Patient presents with   Withdrawal    Chad Weaver is a 32 y.o. male.  HPI    32 year old male comes in with chief complaint of withdrawal symptoms.  Patient admits to opiate use disorder.  He has been using fentanyl regularly for the last several days.  This morning he took a Suboxone strip and his symptoms worsened.  He proceeded to take additional fentanyl, but his symptoms did not respond.  Patient is having bodyaches, chills, nausea, vomiting, diarrhea and runny nose.  He is also feeling restless and anxious.  Patient does not have any suicidal ideation.  He wants to get better, he is seeking help right now to see if he can provide him with resources and medications that will make him better.  He wants to stop using fentanyl.  In the past patient had pill use disorder.  He states that he was clean for several years until 2 years ago.   Home Medications Prior to Admission medications   Medication Sig Start Date End Date Taking? Authorizing Provider  alfuzosin (UROXATRAL) 10 MG 24 hr tablet Take 1 tablet (10 mg total) by mouth at bedtime. 03/07/22   McKenzie, Mardene Celeste, MD  allopurinol (ZYLOPRIM) 100 MG tablet Take 1 tablet (100 mg total) by mouth daily. 12/21/21   Stoneking, Danford Bad., MD  clotrimazole-betamethasone (LOTRISONE) cream Apply 1 Application topically 2 (two) times daily. 03/13/22   McKenzie, Mardene Celeste, MD  ondansetron (ZOFRAN) 4 MG tablet Take 1 tablet (4 mg total) by mouth daily as needed for nausea or vomiting. 03/07/22 03/07/23  Malen Gauze, MD  oxyCODONE (OXY IR/ROXICODONE) 5 MG immediate release tablet Take 1 tablet (5 mg total) by mouth every 6 (six) hours as needed for severe pain. 03/07/22   McKenzie, Mardene Celeste, MD  phenazopyridine (PYRIDIUM) 200 MG tablet Take 1 tablet (200 mg total) by mouth 3  (three) times daily as needed for pain. Patient not taking: Reported on 03/13/2022 01/17/22 01/17/23  Milderd Meager., MD  Potassium Citrate 15 MEQ (1620 MG) TBCR Take 2 tablets by mouth twice daily 05/27/22   Stoneking, Danford Bad., MD  sodium bicarbonate 650 MG tablet Take 1 tablet (650 mg total) by mouth 2 (two) times daily. 04/08/22   McKenzie, Mardene Celeste, MD      Allergies    Sulfonamide derivatives    Review of Systems   Review of Systems  All other systems reviewed and are negative.   Physical Exam Updated Vital Signs BP 135/84   Pulse 88   Temp 98.4 F (36.9 C) (Oral)   Resp 18   Ht 5\' 11"  (1.803 m)   Wt (!) 163.3 kg   SpO2 98%   BMI 50.21 kg/m  Physical Exam Vitals and nursing note reviewed.  Constitutional:      Appearance: He is well-developed.  HENT:     Head: Atraumatic.  Eyes:     Extraocular Movements: Extraocular movements intact.     Pupils: Pupils are equal, round, and reactive to light.  Cardiovascular:     Rate and Rhythm: Normal rate.  Pulmonary:     Effort: Pulmonary effort is normal.  Musculoskeletal:     Cervical back: Neck supple.  Skin:    General: Skin is warm.  Neurological:     Mental Status: He is  alert and oriented to person, place, and time.  Psychiatric:        Mood and Affect: Mood normal.        Behavior: Behavior normal.        Thought Content: Thought content normal.        Judgment: Judgment normal.     ED Results / Procedures / Treatments   Labs (all labs ordered are listed, but only abnormal results are displayed) Labs Reviewed  COMPREHENSIVE METABOLIC PANEL - Abnormal; Notable for the following components:      Result Value   CO2 20 (*)    Glucose, Bld 245 (*)    Calcium 8.8 (*)    All other components within normal limits  SALICYLATE LEVEL - Abnormal; Notable for the following components:   Salicylate Lvl <7.0 (*)    All other components within normal limits  ACETAMINOPHEN LEVEL - Abnormal; Notable for the  following components:   Acetaminophen (Tylenol), Serum <10 (*)    All other components within normal limits  CBC - Abnormal; Notable for the following components:   WBC 13.6 (*)    All other components within normal limits  ETHANOL  RAPID URINE DRUG SCREEN, HOSP PERFORMED    EKG None  Radiology No results found.  Procedures Procedures    Medications Ordered in ED Medications  ondansetron (ZOFRAN-ODT) disintegrating tablet 8 mg (8 mg Oral Given 07/14/22 1722)  acetaminophen (TYLENOL) tablet 650 mg (650 mg Oral Given 07/14/22 1808)  naproxen (NAPROSYN) tablet 500 mg (500 mg Oral Given 07/14/22 1808)  loperamide (IMODIUM) capsule 4 mg (4 mg Oral Given 07/14/22 1807)  cloNIDine (CATAPRES) tablet 0.1 mg (0.1 mg Oral Given 07/14/22 1808)    ED Course/ Medical Decision Making/ A&P Clinical Course as of 07/14/22 1813  Sun Jul 14, 2022  1812 Patient's labs are reassuring.  I have given him medications for opiate withdrawal symptoms.  I called behavioral health urgent care in [place] and the will be happy to see the patient.  Patient's wife is willing to take him to the [facility].  Patient comfortable with the plan. [AN]    Clinical Course User Index [AN] Derwood Kaplan, MD                             Medical Decision Making Amount and/or Complexity of Data Reviewed Labs: ordered.  Risk OTC drugs. Prescription drug management.  32 year old patient comes in with chief complaint of drug withdrawals.  He has history of fentanyl use disorder.  Patient has made passive suicide gestures, but states that he wants to get better, is motivated to improve.  Wife is at the bedside.  According to the patient, he took a Suboxone strip earlier today.  That tripped him into withdrawals.  He thereafter took fentanyl, but had no improvement.  He wants to stop using fentanyl.  Wife is at the bedside.  She states that patient has not tried to kill himself and has not made any serious disclosures of  that nature to her.  They have 2 kids, and recently one of the daughters asked the patient what the powder was, which really impacted him adversely.  Final Clinical Impression(s) / ED Diagnoses Final diagnoses:  Opiate withdrawal [facility])    Rx / DC Orders ED Discharge Orders     None         Derwood Kaplan, MD 07/14/22 1813

## 2022-07-15 DIAGNOSIS — F1120 Opioid dependence, uncomplicated: Secondary | ICD-10-CM | POA: Insufficient documentation

## 2022-08-12 ENCOUNTER — Telehealth: Payer: Self-pay

## 2022-08-12 DIAGNOSIS — N200 Calculus of kidney: Secondary | ICD-10-CM

## 2022-08-12 NOTE — Telephone Encounter (Signed)
Patient called and advised they needed a refill on medication below.   Medication: Potassium Citrate 15 MEQ (1620 MG) TBCR sodium bicarbonate 650 MG tablet     Pharmacy: [facility] [address], [place] - [address]    Thank you

## 2022-08-13 MED ORDER — POTASSIUM CITRATE ER 15 MEQ (1620 MG) PO TBCR
2.0000 | EXTENDED_RELEASE_TABLET | Freq: Two times a day (BID) | ORAL | 2 refills | Status: DC
Start: 2022-08-13 — End: 2022-09-13

## 2022-08-13 MED ORDER — SODIUM BICARBONATE 650 MG PO TABS
650.0000 mg | ORAL_TABLET | Freq: Two times a day (BID) | ORAL | 2 refills | Status: DC
Start: 2022-08-13 — End: 2022-09-13

## 2022-08-13 NOTE — Telephone Encounter (Signed)
Per Dr. Ronne Binning it's okay to refill medication. Patient is made aware of Dr.McKenzie recommendation and voiced understanding.

## 2022-09-13 ENCOUNTER — Ambulatory Visit (INDEPENDENT_AMBULATORY_CARE_PROVIDER_SITE_OTHER): Payer: Medicaid Other | Admitting: Urology

## 2022-09-13 VITALS — BP 120/84 | HR 80 | Ht 71.0 in | Wt 360.0 lb

## 2022-09-13 DIAGNOSIS — N200 Calculus of kidney: Secondary | ICD-10-CM

## 2022-09-13 LAB — URINALYSIS, ROUTINE W REFLEX MICROSCOPIC
Bilirubin, UA: NEGATIVE
Leukocytes,UA: NEGATIVE
Nitrite, UA: NEGATIVE
Protein,UA: NEGATIVE
RBC, UA: NEGATIVE
Specific Gravity, UA: 1.02 (ref 1.005–1.030)
Urobilinogen, Ur: 0.2 mg/dL (ref 0.2–1.0)
pH, UA: 6 (ref 5.0–7.5)

## 2022-09-13 MED ORDER — SODIUM BICARBONATE 650 MG PO TABS: 650.0000 mg | ORAL_TABLET | Freq: Two times a day (BID) | ORAL | 2 refills | Status: DC

## 2022-09-13 MED ORDER — POTASSIUM CITRATE ER 15 MEQ (1620 MG) PO TBCR: 1.0000 | EXTENDED_RELEASE_TABLET | Freq: Two times a day (BID) | ORAL | 2 refills | Status: DC

## 2022-09-13 NOTE — Progress Notes (Unsigned)
09/13/2022 11:03 AM   [name] [name] Apr 14, 1990 [id number]  Referring provider: No referring provider defined for this encounter.  nephrolithiasis   HPI: [name] is a 32yo here for followup for nephrolithiasis. No stone events since last visit. He started passing sediment 2-3 weeks ago.  He stopped the sodium bicarb and urocit K. No dysuria or hematuria. No other comoplaints today   PMH: Past Medical History:  Diagnosis Date   GERD (gastroesophageal reflux disease)    Gout    H/O knee surgery    History of kidney stones    Kidney stones     Surgical History: Past Surgical History:  Procedure Laterality Date   ADENOIDECTOMY     ANTERIOR CRUCIATE LIGAMENT REPAIR     ANTERIOR CRUCIATE LIGAMENT REPAIR Left    CYSTOSCOPY W/ URETERAL STENT PLACEMENT Left 01/17/2022   Procedure: CYSTOSCOPY WITH RETROGRADE PYELOGRAM/URETERAL STENT PLACEMENT;  Surgeon: Milderd Meager., MD;  Location: [facility];  Service: Urology;  Laterality: Left;   CYSTOSCOPY WITH RETROGRADE PYELOGRAM, URETEROSCOPY AND STENT PLACEMENT Bilateral 01/17/2022   Procedure: CYSTOSCOPY WITH BILATERAL RETROGRADE PYELOGRAM;  Surgeon: Milderd Meager., MD;  Location: [facility];  Service: Urology;  Laterality: Bilateral;   CYSTOSCOPY WITH RETROGRADE PYELOGRAM, URETEROSCOPY AND STENT PLACEMENT Left 03/07/2022   Procedure: CYSTOSCOPY WITH RETROGRADE PYELOGRAM, URETEROSCOPY AND STENT PLACEMENT;  Surgeon: Malen Gauze, MD;  Location: [facility];  Service: Urology;  Laterality: Left;   HOLMIUM LASER APPLICATION Left 03/07/2022   Procedure: HOLMIUM LASER APPLICATION;  Surgeon: Malen Gauze, MD;  Location: [facility];  Service: Urology;  Laterality: Left;   STONE EXTRACTION WITH BASKET Left 03/07/2022   Procedure: STONE EXTRACTION WITH BASKET;  Surgeon: Malen Gauze, MD;  Location: [facility];  Service: Urology;  Laterality: Left;   TYMPANOSTOMY TUBE PLACEMENT      Home Medications:  Allergies as of 09/13/2022        Reactions   Sulfonamide Derivatives Swelling, Rash        Medication List        Accurate as of September 13, 2022 11:03 AM. If you have any questions, ask your nurse or doctor.          alfuzosin 10 MG 24 hr tablet Commonly known as: Uroxatral Take 1 tablet (10 mg total) by mouth at bedtime.   allopurinol 100 MG tablet Commonly known as: Zyloprim Take 1 tablet (100 mg total) by mouth daily.   clotrimazole-betamethasone cream Commonly known as: Lotrisone Apply 1 Application topically 2 (two) times daily.   ondansetron 4 MG tablet Commonly known as: Zofran Take 1 tablet (4 mg total) by mouth daily as needed for nausea or vomiting.   oxyCODONE 5 MG immediate release tablet Commonly known as: Oxy IR/ROXICODONE Take 1 tablet (5 mg total) by mouth every 6 (six) hours as needed for severe pain.   phenazopyridine 200 MG tablet Commonly known as: Pyridium Take 1 tablet (200 mg total) by mouth 3 (three) times daily as needed for pain.   Potassium Citrate 15 MEQ (1620 MG) Tbcr Take 1 tablet by mouth 2 (two) times daily. What changed: how much to take   sodium bicarbonate 650 MG tablet Take 1 tablet (650 mg total) by mouth 2 (two) times daily.        Allergies:  Allergies  Allergen Reactions   Sulfonamide Derivatives Swelling and Rash    Family History: No family history on file.  Social History:  reports that he has been smoking cigarettes.  His smokeless tobacco use includes snuff. He reports that he does not drink alcohol and does not use drugs.  ROS: All other review of systems were reviewed and are negative except what is noted above in HPI  Physical Exam: BP 120/84   Pulse 80   Ht 5\' 11"  (1.803 m)   Wt (!) 360 lb (163.3 kg)   BMI 50.21 kg/m   Constitutional:  Alert and oriented, No acute distress. HEENT: [place] AT, moist mucus membranes.  Trachea midline, no masses. Cardiovascular: No clubbing, cyanosis, or edema. Respiratory: Normal respiratory  effort, no increased work of breathing. GI: Abdomen is soft, nontender, nondistended, no abdominal masses GU: No CVA tenderness.  Lymph: No cervical or inguinal lymphadenopathy. Skin: No rashes, bruises or suspicious lesions. Neurologic: Grossly intact, no focal deficits, moving all 4 extremities. Psychiatric: Normal mood and affect.  Laboratory Data: Lab Results  Component Value Date   WBC 13.6 (H) 07/14/2022   HGB 15.4 07/14/2022   HCT 44.3 07/14/2022   MCV 84.2 07/14/2022   PLT 264 07/14/2022    Lab Results  Component Value Date   CREATININE 0.77 07/14/2022    No results found for: "PSA"  No results found for: "TESTOSTERONE"  Lab Results  Component Value Date   HGBA1C 9.5 (H) 01/16/2022    Urinalysis    Component Value Date/Time   COLORURINE YELLOW 05/15/2021 1809   APPEARANCEUR Clear 01/22/2022 1343   LABSPEC 1.018 05/15/2021 1809   PHURINE 5.0 05/15/2021 1809   GLUCOSEU CANCELED 01/22/2022 1343   HGBUR LARGE (A) 05/15/2021 1809   BILIRUBINUR Negative 01/22/2022 1343   KETONESUR 5 (A) 05/15/2021 1809   PROTEINUR CANCELED 01/22/2022 1343   PROTEINUR 30 (A) 05/15/2021 1809   UROBILINOGEN 0.2 11/24/2013 1715   NITRITE Negative 01/14/2022 1130   NITRITE NEGATIVE 05/15/2021 1809   LEUKOCYTESUR Negative 01/14/2022 1130   LEUKOCYTESUR NEGATIVE 05/15/2021 1809    Lab Results  Component Value Date   LABMICR See below: 01/22/2022   WBCUA 6-10 (A) 01/22/2022   LABEPIT 0-10 01/22/2022   MUCUS Present (A) 12/31/2021   BACTERIA None seen 01/22/2022    Pertinent Imaging:  Results for orders placed during the hospital encounter of 12/31/21  Abdomen 1 view (KUB)  Narrative CLINICAL DATA:  Left flank pain.  Nephrolithiasis.  EXAM: ABDOMEN - 1 VIEW  COMPARISON:  Abdominopelvic CT 11/09/2021.  FINDINGS: Two supine views of the abdomen are submitted. Partial staghorn left renal calculus demonstrated by CT is not well seen radiographically. No new  calcifications are seen along the expected course of the ureters. The visualized bowel gas pattern is normal. The bones appear unremarkable.  IMPRESSION: Known partial staghorn left renal calculus is not well seen radiographically.   Electronically Signed By: Carey Bullocks M.D. On: 12/31/2021 12:40  No results found for this or any previous visit.  No results found for this or any previous visit.  No results found for this or any previous visit.  Results for orders placed during the hospital encounter of 03/29/22  Ultrasound renal complete  Narrative CLINICAL DATA:  Nephrolithiasis follow-up  EXAM: RENAL / URINARY TRACT ULTRASOUND COMPLETE  COMPARISON:  CT scan of the abdomen and pelvis March 06, 2022  FINDINGS: Right Kidney:  Renal measurements: 11.0 x 4.8 x 6.7 cm = volume: 184 mL. Echogenicity within normal limits. No mass or hydronephrosis visualized.  Left Kidney:  Renal measurements: 12.2 x 6.2 x 5.4 cm = volume: 212 mL. A 6 mm echogenic focus without definite  shadowing is identified in the lower pole of the left kidney. No hydronephrosis.  Bladder:  Appears normal for degree of bladder distention.  Other:  None.  IMPRESSION: A 6 mm echogenic focus in the lower pole of the left kidney could represent artifact versus a tiny stone. No stone identified in this location on the comparison CT scan. No hydronephrosis. No other abnormalities.   Electronically Signed By: Gerome Sam III M.D. On: 03/29/2022 14:14  No valid procedures specified. No results found for this or any previous visit.  Results for orders placed in visit on 11/02/21  CT RENAL STONE STUDY  Narrative CLINICAL DATA:  Left flank pain nephrolithiasis.  Hematuria.  EXAM: CT ABDOMEN AND PELVIS WITHOUT CONTRAST  TECHNIQUE: Multidetector CT imaging of the abdomen and pelvis was performed following the standard protocol without IV contrast.  RADIATION DOSE REDUCTION:  This exam was performed according to the departmental dose-optimization program which includes automated exposure control, adjustment of the mA and/or kV according to patient size and/or use of iterative reconstruction technique.  COMPARISON:  Multiple priors including most recent CT May 15, 2021  FINDINGS: Lower chest: No acute abnormality.  Hepatobiliary: Similar marked diffuse hepatic steatosis with focal fatty sparing along the gallbladder fossa. Gallbladder is distended without signs of acute inflammation. No biliary ductal dilation.  Pancreas: No pancreatic ductal dilation or evidence of acute inflammation.  Spleen: No splenomegaly.  Adrenals/Urinary Tract: Bilateral adrenal glands appear normal.  No hydronephrosis. 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 Stranding along the left lower pole renal calices and the renal pelvis. No obstructive ureteral or bladder calculi identified. Urinary bladder is unremarkable for degree of distension.  Stomach/Bowel: No radiopaque enteric contrast material was administered. Stomach is unremarkable for degree of distension. No pathologic dilation of small or large bowel. No evidence of acute bowel inflammation.  Vascular/Lymphatic: Normal caliber abdominal aorta. Similar prominent portacaval and periportal lymph nodes measuring up to 12 mm in short axis on image 37/2 favored reactive in the setting of hepatic steatosis.  Reproductive: Prostate is unremarkable.  Other: No significant abdominopelvic free fluid.  Musculoskeletal: No acute or significant osseous findings.  IMPRESSION: 1. 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 2. No obstructive uropathy. 3. Similar marked diffuse  hepatic steatosis with focal fatty sparing along the gallbladder fossa.   Electronically Signed By: Maudry Mayhew M.D. On: 11/09/2021 16:00   Assessment & Plan:    1. Nephrolithiasis Renal US, will call with results - Urinalysis, Routine w reflex microscopic - Potassium Citrate 15 MEQ (1620 MG) TBCR; Take 1 tablet by mouth 2 (two) times daily.  Dispense: 60 tablet; Refill: 2 - sodium bicarbonate 650 MG tablet; Take 1 tablet (650 mg total) by mouth 2 (two) times daily.  Dispense: 60 tablet; Refill: 2 - Ultrasound renal complete - Ultrasound renal complete; Future   No follow-ups on file.  Wilkie Aye, MD  [facility] [place]

## 2022-09-17 ENCOUNTER — Encounter: Payer: Self-pay | Admitting: Urology

## 2022-09-17 NOTE — Patient Instructions (Signed)

## 2022-09-25 ENCOUNTER — Ambulatory Visit (HOSPITAL_COMMUNITY)
Admission: RE | Admit: 2022-09-25 | Discharge: 2022-09-25 | Disposition: A | Discharge status: Home / Self Care | Payer: Medicaid Other | Source: Ambulatory Visit | Attending: Urology | Admitting: Urology

## 2022-09-25 DIAGNOSIS — N200 Calculus of kidney: Secondary | ICD-10-CM | POA: Insufficient documentation

## 2023-03-05 ENCOUNTER — Ambulatory Visit (HOSPITAL_COMMUNITY): Admission: RE | Admit: 2023-03-05 | Payer: Medicaid Other | Source: Ambulatory Visit

## 2023-03-12 ENCOUNTER — Ambulatory Visit: Payer: Medicaid Other | Admitting: Urology

## 2023-06-30 ENCOUNTER — Emergency Department (HOSPITAL_COMMUNITY)
Admission: EM | Admit: 2023-06-30 | Discharge: 2023-06-30 | Disposition: A | Discharge status: Home / Self Care | Attending: Emergency Medicine | Admitting: Emergency Medicine

## 2023-06-30 ENCOUNTER — Other Ambulatory Visit: Payer: Self-pay

## 2023-06-30 ENCOUNTER — Emergency Department (HOSPITAL_COMMUNITY)

## 2023-06-30 ENCOUNTER — Encounter (HOSPITAL_COMMUNITY): Payer: Self-pay

## 2023-06-30 DIAGNOSIS — N132 Hydronephrosis with renal and ureteral calculous obstruction: Secondary | ICD-10-CM | POA: Diagnosis not present

## 2023-06-30 DIAGNOSIS — R109 Unspecified abdominal pain: Secondary | ICD-10-CM | POA: Diagnosis present

## 2023-06-30 DIAGNOSIS — N201 Calculus of ureter: Secondary | ICD-10-CM

## 2023-06-30 LAB — URINALYSIS, ROUTINE W REFLEX MICROSCOPIC
Bacteria, UA: NONE SEEN
Bilirubin Urine: NEGATIVE
Glucose, UA: >=500 mg/dL — AB
Hgb urine dipstick: NEGATIVE
Ketones, ur: NEGATIVE mg/dL
Leukocytes,Ua: NEGATIVE
Nitrite: NEGATIVE
Protein, ur: NEGATIVE mg/dL
Specific Gravity, Urine: 1.017 (ref 1.005–1.030)
pH: 7 (ref 5.0–8.0)

## 2023-06-30 LAB — COMPREHENSIVE METABOLIC PANEL WITH GFR
ALT: 29 U/L (ref 0–44)
AST: 19 U/L (ref 15–41)
Albumin: 3.3 g/dL — ABNORMAL LOW (ref 3.5–5.0)
Alkaline Phosphatase: 68 U/L (ref 38–126)
Anion gap: 10 (ref 5–15)
BUN: 14 mg/dL (ref 6–20)
CO2: 24 mmol/L (ref 22–32)
Calcium: 8.6 mg/dL — ABNORMAL LOW (ref 8.9–10.3)
Chloride: 98 mmol/L (ref 98–111)
Creatinine, Ser: 1.11 mg/dL (ref 0.61–1.24)
GFR, Estimated: >60 mL/min (ref >60)
Glucose, Bld: 255 mg/dL — ABNORMAL HIGH (ref 70–99)
Potassium: 4 mmol/L (ref 3.5–5.1)
Sodium: 132 mmol/L — ABNORMAL LOW (ref 135–145)
Total Bilirubin: 0.5 mg/dL (ref 0.0–1.2)
Total Protein: 7 g/dL (ref 6.5–8.1)

## 2023-06-30 LAB — CBC WITH DIFFERENTIAL/PLATELET
Abs Immature Granulocytes: 0.02 10*3/uL (ref 0.00–0.07)
Basophils Absolute: 0 10*3/uL (ref 0.0–0.1)
Basophils Relative: 1 %
Eosinophils Absolute: 0.1 10*3/uL (ref 0.0–0.5)
Eosinophils Relative: 1 %
HCT: 41.3 % (ref 39.0–52.0)
Hemoglobin: 14.4 g/dL (ref 13.0–17.0)
Immature Granulocytes: 0 %
Lymphocytes Relative: 22 %
Lymphs Abs: 2 10*3/uL (ref 0.7–4.0)
MCH: 30.3 pg (ref 26.0–34.0)
MCHC: 34.9 g/dL (ref 30.0–36.0)
MCV: 86.9 fL (ref 80.0–100.0)
Monocytes Absolute: 0.9 10*3/uL (ref 0.1–1.0)
Monocytes Relative: 10 %
Neutro Abs: 5.8 10*3/uL (ref 1.7–7.7)
Neutrophils Relative %: 66 %
Platelets: 213 10*3/uL (ref 150–400)
RBC: 4.75 MIL/uL (ref 4.22–5.81)
RDW: 13.1 % (ref 11.5–15.5)
WBC: 8.9 10*3/uL (ref 4.0–10.5)
nRBC: 0 % (ref 0.0–0.2)

## 2023-06-30 LAB — LIPASE, BLOOD: Lipase: 29 U/L (ref 11–51)

## 2023-06-30 MED ORDER — HYDROMORPHONE HCL 1 MG/ML IJ SOLN
1.0000 mg | Freq: Once | INTRAMUSCULAR | Status: DC
  Filled 2023-06-30: qty 1

## 2023-06-30 MED ORDER — KETOROLAC TROMETHAMINE 60 MG/2ML IM SOLN
15.0000 mg | Freq: Once | INTRAMUSCULAR | Status: AC
  Administered 2023-06-30: 15 mg via INTRAMUSCULAR
  Filled 2023-06-30: qty 2

## 2023-06-30 MED ORDER — ONDANSETRON 4 MG PO TBDP: 4.0000 mg | ORAL_TABLET | Freq: Three times a day (TID) | ORAL | 0 refills | Status: AC | PRN

## 2023-06-30 MED ORDER — TAMSULOSIN HCL 0.4 MG PO CAPS: 0.4000 mg | ORAL_CAPSULE | Freq: Every day | ORAL | 0 refills | Status: AC

## 2023-06-30 MED ORDER — ONDANSETRON HCL 4 MG/2ML IJ SOLN
4.0000 mg | Freq: Once | INTRAMUSCULAR | Status: DC
Start: 2023-06-30 — End: 2023-06-30
  Filled 2023-06-30: qty 2

## 2023-06-30 MED ORDER — KETOROLAC TROMETHAMINE 15 MG/ML IJ SOLN
15.0000 mg | Freq: Once | INTRAMUSCULAR | Status: DC
  Filled 2023-06-30: qty 1

## 2023-06-30 MED ORDER — OXYCODONE HCL 5 MG PO TABS: 5.0000 mg | ORAL_TABLET | ORAL | 0 refills | Status: DC | PRN

## 2023-06-30 MED ORDER — ONDANSETRON 4 MG PO TBDP
4.0000 mg | ORAL_TABLET | Freq: Once | ORAL | Status: AC
  Administered 2023-06-30: 4 mg via ORAL
  Filled 2023-06-30: qty 1

## 2023-06-30 MED ORDER — KETOROLAC TROMETHAMINE 10 MG PO TABS: 10.0000 mg | ORAL_TABLET | Freq: Four times a day (QID) | ORAL | 0 refills | Status: DC | PRN

## 2023-06-30 NOTE — ED Provider Notes (Signed)
 [place] [facility]GENCY DEPARTMENT AT [facility] Provider Note   CSN: [id number] Arrival date & time: 06/30/23  1635     History {Add pertinent medical, surgical, social history, OB history to HPI:1} Chief Complaint  Patient presents with   Flank Pain    Chad Weaver. is a 33 y.o. male with PMH as listed below who presents with left flank pain that began a couple weeks ago, but last night the pain intensified. Was intermittent but now constant. L flank/left abdominal pain radiating into left testicle. Pt denies hematuria, dysuria, frequency. +chills with no fever. +nausea with no vomiting. Pt reports Hx of kidney stones. And this feels similar. Has been urinating "grit and sand." Patient pacing around the room grabbing at left flank, pain rated 5/10 currently.    Past Medical History:  Diagnosis Date   GERD (gastroesophageal reflux disease)    Gout    H/O knee surgery    History of kidney stones    Kidney stones        Home Medications Prior to Admission medications   Medication Sig Start Date End Date Taking? Authorizing Provider  alfuzosin  (UROXATRAL ) 10 MG 24 hr tablet Take 1 tablet (10 mg total) by mouth at bedtime. Patient not taking: Reported on 09/13/2022 03/07/22   Marco Severs, MD  allopurinol  (ZYLOPRIM ) 100 MG tablet Take 1 tablet (100 mg total) by mouth daily. 12/21/21   Stoneking, Ponce Brisker., MD  clotrimazole -betamethasone  (LOTRISONE ) cream Apply 1 Application topically 2 (two) times daily. Patient not taking: Reported on 09/13/2022 03/13/22   Marco Severs, MD  oxyCODONE  (OXY IR/ROXICODONE ) 5 MG immediate release tablet Take 1 tablet (5 mg total) by mouth every 6 (six) hours as needed for severe pain. Patient not taking: Reported on 09/13/2022 03/07/22   Marco Severs, MD  Potassium Citrate  15 MEQ (1620 MG) TBCR Take 1 tablet by mouth 2 (two) times daily. 09/13/22   McKenzie, Arden Beck, MD  sodium bicarbonate  650 MG tablet Take 1 tablet  (650 mg total) by mouth 2 (two) times daily. 09/13/22   McKenzie, Arden Beck, MD      Allergies    Sulfonamide derivatives    Review of Systems   Review of Systems A 10 point review of systems was performed and is negative unless otherwise reported in HPI.  Physical Exam Updated Vital Signs BP (!) 131/97 (BP Location: Right Arm)   Pulse 92   Temp 98.1 F (36.7 C)   Resp 20   Ht 5\' 11"  (1.803 m)   Wt (!) 158.8 kg   SpO2 94%   BMI 48.82 kg/m  Physical Exam General: Uncomfortable appearing obese male, pacing the room HEENT: PERRLA, Sclera anicteric, MMM, trachea midline.  Cardiology: RRR, no murmurs/rubs/gallops.  Resp: Normal respiratory rate and effort. CTAB, no wheezes, rhonchi, crackles.  Abd: Soft, +L-sided abdominal TTP, non-distended. No rebound tenderness or guarding.  GU: Deferred. MSK: No peripheral edema or signs of trauma. Extremities without deformity or TTP. No cyanosis or clubbing. Skin: warm, dry.  Back: No CVA tenderness Neuro: A&Ox4, CNs II-XII grossly intact. MAEs. Sensation grossly intact.  Psych: Normal mood and affect.   ED Results / Procedures / Treatments   Labs (all labs ordered are listed, but only abnormal results are displayed) Labs Reviewed  COMPREHENSIVE METABOLIC PANEL WITH GFR - Abnormal; Notable for the following components:      Result Value   Sodium 132 (*)    Glucose, Bld 255 (*)  Calcium 8.6 (*)    Albumin 3.3 (*)    All other components within normal limits  URINALYSIS, ROUTINE W REFLEX MICROSCOPIC - Abnormal; Notable for the following components:   Color, Urine STRAW (*)    Glucose, UA >=500 (*)    All other components within normal limits  CBC WITH DIFFERENTIAL/PLATELET  LIPASE, BLOOD    EKG None  Radiology No results found.  Procedures Procedures  {Document cardiac monitor, telemetry assessment procedure when appropriate:1}  Medications Ordered in ED Medications  HYDROmorphone  (DILAUDID ) injection 1 mg (has no  administration in time range)  ketorolac  (TORADOL ) 15 MG/ML injection 15 mg (has no administration in time range)  ondansetron  (ZOFRAN ) injection 4 mg (has no administration in time range)    ED Course/ Medical Decision Making/ A&P                          Medical Decision Making Amount and/or Complexity of Data Reviewed Labs: ordered. Decision-making details documented in ED Course. Radiology: ordered.  Risk Prescription drug management.    This patient presents to the ED for concern of ***, this involves an extensive number of treatment options, and is a complaint that carries with it a high risk of complications and morbidity.  I considered the following differential and admission for this acute, potentially life threatening condition.   MDM:    DDX for flank pain includes but is not limited to:  Vascular causes such as abdominal aortic aneurysm or dissection, renal artery embolism, renal vein thrombosis, or mesenteric ischemia.   GU causes such as pyelonephritis, nephrolithiasis/ureterolithiasis, perinephric abscess, RCC, obstructive uropathy, renal infarction, ureteral blood clot or stricture, cystitis  GI causes such as biliary colic, pancreatitis, perforated peptic ulcer, appendicitis (appendix may be pushed to RUQ in pregnancy), inguinal Hernia, diverticulitis, malignancy, SBO.  Gynecologic/GU causes such as ectopic pregnancy, PID/TOA, ovarian cyst vs torsion, endometriosis, testicular torsion/epididymitis   Clinical Course as of 06/30/23 2150  Mon Jun 30, 2023  2144 Urinalysis, Routine w reflex microscopic -Urine, Clean Catch(!) No UTI or hematuria [HN]  2144 CBC with Differential wnl [HN]  2145 Lipase: 29 wnl [HN]  2145 Glucose(!): 255 Mild hyperglycemia otherwise Unremarkable in the context of this patient's presentation  [HN]    Clinical Course User Index [HN] Merdis Stalling, MD    Labs: I Ordered, and personally interpreted labs.  The pertinent results  include:  those listed above  Imaging Studies ordered: I ordered imaging studies including CT renal stone I independently visualized and interpreted imaging. I agree with the radiologist interpretation  Additional history obtained from chart review, wife at bedside.  Reevaluation: After the interventions noted above, I reevaluated the patient and found that they have :improved  Social Determinants of Health: Lives independently  Disposition:  ***  Co morbidities that complicate the patient evaluation  Past Medical History:  Diagnosis Date   GERD (gastroesophageal reflux disease)    Gout    H/O knee surgery    History of kidney stones    Kidney stones      Medicines Meds ordered this encounter  Medications   HYDROmorphone  (DILAUDID ) injection 1 mg   ketorolac  (TORADOL ) 15 MG/ML injection 15 mg   ondansetron  (ZOFRAN ) injection 4 mg    I have reviewed the patients home medicines and have made adjustments as needed  Problem List / ED Course: Problem List Items Addressed This Visit   None        {  Document critical care time when appropriate:1} {Document review of labs and clinical decision tools ie heart score, Chads2Vasc2 etc:1}  {Document your independent review of radiology images, and any outside records:1} {Document your discussion with family members, caretakers, and with consultants:1} {Document social determinants of health affecting pt's care:1} {Document your decision making why or why not admission, treatments were needed:1}  This note was created using dictation software, which may contain spelling or grammatical errors.

## 2023-06-30 NOTE — Discharge Instructions (Addendum)
 Thank you for coming to [facility] Emergency Department. You were seen for flank pain. We did an exam, labs, and imaging, and these showed two kidney stones that are obstructing on the left side.   We have prescribed: -Zofran  4 mg under the tongue every 6-8 hours as needed for nausea/vomiting -Toradol  10 mg every 6 hours for pain. Please do not take other NSAIDs including advil , ibuprofen , or naproxen  while taking this medication. -Tamsulosin  0.4 mg every evening to help the stone pass -Oxycodone  5 mg every 4-6 hours as needed for severe pain -You can also take tylenol  1,000 mg every 8 hours for pain. Heat packs can also help.  Please follow up with your urologist Dr. Claretta Croft within 1-2 weeks.   Do not hesitate to return to the ED or call 911 if you experience: -Worsening symptoms -Nausea/vomiting so severe you cannot eat/drink anything or take medications -Lightheadedness, passing out -Fevers/chills -Anything else that concerns you

## 2023-06-30 NOTE — ED Triage Notes (Signed)
 Pt arrived via POV from home c/o left flank pain that began a couple weeks ago, but last night the pain intensified. Pt denies hematuria. Pt reports Hx of kidney stones.

## 2023-08-01 ENCOUNTER — Ambulatory Visit (HOSPITAL_COMMUNITY)
Admission: RE | Admit: 2023-08-01 | Discharge: 2023-08-01 | Disposition: A | Discharge status: Home / Self Care | Source: Ambulatory Visit | Attending: Urology | Admitting: Urology

## 2023-08-01 DIAGNOSIS — N200 Calculus of kidney: Secondary | ICD-10-CM | POA: Diagnosis present

## 2023-08-29 ENCOUNTER — Encounter: Payer: Self-pay | Admitting: Urology

## 2023-08-29 ENCOUNTER — Ambulatory Visit (HOSPITAL_COMMUNITY)
Admission: RE | Admit: 2023-08-29 | Discharge: 2023-08-29 | Disposition: A | Discharge status: Home / Self Care | Source: Ambulatory Visit | Attending: Urology | Admitting: Urology

## 2023-08-29 ENCOUNTER — Ambulatory Visit (INDEPENDENT_AMBULATORY_CARE_PROVIDER_SITE_OTHER): Admitting: Urology

## 2023-08-29 VITALS — BP 140/87 | HR 109

## 2023-08-29 DIAGNOSIS — N200 Calculus of kidney: Secondary | ICD-10-CM | POA: Diagnosis present

## 2023-08-29 DIAGNOSIS — R1032 Left lower quadrant pain: Secondary | ICD-10-CM

## 2023-08-29 DIAGNOSIS — R109 Unspecified abdominal pain: Secondary | ICD-10-CM

## 2023-08-29 DIAGNOSIS — R310 Gross hematuria: Secondary | ICD-10-CM

## 2023-08-29 LAB — URINALYSIS, ROUTINE W REFLEX MICROSCOPIC
Bilirubin, UA: NEGATIVE
Ketones, UA: NEGATIVE
Leukocytes,UA: NEGATIVE
Nitrite, UA: NEGATIVE
Protein,UA: NEGATIVE
Specific Gravity, UA: 1.015 (ref 1.005–1.030)
Urobilinogen, Ur: 0.2 mg/dL (ref 0.2–1.0)
pH, UA: 6 (ref 5.0–7.5)

## 2023-08-29 LAB — MICROSCOPIC EXAMINATION
Bacteria, UA: NONE SEEN
RBC, Urine: >30 /HPF — AB (ref 0–2)

## 2023-08-29 MED ORDER — TAMSULOSIN HCL 0.4 MG PO CAPS: ORAL_CAPSULE | ORAL | 1 refills | Status: AC

## 2023-08-29 MED ORDER — OXYCODONE HCL 5 MG PO TABS: 5.0000 mg | ORAL_TABLET | ORAL | 0 refills | Status: DC | PRN

## 2023-08-29 MED ORDER — KETOROLAC TROMETHAMINE 10 MG PO TABS: 10.0000 mg | ORAL_TABLET | Freq: Four times a day (QID) | ORAL | 0 refills | Status: DC | PRN

## 2023-08-29 NOTE — Progress Notes (Signed)
 Name: Chad Weaver. DOB: [birth year] MRN: [id number]  History of Present Illness: Chad Weaver is a 33 y.o. male who presents today for follow up visit at [facility]. Relevant History includes: 1. Kidney stones, recurrent.  At last visit with Dr. Sherrilee on 09/13/2022: The plan was to restart sodium bicarb and urocit K for stone prevention.  Since last visit: > 06/30/2023:  - Seen in ER for left flank/left abdominal pain radiating into left testicle. - Urine microscopy: unremarkable - Renal function normal (GFR >60; creatinine 1.11). CBC with no leukocytosis (WBC 8.9). CT showed two 4 mm distal left ureteral stones with resultant hydroureteronephrosis. Multiple nonobstructing left renal calculi measuring up to 15 mm. Prescriptions given for Flomax , Zofran , Toradol , and Oxycodone .  > 08/01/2023:  RUS showed multiple left intrarenal stones (largest measuring 25.4 mm) with interval resolution of left hydroureteronephrosis.  Today: He reports denies that over past week or so he's been having significant intermittent left flank pain radiating into his LLQ along with gross hematuria. Also having increased urinary urgency and frequency. Denies dysuria, hesitancy, straining to void, or sensations of incomplete emptying. He denies fevers. Reports intermittent nausea; states he still has an adequate supply of Zofran  for PRN use.   Medications: Current Outpatient Medications  Medication Sig Dispense Refill   tamsulosin  (FLOMAX ) 0.4 MG CAPS capsule Take 1 capsule by mouth daily as needed for stone symptoms. Advised to contact urology provider / request office visit if stone symptoms fail to resolve. Go to ER if symptoms become severe. 30 capsule 1   allopurinol  (ZYLOPRIM ) 100 MG tablet Take 1 tablet (100 mg total) by mouth daily. (Patient not taking: Reported on 08/29/2023) 30 tablet 5   clotrimazole -betamethasone  (LOTRISONE ) cream Apply 1 Application topically 2 (two) times  daily. (Patient not taking: Reported on 08/29/2023) 30 g 3   ketorolac  (TORADOL ) 10 MG tablet Take 1 tablet (10 mg total) by mouth every 6 (six) hours as needed. 20 tablet 0   ondansetron  (ZOFRAN -ODT) 4 MG disintegrating tablet Take 1 tablet (4 mg total) by mouth every 8 (eight) hours as needed for nausea or vomiting. (Patient not taking: Reported on 08/29/2023) 20 tablet 0   oxyCODONE  (ROXICODONE ) 5 MG immediate release tablet Take 1 tablet (5 mg total) by mouth every 4 (four) hours as needed for severe pain (pain score 7-10). 30 tablet 0   Potassium Citrate  15 MEQ (1620 MG) TBCR Take 1 tablet by mouth 2 (two) times daily. (Patient not taking: Reported on 08/29/2023) 60 tablet 2   sodium bicarbonate  650 MG tablet Take 1 tablet (650 mg total) by mouth 2 (two) times daily. (Patient not taking: Reported on 08/29/2023) 60 tablet 2   No current facility-administered medications for this visit.    Allergies: Allergies  Allergen Reactions   Sulfonamide Derivatives Swelling and Rash    Past Medical History:  Diagnosis Date   GERD (gastroesophageal reflux disease)    Gout    H/O knee surgery    History of kidney stones    Kidney stones    Past Surgical History:  Procedure Laterality Date   ADENOIDECTOMY     ANTERIOR CRUCIATE LIGAMENT REPAIR     ANTERIOR CRUCIATE LIGAMENT REPAIR Left    CYSTOSCOPY W/ URETERAL STENT PLACEMENT Left 01/17/2022   Procedure: CYSTOSCOPY WITH RETROGRADE PYELOGRAM/URETERAL STENT PLACEMENT;  Surgeon: Roseann Adine PARAS., MD;  Location: [facility];  Service: Urology;  Laterality: Left;   CYSTOSCOPY WITH RETROGRADE PYELOGRAM, URETEROSCOPY AND STENT PLACEMENT Bilateral  01/17/2022   Procedure: CYSTOSCOPY WITH BILATERAL RETROGRADE PYELOGRAM;  Surgeon: Roseann Adine PARAS., MD;  Location: [facility];  Service: Urology;  Laterality: Bilateral;   CYSTOSCOPY WITH RETROGRADE PYELOGRAM, URETEROSCOPY AND STENT PLACEMENT Left 03/07/2022   Procedure: CYSTOSCOPY WITH RETROGRADE PYELOGRAM,  URETEROSCOPY AND STENT PLACEMENT;  Surgeon: Sherrilee Belvie CROME, MD;  Location: [facility];  Service: Urology;  Laterality: Left;   HOLMIUM LASER APPLICATION Left 03/07/2022   Procedure: HOLMIUM LASER APPLICATION;  Surgeon: Sherrilee Belvie CROME, MD;  Location: [facility];  Service: Urology;  Laterality: Left;   STONE EXTRACTION WITH BASKET Left 03/07/2022   Procedure: STONE EXTRACTION WITH BASKET;  Surgeon: Sherrilee Belvie CROME, MD;  Location: [facility];  Service: Urology;  Laterality: Left;   TYMPANOSTOMY TUBE PLACEMENT     History reviewed. No pertinent family history. Social History   Socioeconomic History   Marital status: Married    Spouse name: Not on file   Number of children: Not on file   Years of education: Not on file   Highest education level: Not on file  Occupational History   Not on file  Tobacco Use   Smoking status: Some Days    Current packs/day: 0.50    Types: Cigarettes    Passive exposure: Current   Smokeless tobacco: Current    Types: Snuff  Vaping Use   Vaping status: Never Used  Substance and Sexual Activity   Alcohol use: No   Drug use: No   Sexual activity: Not on file  Other Topics Concern   Not on file  Social History Narrative   Not on file   Social Drivers of Health   Financial Resource Strain: Not on file  Food Insecurity: Not on file  Transportation Needs: Not on file  Physical Activity: Not on file  Stress: Not on file  Social Connections: Not on file  Intimate Partner Violence: Not on file    SUBJECTIVE  Review of Systems Constitutional: Patient denies any unintentional weight loss or change in strength lntegumentary: Patient denies any rashes or pruritus Cardiovascular: Patient denies chest pain or syncope Respiratory: Patient denies shortness of breath Gastrointestinal: As per HPI Musculoskeletal: Patient denies muscle cramps or weakness Neurologic: Patient denies convulsions or seizures Allergic/Immunologic: Patient denies recent allergic  reaction(s) Hematologic/Lymphatic: Patient denies bleeding tendencies Endocrine: Patient denies heat/cold intolerance  GU: As per HPI.  OBJECTIVE Vitals:   08/29/23 1154  BP: (!) 140/87  Pulse: (!) 109   There is no height or weight on file to calculate BMI.  Physical Examination Constitutional: No obvious distress; patient is non-toxic appearing  Cardiovascular: No visible lower extremity edema.  Respiratory: The patient does not have audible wheezing/stridor; respirations do not appear labored  Gastrointestinal: Abdomen non-distended Musculoskeletal: Normal ROM of UEs  Skin: No obvious rashes/open sores  Neurologic: CN 2-12 grossly intact Psychiatric: Answered questions appropriately with normal affect  Hematologic/Lymphatic/Immunologic: No obvious bruises or sites of spontaneous bleeding  Urine microscopy: >30 RBC/hpf, otherwise unremarkable   ASSESSMENT Nephrolithiasis - Plan: Urinalysis, Routine w reflex microscopic, tamsulosin  (FLOMAX ) 0.4 MG CAPS capsule, DG Abd 1 View, oxyCODONE  (ROXICODONE ) 5 MG immediate release tablet, ketorolac  (TORADOL ) 10 MG tablet  Left flank pain - Plan: tamsulosin  (FLOMAX ) 0.4 MG CAPS capsule, DG Abd 1 View, oxyCODONE  (ROXICODONE ) 5 MG immediate release tablet, ketorolac  (TORADOL ) 10 MG tablet  LLQ pain - Plan: tamsulosin  (FLOMAX ) 0.4 MG CAPS capsule, DG Abd 1 View, oxyCODONE  (ROXICODONE ) 5 MG immediate release tablet, ketorolac  (TORADOL ) 10 MG tablet  Gross hematuria  We reviewed recent history and imaging.   For acute GU stone symptoms we agreed to proceed with: -  KUB today - Flomax  0.4 mg daily for medical expulsive therapy (MET) - For pain management, we discussed the use of OTC analgesics versus opioids  - For nausea / vomiting: Zofran  as prescribed at ER   We discussed the various treatment options including  - medical expulsive therapy (MET) - extracorporeal shock wave lithotripsy (ESWL) - ureteroscopic stone manipulation  (URS) We discussed possible risks and benefits of intervention including but not limited to: including pain, infection, sepsis, UTI, ureter perforation, need for stenting, post-op ureteral stricture, hematuria, possible need for follow up procedures.  Will notify patient of recommendations following review of KUB imaging results.  He was advised to contact urology provider or go to the ER if He develops fever >101F, uncontrollable pain, or other significantly concerning symptoms prior to follow up.  He verbalized understanding and agreement. All questions were answered.   PLAN Advised the following: KUB today. Flomax  daily. Analgesics PRN for pain. Zofran  PRN for nausea. Return for follow up to be determined based on results.   Orders Placed This Encounter  Procedures   DG Abd 1 View    Standing Status:   Future    Expected Date:   08/29/2023    Expiration Date:   08/28/2024    Reason for Exam (SYMPTOM  OR DIAGNOSIS REQUIRED):   kidney stone    Preferred imaging location?:   [facility]   Urinalysis, Routine w reflex microscopic   Total time spent caring for the patient today was over 30 minutes. This includes time spent on the date of the visit reviewing the patient's chart before the visit, time spent during the visit, and time spent after the visit on documentation. Over 50% of that time was spent in face-to-face time with this patient for direct counseling. E&M based on time and complexity of medical decision making.  It has been explained that the patient is to follow regularly with their PCP in addition to all other providers involved in their care and to follow instructions provided by these respective offices. Patient advised to contact urology clinic if any urologic-pertaining questions, concerns, new symptoms or problems arise in the interim period.  Patient Instructions  >80% of stones are calcium oxalate. This type of stones forms when body either isn't clearing  oxalate well enough, is making too much oxalate, or too little citrate. This results in oxalate binding to form crystals, which continue to aggregate and form stones.  Limiting calcium does not help, but limiting oxalate in the diet can help. Increasing citric acid intake may also help.  The following measures may help to prevent the recurrence of stones: Increase water  intake to 2-2.5 liters per day May add citrus juice (lemon, lime or orange juice) to water  Moderation in dairy foods Decrease in salt content 5. Low Oxalate diet: Oxylates are found in foods like Tomato, Spinach, red wine and chocolate (see additional resources below).  Internet resources for information regarding low oxalate diet: https://kidneystones.yangchunwu.com https://my.VerticalStretch.be  Foods Low in Sodium or Oxalate Foods You Can Eat  Drinks Coffee, fruit and veggie juice (using the recommended veggies), fruit punch  Fruits Apples, apricots (fresh or canned), avocado, bananas, cherries (sweet), cranberries, grapefruit, red or green grapes, lemon and lime juice, melons, nectarines, papayas, peaches, pears, pineapples, oranges, strawberries (fresh), tangerines  Veggies Artichokes, asparagus, bamboo shoots, broccoli, brussels sprouts, cabbage, cauliflower, chayote squash, chicory, corn, cucumbers,  endive, lettuce, lima beans, mushrooms, onions, peas, peppers, potatoes, radishes, rutabagas, zucchini  Breads, Cereals, Grains Egg noodles, rye bread, cooked and dry cereals without nuts or bran, crackers with unsalted tops, white or wild rice  Meat, Meat Replacements, Fish, Recruitment consultant, fish, poultry, eggs, egg whites, egg replacements  Soup Homemade soup (using the recommended veggies and meat), low-sodium bouillon, low-sodium canned  Desserts Cookies, cakes, ice cream, pudding without chocolate or nuts, candy without chocolate  or nuts  Fats and Oils Butter, margarine, cream, oil, salad dressing, mayo  Other Foods Unsalted potato chips or pretzels, herbs (like garlic, garlic powder, onion powder), lemon juice, salt-free seasoning blends, vinegar  Other Foods Low in Oxalate Foods You Can Eat  Drinks Beer, cola, wine, buttermilk, lemonade or limeade (without added vitamin C), milk  Meat, Meat Replacements, Fish, [employer] meat, ham, bacon, hot dogs, bratwurst, sausage, chicken nuggets, cheddar cheese, canned fish and shellfish  Soup Tomato soup, cheese soup  Other Foods Coconuts, lemon or lime juices, sugar or sweeteners, jellies or jams (from the recommended list)   Moderate-Oxalate Foods Foods to Limit   Drinks Fruit and veggie juices (from the list below), chocolate milk, rice milk, hot cocoa, tea   Fruits Blackberries, blueberries, black currants, cherries (sour), fruit cocktail, mangoes, orange peel, prunes, purple plums   Veggies Baked beans, carrots, celery, green beans, parsnips, summer squash, tomatoes, turnips   Breads, Cereals, Grains White bread, cornbread or cornmeal, white English muffins, saltine or soda crackers, brown rice, vanilla wafers, spaghetti and other noodles, firm tofu, bagels, oatmeal   Meat/meat replacements, fish, poultry Sardines   Desserts Chocolate cake   Fats and Oils Macadamia nuts, pistachio nuts, English walnuts   Other Foods Jams or jellies (made with the fruits above), pepper    High-Oxalate Foods Foods to Avoid Drinks Chocolate drink mixes, soy milk, Ovaltine, instant iced tea, fruit juices of fruits listed below Fruits Apricots (dried), red currants, figs, kiwi, plums, rhubarb Veggies Beans (wax, dried), beets and beet greens, chives, collard greens, eggplant, escarole, dark greens of all kinds, leeks, okra, parsley, rutabagas, spinach, Swiss chard, tomato paste, watercress Breads, Cereals, Grains Amaranth, barley, white corn flour, fried potatoes, fruitcake, grits,  soybean products, sweet potatoes, wheat germ and bran, buckwheat flour, All Bran cereal, graham crackers, pretzels, whole wheat bread Meat/meat replacements, fish, poultry  Dried beans, peanut butter, soy burgers, miso  Desserts Carob, chocolate, marmalades Fats and Oils Nuts (peanuts, almonds, pecans, cashews, hazelnuts), nut butters, sesame seeds, tahini paste Other Foods Poppy seeds   Electronically signed by:  Lauraine KYM Oz, MSN, FNP-C, CUNP 08/29/2023 12:23 PM

## 2023-08-29 NOTE — Patient Instructions (Signed)

## 2023-09-01 ENCOUNTER — Ambulatory Visit: Payer: Self-pay | Admitting: Urology

## 2023-09-17 ENCOUNTER — Telehealth: Payer: Self-pay | Admitting: Urology

## 2023-09-17 DIAGNOSIS — N200 Calculus of kidney: Secondary | ICD-10-CM

## 2023-09-17 NOTE — Telephone Encounter (Signed)
 Had Xray and was supposed to receive a call and he has not heard from anyone

## 2023-09-19 ENCOUNTER — Other Ambulatory Visit: Payer: Self-pay | Admitting: Urology

## 2023-09-19 DIAGNOSIS — N200 Calculus of kidney: Secondary | ICD-10-CM

## 2023-09-19 DIAGNOSIS — R109 Unspecified abdominal pain: Secondary | ICD-10-CM

## 2023-09-24 ENCOUNTER — Ambulatory Visit (HOSPITAL_COMMUNITY)

## 2023-09-26 ENCOUNTER — Ambulatory Visit (HOSPITAL_COMMUNITY)
Admission: RE | Admit: 2023-09-26 | Discharge: 2023-09-26 | Disposition: A | Discharge status: Home / Self Care | Source: Ambulatory Visit | Attending: Urology | Admitting: Urology

## 2023-09-26 DIAGNOSIS — R109 Unspecified abdominal pain: Secondary | ICD-10-CM | POA: Diagnosis present

## 2023-09-26 DIAGNOSIS — N200 Calculus of kidney: Secondary | ICD-10-CM | POA: Diagnosis present

## 2023-10-03 NOTE — Addendum Note (Signed)
 Addended byBETHA MALACHY SLICE on: 10/03/2023 10:47 AM   Modules accepted: Orders

## 2023-10-28 ENCOUNTER — Telehealth: Payer: Self-pay

## 2023-10-28 DIAGNOSIS — N200 Calculus of kidney: Secondary | ICD-10-CM

## 2023-10-28 MED ORDER — POTASSIUM CITRATE ER 15 MEQ (1620 MG) PO TBCR: 1.0000 | EXTENDED_RELEASE_TABLET | Freq: Two times a day (BID) | ORAL | 2 refills | Status: AC

## 2023-10-28 MED ORDER — SODIUM BICARBONATE 650 MG PO TABS: 650.0000 mg | ORAL_TABLET | Freq: Two times a day (BID) | ORAL | 2 refills | Status: AC

## 2023-10-28 NOTE — Telephone Encounter (Signed)
 Pt want to know if he is supposed to take sodium bicarbonate  .and Potassium Citrate . Pt is aware a message will be sen to the provider.

## 2023-10-29 ENCOUNTER — Other Ambulatory Visit: Payer: Self-pay

## 2023-10-29 NOTE — Telephone Encounter (Signed)
 Pt is made aware Rx Potassium citrate  and sodium bicarbonate  sent to pharmacy. Patient verbalized he pick up medication today.

## 2023-12-10 ENCOUNTER — Emergency Department (HOSPITAL_COMMUNITY): Admission: EM | Admit: 2023-12-10 | Discharge: 2023-12-10 | Disposition: A | Discharge status: Home / Self Care

## 2023-12-10 ENCOUNTER — Emergency Department (HOSPITAL_COMMUNITY)

## 2023-12-10 ENCOUNTER — Other Ambulatory Visit: Payer: Self-pay

## 2023-12-10 ENCOUNTER — Encounter (HOSPITAL_COMMUNITY): Payer: Self-pay

## 2023-12-10 DIAGNOSIS — N132 Hydronephrosis with renal and ureteral calculous obstruction: Secondary | ICD-10-CM | POA: Insufficient documentation

## 2023-12-10 DIAGNOSIS — R310 Gross hematuria: Secondary | ICD-10-CM | POA: Diagnosis present

## 2023-12-10 DIAGNOSIS — N200 Calculus of kidney: Secondary | ICD-10-CM

## 2023-12-10 LAB — TYPE AND SCREEN
ABO/RH(D): A POS
Antibody Screen: NEGATIVE

## 2023-12-10 LAB — COMPREHENSIVE METABOLIC PANEL WITH GFR
ALT: 31 U/L (ref 0–44)
AST: 21 U/L (ref 15–41)
Albumin: 3.8 g/dL (ref 3.5–5.0)
Alkaline Phosphatase: 74 U/L (ref 38–126)
Anion gap: 12 (ref 5–15)
BUN: 12 mg/dL (ref 6–20)
CO2: 25 mmol/L (ref 22–32)
Calcium: 8.7 mg/dL — ABNORMAL LOW (ref 8.9–10.3)
Chloride: 104 mmol/L (ref 98–111)
Creatinine, Ser: 0.83 mg/dL (ref 0.61–1.24)
GFR, Estimated: >60 mL/min (ref >60)
Glucose, Bld: 208 mg/dL — ABNORMAL HIGH (ref 70–99)
Potassium: 4 mmol/L (ref 3.5–5.1)
Sodium: 141 mmol/L (ref 135–145)
Total Bilirubin: <0.2 mg/dL (ref 0.0–1.2)
Total Protein: 6.6 g/dL (ref 6.5–8.1)

## 2023-12-10 LAB — CBC WITH DIFFERENTIAL/PLATELET
Abs Immature Granulocytes: 0.02 K/uL (ref 0.00–0.07)
Basophils Absolute: 0.1 K/uL (ref 0.0–0.1)
Basophils Relative: 1 %
Eosinophils Absolute: 0.2 K/uL (ref 0.0–0.5)
Eosinophils Relative: 2 %
HCT: 41.6 % (ref 39.0–52.0)
Hemoglobin: 13.9 g/dL (ref 13.0–17.0)
Immature Granulocytes: 0 %
Lymphocytes Relative: 33 %
Lymphs Abs: 2.6 K/uL (ref 0.7–4.0)
MCH: 30.1 pg (ref 26.0–34.0)
MCHC: 33.4 g/dL (ref 30.0–36.0)
MCV: 90 fL (ref 80.0–100.0)
Monocytes Absolute: 0.7 K/uL (ref 0.1–1.0)
Monocytes Relative: 9 %
Neutro Abs: 4.5 K/uL (ref 1.7–7.7)
Neutrophils Relative %: 55 %
Platelets: 259 K/uL (ref 150–400)
RBC: 4.62 MIL/uL (ref 4.22–5.81)
RDW: 13.2 % (ref 11.5–15.5)
WBC: 8.1 K/uL (ref 4.0–10.5)
nRBC: 0 % (ref 0.0–0.2)

## 2023-12-10 LAB — URINALYSIS, ROUTINE W REFLEX MICROSCOPIC
Bilirubin Urine: NEGATIVE
Glucose, UA: >=500 mg/dL — AB
Ketones, ur: NEGATIVE mg/dL
Nitrite: NEGATIVE
Protein, ur: NEGATIVE mg/dL
Specific Gravity, Urine: 1.01 (ref 1.005–1.030)
pH: 6 (ref 5.0–8.0)

## 2023-12-10 MED ORDER — KETOROLAC TROMETHAMINE 30 MG/ML IJ SOLN
30.0000 mg | Freq: Once | INTRAMUSCULAR | Status: AC
  Administered 2023-12-10: 30 mg via INTRAMUSCULAR
  Filled 2023-12-10: qty 1

## 2023-12-10 MED ORDER — KETOROLAC TROMETHAMINE 30 MG/ML IJ SOLN
15.0000 mg | Freq: Once | INTRAMUSCULAR | Status: DC
  Filled 2023-12-10: qty 1

## 2023-12-10 MED ORDER — ONDANSETRON HCL 4 MG/2ML IJ SOLN
4.0000 mg | Freq: Once | INTRAMUSCULAR | Status: DC
  Filled 2023-12-10: qty 2

## 2023-12-10 MED ORDER — HYDROMORPHONE HCL 1 MG/ML IJ SOLN
1.0000 mg | Freq: Once | INTRAMUSCULAR | Status: AC
  Administered 2023-12-10: 1 mg via INTRAMUSCULAR
  Filled 2023-12-10: qty 1

## 2023-12-10 MED ORDER — ONDANSETRON 4 MG PO TBDP
4.0000 mg | ORAL_TABLET | Freq: Once | ORAL | Status: AC
  Administered 2023-12-10: 4 mg via ORAL
  Filled 2023-12-10: qty 1

## 2023-12-10 MED ORDER — OXYCODONE-ACETAMINOPHEN 5-325 MG PO TABS: 1.0000 | ORAL_TABLET | Freq: Four times a day (QID) | ORAL | 0 refills | Status: DC | PRN

## 2023-12-10 MED ORDER — OXYCODONE HCL 5 MG PO TABS: 5.0000 mg | ORAL_TABLET | ORAL | 0 refills | Status: AC | PRN

## 2023-12-10 NOTE — Discharge Instructions (Signed)
 Continue to stay hydrated, use the pain medication as needed for pain relief.  Call Dr. Sherrilee tomorrow morning for an office visit this week.

## 2023-12-10 NOTE — ED Triage Notes (Signed)
 Pt arrived via POV c/o left flank pain since Friday and reports Hx of kidney stones. Pt presents today due to new onset of hematuria and reports seeing clots in his urine as well.

## 2023-12-10 NOTE — ED Provider Notes (Signed)
 [place] [facility]PARTMENT AT [facility] Provider Note   CSN: [id number] Arrival date & time: 12/10/23  1546     Patient presents with: Hematuria   Chad Weaver. is a 33 y.o. male With a history of kidney stones, gout, GERD presenting for evaluation of left flank pain which started 5 days ago, patient states he passes frequent kidney stones.  Today he developed new onset of hematuria including passage of blood clots which concerns him.  He denies fevers or chills, nausea or vomiting.  He has found no alleviators or aggravators for his symptoms.   The history is provided by the patient.       Prior to Admission medications   Medication Sig Start Date End Date Taking? Authorizing Provider  oxyCODONE  (OXY IR/ROXICODONE ) 5 MG immediate release tablet Take 1 tablet (5 mg total) by mouth every 4 (four) hours as needed for severe pain (pain score 7-10). 12/10/23  Yes Kenyada Hy, PA-C  oxyCODONE -acetaminophen  (PERCOCET/ROXICET) 5-325 MG tablet Take 1 tablet by mouth every 6 (six) hours as needed for severe pain (pain score 7-10). 12/10/23  Yes Stevana Dufner, Mliss, PA-C  allopurinol  (ZYLOPRIM ) 100 MG tablet Take 1 tablet (100 mg total) by mouth daily. Patient not taking: Reported on 08/29/2023 12/21/21   Roseann Adine PARAS., MD  clotrimazole -betamethasone  (LOTRISONE ) cream Apply 1 Application topically 2 (two) times daily. Patient not taking: Reported on 08/29/2023 03/13/22   Sherrilee Belvie CROME, MD  ketorolac  (TORADOL ) 10 MG tablet Take 1 tablet (10 mg total) by mouth every 6 (six) hours as needed. 08/29/23   Gerldine Lauraine BROCKS, FNP  ondansetron  (ZOFRAN -ODT) 4 MG disintegrating tablet Take 1 tablet (4 mg total) by mouth every 8 (eight) hours as needed for nausea or vomiting. Patient not taking: Reported on 08/29/2023 06/30/23   Franklyn Sid SAILOR, MD  Potassium Citrate  15 MEQ (1620 MG) TBCR Take 1 tablet by mouth 2 (two) times daily. 10/28/23   Matilda Senior, MD  sodium bicarbonate  650  MG tablet Take 1 tablet (650 mg total) by mouth 2 (two) times daily. 10/28/23   Matilda Senior, MD  tamsulosin  (FLOMAX ) 0.4 MG CAPS capsule Take 1 capsule by mouth daily as needed for stone symptoms. Advised to contact urology provider / request office visit if stone symptoms fail to resolve. Go to ER if symptoms become severe. 08/29/23   Larocco, Sarah C, FNP    Allergies: Sulfonamide derivatives    Review of Systems  Constitutional:  Negative for fever.  HENT:  Negative for congestion and sore throat.   Eyes: Negative.   Respiratory:  Negative for chest tightness and shortness of breath.   Cardiovascular:  Negative for chest pain.  Gastrointestinal:  Negative for abdominal pain and nausea.  Genitourinary:  Positive for flank pain and hematuria.  Musculoskeletal:  Negative for arthralgias, joint swelling and neck pain.  Skin: Negative.  Negative for rash and wound.  Neurological:  Negative for dizziness, weakness, light-headedness, numbness and headaches.  Psychiatric/Behavioral: Negative.      Updated Vital Signs BP (!) 152/83 (BP Location: Right Arm)   Pulse 85   Temp 98.3 F (36.8 C) (Oral)   Resp 18   Ht 5' 11 (1.803 m)   Wt (!) 158.8 kg   SpO2 96%   BMI 48.83 kg/m   Physical Exam Vitals and nursing note reviewed.  Constitutional:      Appearance: He is well-developed.  HENT:     Head: Normocephalic and atraumatic.  Cardiovascular:  Rate and Rhythm: Normal rate.  Pulmonary:     Effort: Pulmonary effort is normal.  Abdominal:     General: Bowel sounds are normal.     Palpations: Abdomen is soft.     Tenderness: There is no abdominal tenderness. There is no right CVA tenderness or left CVA tenderness.  Musculoskeletal:        General: Normal range of motion.     Cervical back: Normal range of motion.  Skin:    General: Skin is warm and dry.  Neurological:     Mental Status: He is alert and oriented to person, place, and time.     (all labs ordered are  listed, but only abnormal results are displayed) Labs Reviewed  URINALYSIS, ROUTINE W REFLEX MICROSCOPIC - Abnormal; Notable for the following components:      Result Value   Color, Urine STRAW (*)    APPearance HAZY (*)    Glucose, UA >=500 (*)    Hgb urine dipstick MODERATE (*)    Leukocytes,Ua TRACE (*)    Bacteria, UA RARE (*)    All other components within normal limits  COMPREHENSIVE METABOLIC PANEL WITH GFR - Abnormal; Notable for the following components:   Glucose, Bld 208 (*)    Calcium 8.7 (*)    All other components within normal limits  CBC WITH DIFFERENTIAL/PLATELET  TYPE AND SCREEN    EKG: None  Radiology: CT Renal Stone Study Result Date: 12/10/2023 CLINICAL DATA:  Left flank pain since Friday, hematuria EXAM: CT ABDOMEN AND PELVIS WITHOUT CONTRAST TECHNIQUE: Multidetector CT imaging of the abdomen and pelvis was performed following the standard protocol without IV contrast. RADIATION DOSE REDUCTION: This exam was performed according to the departmental dose-optimization program which includes automated exposure control, adjustment of the mA and/or kV according to patient size and/or use of iterative reconstruction technique. COMPARISON:  09/26/2023 FINDINGS: Lower chest: No acute pleural or parenchymal lung disease. Hepatobiliary: Stable hepatic steatosis with areas of fatty sparing near the gallbladder fossa. Gallbladder is unremarkable. No biliary duct dilation. Pancreas: Unremarkable unenhanced appearance. Spleen: Unremarkable unenhanced appearance. Adrenals/Urinary Tract: Multiple nonobstructing left renal calculi are again identified, largest measuring up to 27 mm. Since the prior exam, mild left hydronephrosis and hydroureter as developed. No evidence of obstructing radiopaque calculus. Findings could reflect obstruction from downstream blood clot given reported history of hematuria. Further evaluation with cystoscopy and ureteroscopy could be considered. The right  kidney is unremarkable. The adrenals and bladder appear normal. Stomach/Bowel: No bowel obstruction or ileus. No bowel wall thickening or inflammatory change. Vascular/Lymphatic: No significant vascular findings are present. No enlarged abdominal or pelvic lymph nodes. Reproductive: Prostate is unremarkable. Other: No free fluid or free intraperitoneal gas. No abdominal wall hernia. Musculoskeletal: No acute or destructive bony abnormalities. Reconstructed images demonstrate no additional findings. IMPRESSION: 1. Multiple nonobstructing left renal calculi, not appreciably changed in size or position since prior study. 2. Interval development of mild left-sided hydronephrosis and hydroureter, with no evidence of obstructing downstream radiopaque calculus. Findings could reflect obstruction from blood clot given history of hematuria. If further evaluation is desired, cystoscopy/ureteroscopy could be considered. 3. Stable hepatic steatosis. Electronically Signed   By: Ozell Daring M.D.   On: 12/10/2023 17:23     Procedures   Medications Ordered in the ED  ondansetron  (ZOFRAN -ODT) disintegrating tablet 4 mg (4 mg Oral Given 12/10/23 1959)  ketorolac  (TORADOL ) 30 MG/ML injection 30 mg (30 mg Intramuscular Given 12/10/23 1959)  HYDROmorphone  (DILAUDID ) injection 1 mg (  1 mg Intramuscular Given 12/10/23 2151)                                    Medical Decision Making Presenting with left flank pain and hematuria, has been passing small stones/grit x 5 days, history of kidney stones.  Patient is afebrile, has passed multiple blood clots but is able to empty his bladder.  Differential diagnosis including the obvious ureteral stone, pyelonephritis/UTI.  Labs and imaging as outlined below.  Patient was given Toradol  then Dilaudid  for pain control, he is prescribed oxycodone , he already has Flomax  which he has been taking since his symptoms began, he was encouraged to continue this medication.  He was advised  close follow-up with Dr. Sherrilee and he will call in the morning to move up his appointment to be seen this week.  He was given return precautions for any urinary retention, fevers, uncontrolled pain or vomiting.  Amount and/or Complexity of Data Reviewed Labs: ordered.    Details: Labs reviewed, he has a glucose of 208, patient is a known diabetic, normal WBC count at 8.1 and hemoglobin of 13.9 he has moderate hemoglobin in his urine, no UTI. Radiology: ordered.    Details: CT reviewed, left renal calculi, no ureteral stones mild left-sided hydronephrosis and hydro ureter. Discussion of management or test interpretation with external provider(s): Discussed with Dr. Renda, he needs close office follow-up, no indication for admission at this time.  Risk Prescription drug management.        Final diagnoses:  Kidney stones  Gross hematuria    ED Discharge Orders          Ordered    oxyCODONE  (OXY IR/ROXICODONE ) 5 MG immediate release tablet  Every 4 hours PRN        12/10/23 2121    oxyCODONE -acetaminophen  (PERCOCET/ROXICET) 5-325 MG tablet  Every 6 hours PRN        12/10/23 2133               Ibn Stief, PA-C 12/10/23 2158    Kammerer, Megan L, DO 12/11/23 1824

## 2023-12-11 ENCOUNTER — Telehealth: Payer: Self-pay

## 2023-12-11 ENCOUNTER — Ambulatory Visit (HOSPITAL_COMMUNITY)
Admission: RE | Admit: 2023-12-11 | Discharge: 2023-12-11 | Disposition: A | Discharge status: Home / Self Care | Source: Ambulatory Visit | Attending: Urology | Admitting: Urology

## 2023-12-11 DIAGNOSIS — N200 Calculus of kidney: Secondary | ICD-10-CM

## 2023-12-11 MED FILL — Oxycodone w/ Acetaminophen Tab 5-325 MG: ORAL | Qty: 6 | Status: AC

## 2023-12-11 NOTE — Telephone Encounter (Signed)
 Patient called stating he went to ER yesterday concerning kidney stones. CT renal stone study noted. Patient advised to go get KUB and call office back once it is completed. Once MD reviews imaging someone will reach back out to patient to determine next course of action. Patient voiced understanding.

## 2023-12-11 NOTE — Telephone Encounter (Signed)
 Patient called to let yo know he got kub

## 2023-12-12 NOTE — Telephone Encounter (Signed)
 Patient made aware per Dr. Sherrilee he will have his surgery scheduler reach out to  schedule kidney stone surgery.

## 2023-12-15 NOTE — Addendum Note (Signed)
 Addended by: ANN VELERIA SAUNDERS on: 12/15/2023 08:20 AM   Modules accepted: Orders

## 2023-12-16 ENCOUNTER — Other Ambulatory Visit: Payer: Self-pay

## 2023-12-16 ENCOUNTER — Other Ambulatory Visit: Payer: Self-pay | Admitting: Urology

## 2023-12-16 DIAGNOSIS — N200 Calculus of kidney: Secondary | ICD-10-CM

## 2023-12-19 NOTE — Telephone Encounter (Signed)
 Can you confirm patient will be out of work for PCNL surgery until his last post op appt?

## 2023-12-23 ENCOUNTER — Other Ambulatory Visit

## 2023-12-23 DIAGNOSIS — N200 Calculus of kidney: Secondary | ICD-10-CM

## 2023-12-23 LAB — MICROSCOPIC EXAMINATION
Bacteria, UA: NONE SEEN
RBC, Urine: >30 /HPF — AB (ref 0–2)

## 2023-12-23 LAB — URINALYSIS, ROUTINE W REFLEX MICROSCOPIC
Bilirubin, UA: NEGATIVE
Ketones, UA: NEGATIVE
Nitrite, UA: NEGATIVE
Protein,UA: NEGATIVE
Specific Gravity, UA: 1.025 (ref 1.005–1.030)
Urobilinogen, Ur: 0.2 mg/dL (ref 0.2–1.0)
pH, UA: 5.5 (ref 5.0–7.5)

## 2023-12-24 ENCOUNTER — Telehealth: Payer: Self-pay

## 2023-12-24 NOTE — Telephone Encounter (Signed)
 Patient presents today with   Pre-op.  UA done today.  Dr. Sherrilee reviewed results and fine .  Patient aware of MD recommendations.      [id number], CMA

## 2023-12-30 ENCOUNTER — Encounter (HOSPITAL_COMMUNITY): Payer: Self-pay

## 2023-12-30 ENCOUNTER — Encounter (HOSPITAL_COMMUNITY)
Admission: RE | Admit: 2023-12-30 | Discharge: 2023-12-30 | Disposition: A | Discharge status: Home / Self Care | Source: Ambulatory Visit | Attending: Urology | Admitting: Urology

## 2023-12-30 ENCOUNTER — Other Ambulatory Visit: Payer: Self-pay

## 2023-12-31 ENCOUNTER — Ambulatory Visit (HOSPITAL_COMMUNITY)
Admission: RE | Admit: 2023-12-31 | Discharge: 2023-12-31 | Disposition: A | Discharge status: Home / Self Care | Source: Ambulatory Visit | Attending: Urology | Admitting: Urology

## 2023-12-31 ENCOUNTER — Telehealth: Payer: Self-pay

## 2023-12-31 ENCOUNTER — Encounter (HOSPITAL_COMMUNITY): Payer: Self-pay

## 2023-12-31 VITALS — BP 99/74 | HR 90 | Temp 97.6°F | Resp 16

## 2023-12-31 DIAGNOSIS — F17220 Nicotine dependence, chewing tobacco, uncomplicated: Secondary | ICD-10-CM | POA: Diagnosis not present

## 2023-12-31 DIAGNOSIS — R10A2 Flank pain, left side: Secondary | ICD-10-CM

## 2023-12-31 DIAGNOSIS — N200 Calculus of kidney: Secondary | ICD-10-CM | POA: Diagnosis present

## 2023-12-31 DIAGNOSIS — R1032 Left lower quadrant pain: Secondary | ICD-10-CM

## 2023-12-31 LAB — GLUCOSE, CAPILLARY: Glucose-Capillary: 202 mg/dL — ABNORMAL HIGH (ref 70–99)

## 2023-12-31 LAB — CBC
HCT: 44.6 % (ref 39.0–52.0)
Hemoglobin: 15.1 g/dL (ref 13.0–17.0)
MCH: 30.2 pg (ref 26.0–34.0)
MCHC: 33.9 g/dL (ref 30.0–36.0)
MCV: 89.2 fL (ref 80.0–100.0)
Platelets: 269 K/uL (ref 150–400)
RBC: 5 MIL/uL (ref 4.22–5.81)
RDW: 13 % (ref 11.5–15.5)
WBC: 8.5 K/uL (ref 4.0–10.5)
nRBC: 0 % (ref 0.0–0.2)

## 2023-12-31 LAB — PROTIME-INR
INR: 0.9 (ref 0.8–1.2)
Prothrombin Time: 13.1 s (ref 11.4–15.2)

## 2023-12-31 MED ORDER — DIPHENHYDRAMINE HCL 50 MG/ML IJ SOLN
INTRAMUSCULAR | Status: AC | PRN
  Administered 2023-12-31: 50 mg via INTRAVENOUS

## 2023-12-31 MED ORDER — IOHEXOL 300 MG/ML  SOLN
50.0000 mL | Freq: Once | INTRAMUSCULAR | Status: AC | PRN
  Administered 2023-12-31: 15 mL

## 2023-12-31 MED ORDER — SODIUM CHLORIDE 0.9 % IV SOLN: INTRAVENOUS | Status: DC

## 2023-12-31 MED ORDER — MIDAZOLAM HCL (PF) 2 MG/2ML IJ SOLN
INTRAMUSCULAR | Status: AC | PRN
  Administered 2023-12-31: 1 mg via INTRAVENOUS

## 2023-12-31 MED ORDER — SODIUM CHLORIDE 0.9 % IV SOLN
2.0000 g | INTRAVENOUS | Status: AC
  Administered 2023-12-31: 2 g via INTRAVENOUS

## 2023-12-31 MED ORDER — OXYCODONE HCL 5 MG PO TABS
ORAL_TABLET | ORAL | Status: AC
  Filled 2023-12-31: qty 1

## 2023-12-31 MED ORDER — FENTANYL CITRATE (PF) 100 MCG/2ML IJ SOLN
INTRAMUSCULAR | Status: AC | PRN
  Administered 2023-12-31: 50 ug via INTRAVENOUS

## 2023-12-31 MED ORDER — MIDAZOLAM HCL (PF) 2 MG/2ML IJ SOLN
INTRAMUSCULAR | Status: AC | PRN
Start: 2023-12-31 — End: 2023-12-31
  Administered 2023-12-31: 1 mg via INTRAVENOUS

## 2023-12-31 MED ORDER — LIDOCAINE-EPINEPHRINE 1 %-1:100000 IJ SOLN
INTRAMUSCULAR | Status: AC
Start: 2023-12-31 — End: 2023-12-31
  Filled 2023-12-31: qty 1

## 2023-12-31 MED ORDER — KETOROLAC TROMETHAMINE 10 MG PO TABS: 10.0000 mg | ORAL_TABLET | Freq: Four times a day (QID) | ORAL | 0 refills | Status: AC | PRN

## 2023-12-31 MED ORDER — DIPHENHYDRAMINE HCL 50 MG/ML IJ SOLN
INTRAMUSCULAR | Status: AC
  Filled 2023-12-31: qty 1

## 2023-12-31 MED ORDER — OXYCODONE HCL 5 MG PO TABS
5.0000 mg | ORAL_TABLET | ORAL | Status: AC
  Administered 2023-12-31: 5 mg via ORAL

## 2023-12-31 MED ORDER — SODIUM CHLORIDE 0.9 % IV SOLN
INTRAVENOUS | Status: AC
  Filled 2023-12-31: qty 20

## 2023-12-31 MED ORDER — MIDAZOLAM HCL 2 MG/2ML IJ SOLN
INTRAMUSCULAR | Status: AC
  Filled 2023-12-31: qty 6

## 2023-12-31 MED ORDER — LIDOCAINE-EPINEPHRINE 1 %-1:100000 IJ SOLN
20.0000 mL | Freq: Once | INTRAMUSCULAR | Status: AC
  Administered 2023-12-31: 20 mL via INTRADERMAL

## 2023-12-31 MED ORDER — MIDAZOLAM HCL (PF) 2 MG/2ML IJ SOLN
INTRAMUSCULAR | Status: AC | PRN
  Administered 2023-12-31: 2 mg via INTRAVENOUS

## 2023-12-31 MED ORDER — FENTANYL CITRATE (PF) 100 MCG/2ML IJ SOLN
INTRAMUSCULAR | Status: AC
  Filled 2023-12-31: qty 4

## 2023-12-31 MED ORDER — MORPHINE SULFATE (PF) 2 MG/ML IV SOLN: 2.0000 mg | Freq: Once | INTRAVENOUS | Status: DC

## 2023-12-31 NOTE — H&P (Signed)
 Chief Complaint: Left nephrolithiasis - IR consulted for left PCN placement for PCNL to follow with urology tomorrow  Referring Provider(s): McKenzie, Belvie CROME, MD  Supervising Physician: Vanice Revel  Patient Status: A [facility] - Out-pt  History of Present Illness: Chad Dooly. is a 33 y.o. male with hx of GERD, gout, and kidney stones. He has been followed by Dr. Sherrilee of urology for multiple left sided kidney stones. He has required multiple ER visits this year for pain related to the stones, most recent visit on 12/10/23 with CT renal stone study showing multiple left renal calculi, largest 27 mm with some mild hydronephrosis and hydroureter. Per urology request, pt now referred to IR for PCN placement with PCNL to follow with urology tomorrow 11/13.  Today patient still with some left flank pain, waxing and waning intensity. Denies anticoagulation use. Has been NPO since midnight.   Patient is Full Code  Past Medical History:  Diagnosis Date   GERD (gastroesophageal reflux disease)    Gout    H/O knee surgery    History of kidney stones     Past Surgical History:  Procedure Laterality Date   ADENOIDECTOMY     ANTERIOR CRUCIATE LIGAMENT REPAIR     ANTERIOR CRUCIATE LIGAMENT REPAIR Left    CYSTOSCOPY W/ URETERAL STENT PLACEMENT Left 01/17/2022   Procedure: CYSTOSCOPY WITH RETROGRADE PYELOGRAM/URETERAL STENT PLACEMENT;  Surgeon: Roseann Adine PARAS., MD;  Location: [facility];  Service: Urology;  Laterality: Left;   CYSTOSCOPY WITH RETROGRADE PYELOGRAM, URETEROSCOPY AND STENT PLACEMENT Bilateral 01/17/2022   Procedure: CYSTOSCOPY WITH BILATERAL RETROGRADE PYELOGRAM;  Surgeon: Roseann Adine PARAS., MD;  Location: [facility];  Service: Urology;  Laterality: Bilateral;   CYSTOSCOPY WITH RETROGRADE PYELOGRAM, URETEROSCOPY AND STENT PLACEMENT Left 03/07/2022   Procedure: CYSTOSCOPY WITH RETROGRADE PYELOGRAM, URETEROSCOPY AND STENT PLACEMENT;  Surgeon: Sherrilee Belvie CROME, MD;   Location: [facility];  Service: Urology;  Laterality: Left;   HOLMIUM LASER APPLICATION Left 03/07/2022   Procedure: HOLMIUM LASER APPLICATION;  Surgeon: Sherrilee Belvie CROME, MD;  Location: [facility];  Service: Urology;  Laterality: Left;   STONE EXTRACTION WITH BASKET Left 03/07/2022   Procedure: STONE EXTRACTION WITH BASKET;  Surgeon: Sherrilee Belvie CROME, MD;  Location: [facility];  Service: Urology;  Laterality: Left;   TYMPANOSTOMY TUBE PLACEMENT      Allergies: Sulfonamide derivatives  Medications: Prior to Admission medications   Medication Sig Start Date End Date Taking? Authorizing Provider  allopurinol  (ZYLOPRIM ) 100 MG tablet Take 1 tablet (100 mg total) by mouth daily. Patient not taking: No sig reported 12/21/21   Stoneking, Adine PARAS., MD  clotrimazole -betamethasone  (LOTRISONE ) cream Apply 1 Application topically 2 (two) times daily. Patient not taking: No sig reported 03/13/22   Sherrilee Belvie CROME, MD  ketorolac  (TORADOL ) 10 MG tablet Take 1 tablet (10 mg total) by mouth every 6 (six) hours as needed. Patient not taking: Reported on 12/30/2023 08/29/23   Gerldine Lauraine BROCKS, FNP  ondansetron  (ZOFRAN -ODT) 4 MG disintegrating tablet Take 1 tablet (4 mg total) by mouth every 8 (eight) hours as needed for nausea or vomiting. Patient not taking: No sig reported 06/30/23   Franklyn Sid SAILOR, MD  oxyCODONE  (OXY IR/ROXICODONE ) 5 MG immediate release tablet Take 1 tablet (5 mg total) by mouth every 4 (four) hours as needed for severe pain (pain score 7-10). Patient not taking: Reported on 12/30/2023 12/10/23   Idol, Julie, PA-C  oxyCODONE -acetaminophen  (PERCOCET/ROXICET) 5-325 MG tablet Take 1 tablet by mouth every 6 (six) hours  as needed for severe pain (pain score 7-10). Patient not taking: Reported on 12/30/2023 12/10/23   Idol, Julie, PA-C  Potassium Citrate  15 MEQ (1620 MG) TBCR Take 1 tablet by mouth 2 (two) times daily. Patient not taking: Reported on 12/30/2023 10/28/23   Matilda Senior, MD   sodium bicarbonate  650 MG tablet Take 1 tablet (650 mg total) by mouth 2 (two) times daily. Patient not taking: Reported on 12/30/2023 10/28/23   Matilda Senior, MD  tamsulosin  (FLOMAX ) 0.4 MG CAPS capsule Take 1 capsule by mouth daily as needed for stone symptoms. Advised to contact urology provider / request office visit if stone symptoms fail to resolve. Go to ER if symptoms become severe. Patient not taking: Reported on 12/30/2023 08/29/23   Gerldine Lauraine BROCKS, FNP     History reviewed. No pertinent family history.  Social History   Socioeconomic History   Marital status: Married    Spouse name: Not on file   Number of children: Not on file   Years of education: Not on file   Highest education level: Not on file  Occupational History   Not on file  Tobacco Use   Smoking status: Some Days    Passive exposure: Current   Smokeless tobacco: Current    Types: Snuff  Vaping Use   Vaping status: Never Used  Substance and Sexual Activity   Alcohol use: No   Drug use: No   Sexual activity: Not on file  Other Topics Concern   Not on file  Social History Narrative   Not on file   Social Drivers of Health   Financial Resource Strain: Not on file  Food Insecurity: Not on file  Transportation Needs: Not on file  Physical Activity: Not on file  Stress: Not on file  Social Connections: Not on file     Review of Systems: A 12 point ROS discussed and pertinent positives are indicated in the HPI above.  All other systems are negative.   Vital Signs: BP (!) 160/94 Comment: right upper arm  Pulse 84   Temp 99.1 F (37.3 C) (Oral)   Advance Care Plan: No documents on file  Physical Exam Vitals and nursing note reviewed.  Constitutional:      General: He is not in acute distress. HENT:     Mouth/Throat:     Mouth: Mucous membranes are moist.     Pharynx: Oropharynx is clear.  Cardiovascular:     Rate and Rhythm: Normal rate and regular rhythm.  Pulmonary:     Effort:  Pulmonary effort is normal.     Breath sounds: Normal breath sounds.  Abdominal:     Palpations: Abdomen is soft.     Tenderness: There is left CVA tenderness.     Comments: No radiation of pain, focal to left CVA  Musculoskeletal:     Right lower leg: No edema.     Left lower leg: No edema.  Skin:    General: Skin is warm and dry.  Neurological:     Mental Status: He is alert and oriented to person, place, and time. Mental status is at baseline.     Imaging: DG Abd 1 View Result Date: 12/12/2023 CLINICAL DATA:  Nephrolithiasis. EXAM: DG ABDOMEN 1V COMPARISON:  CT yesterday FINDINGS: The known left renal calculi are not well demonstrated, the largest stone is potentially visualized at the L2 level measuring 2.8 cm. Soft tissue attenuation from habitus limits detailed assessment. Moderate to large colonic stool burden. No bowel  obstruction IMPRESSION: Known left renal calculi are not well demonstrated, the largest stone is potentially visualized at the L2 level measuring 2.8 cm. Electronically Signed   By: Andrea Gasman M.D.   On: 12/12/2023 23:07   CT Renal Stone Study Result Date: 12/10/2023 CLINICAL DATA:  Left flank pain since Friday, hematuria EXAM: CT ABDOMEN AND PELVIS WITHOUT CONTRAST TECHNIQUE: Multidetector CT imaging of the abdomen and pelvis was performed following the standard protocol without IV contrast. RADIATION DOSE REDUCTION: This exam was performed according to the departmental dose-optimization program which includes automated exposure control, adjustment of the mA and/or kV according to patient size and/or use of iterative reconstruction technique. COMPARISON:  09/26/2023 FINDINGS: Lower chest: No acute pleural or parenchymal lung disease. Hepatobiliary: Stable hepatic steatosis with areas of fatty sparing near the gallbladder fossa. Gallbladder is unremarkable. No biliary duct dilation. Pancreas: Unremarkable unenhanced appearance. Spleen: Unremarkable unenhanced  appearance. Adrenals/Urinary Tract: Multiple nonobstructing left renal calculi are again identified, largest measuring up to 27 mm. Since the prior exam, mild left hydronephrosis and hydroureter as developed. No evidence of obstructing radiopaque calculus. Findings could reflect obstruction from downstream blood clot given reported history of hematuria. Further evaluation with cystoscopy and ureteroscopy could be considered. The right kidney is unremarkable. The adrenals and bladder appear normal. Stomach/Bowel: No bowel obstruction or ileus. No bowel wall thickening or inflammatory change. Vascular/Lymphatic: No significant vascular findings are present. No enlarged abdominal or pelvic lymph nodes. Reproductive: Prostate is unremarkable. Other: No free fluid or free intraperitoneal gas. No abdominal wall hernia. Musculoskeletal: No acute or destructive bony abnormalities. Reconstructed images demonstrate no additional findings. IMPRESSION: 1. Multiple nonobstructing left renal calculi, not appreciably changed in size or position since prior study. 2. Interval development of mild left-sided hydronephrosis and hydroureter, with no evidence of obstructing downstream radiopaque calculus. Findings could reflect obstruction from blood clot given history of hematuria. If further evaluation is desired, cystoscopy/ureteroscopy could be considered. 3. Stable hepatic steatosis. Electronically Signed   By: Ozell Daring M.D.   On: 12/10/2023 17:23    Labs:  CBC: Recent Labs    06/30/23 1659 12/10/23 1708 12/31/23 0946  WBC 8.9 8.1 8.5  HGB 14.4 13.9 15.1  HCT 41.3 41.6 44.6  PLT 213 259 269    COAGS: Recent Labs    12/31/23 0946  INR 0.9    BMP: Recent Labs    06/30/23 1659 12/10/23 1708  NA 132* 141  K 4.0 4.0  CL 98 104  CO2 24 25  GLUCOSE 255* 208*  BUN 14 12  CALCIUM 8.6* 8.7*  CREATININE 1.11 0.83  GFRNONAA >60 >60    LIVER FUNCTION TESTS: Recent Labs    06/30/23 1659  12/10/23 1708  BILITOT 0.5 <0.2  AST 19 21  ALT 29 31  ALKPHOS 68 74  PROT 7.0 6.6  ALBUMIN 3.3* 3.8    TUMOR MARKERS: No results for input(s): AFPTM, CEA, CA199, CHROMGRNA in the last 8760 hours.  Assessment and Plan:  Chad Bearce. is a 33 y.o. male with hx of GERD, gout, and kidney stones. He has been followed by Dr. Sherrilee of urology for multiple left sided kidney stones. He has required multiple ER visits this year for pain related to the stones, most recent visit on 12/10/23 with CT renal stone study showing multiple left renal calculi, largest 27 mm with some mild hydronephrosis and hydroureter. Per urology request, pt now referred to IR for PCN placement with PCNL to follow with urology  tomorrow 11/13.  Today patient still with some mild left flank pain, waxing and waning intensity. Denies anticoagulation use. Has been NPO since midnight.  Risks and benefits of left PCN placement was discussed with the patient including, but not limited to, infection, bleeding, significant bleeding causing loss or decrease in renal function or damage to adjacent structures.   All of the patient's questions were answered, patient is agreeable to proceed.  Consent signed and in chart.   Thank you for allowing our service to participate in Chad Saha Tyquan Carmickle. 's care.  Electronically Signed: Kimble VEAR Clas, PA-C   12/31/2023, 10:33 AM      I spent a total of  30 Minutes   in face to face in clinical consultation, greater than 50% of which was counseling/coordinating care for left percutaneous nephrostomy placement

## 2023-12-31 NOTE — Procedures (Signed)
 Interventional Radiology Procedure Note  Procedure: US  AND FLUORO LEFT PCN ACCESS FOR PCNL    Complications: None  Estimated Blood Loss:  MIN  Findings: 5FR ACCESS THRU LOWER POLE CALYX CONTAINING STONE TERMINATING IN THE BLADDER PLAN FOR OR PCNL AT [facility] TOMORROW    M. TREVOR Aleanna Menge, MD

## 2023-12-31 NOTE — Telephone Encounter (Signed)
 Pt called requesting pain meds or something for bladder spasms, per verbal from MD McKenzie  have him take ibuprofen  800 mg or refill his toradol  for the pain. Pt notified and sent to pharmacy

## 2024-01-01 ENCOUNTER — Other Ambulatory Visit: Payer: Self-pay

## 2024-01-01 ENCOUNTER — Ambulatory Visit (HOSPITAL_COMMUNITY): Admitting: Anesthesiology

## 2024-01-01 ENCOUNTER — Encounter (HOSPITAL_COMMUNITY)
Admission: RE | Disposition: A | Discharge status: Home / Self Care | Payer: Self-pay | Source: Home / Self Care | Attending: Urology

## 2024-01-01 ENCOUNTER — Encounter (HOSPITAL_COMMUNITY): Payer: Self-pay | Admitting: Urology

## 2024-01-01 ENCOUNTER — Ambulatory Visit (HOSPITAL_COMMUNITY)

## 2024-01-01 ENCOUNTER — Observation Stay (HOSPITAL_COMMUNITY)
Admission: RE | Admit: 2024-01-01 | Discharge: 2024-01-02 | Disposition: A | Discharge status: Home / Self Care | Attending: Urology | Admitting: Urology

## 2024-01-01 DIAGNOSIS — F411 Generalized anxiety disorder: Secondary | ICD-10-CM | POA: Diagnosis not present

## 2024-01-01 DIAGNOSIS — F1120 Opioid dependence, uncomplicated: Secondary | ICD-10-CM

## 2024-01-01 DIAGNOSIS — N200 Calculus of kidney: Secondary | ICD-10-CM | POA: Diagnosis present

## 2024-01-01 DIAGNOSIS — F17210 Nicotine dependence, cigarettes, uncomplicated: Secondary | ICD-10-CM | POA: Insufficient documentation

## 2024-01-01 DIAGNOSIS — I10 Essential (primary) hypertension: Secondary | ICD-10-CM

## 2024-01-01 LAB — CBC
HCT: 41.1 % (ref 39.0–52.0)
Hemoglobin: 13.9 g/dL (ref 13.0–17.0)
MCH: 30.5 pg (ref 26.0–34.0)
MCHC: 33.8 g/dL (ref 30.0–36.0)
MCV: 90.3 fL (ref 80.0–100.0)
Platelets: 225 K/uL (ref 150–400)
RBC: 4.55 MIL/uL (ref 4.22–5.81)
RDW: 13.2 % (ref 11.5–15.5)
WBC: 8.4 K/uL (ref 4.0–10.5)
nRBC: 0 % (ref 0.0–0.2)

## 2024-01-01 LAB — BASIC METABOLIC PANEL WITH GFR
Anion gap: 12 (ref 5–15)
BUN: 15 mg/dL (ref 6–20)
CO2: 24 mmol/L (ref 22–32)
Calcium: 8.3 mg/dL — ABNORMAL LOW (ref 8.9–10.3)
Chloride: 103 mmol/L (ref 98–111)
Creatinine, Ser: 0.74 mg/dL (ref 0.61–1.24)
GFR, Estimated: >60 mL/min (ref >60)
Glucose, Bld: 206 mg/dL — ABNORMAL HIGH (ref 70–99)
Potassium: 4.3 mmol/L (ref 3.5–5.1)
Sodium: 138 mmol/L (ref 135–145)

## 2024-01-01 LAB — GLUCOSE, CAPILLARY: Glucose-Capillary: 199 mg/dL — ABNORMAL HIGH (ref 70–99)

## 2024-01-01 SURGERY — NEPHROLITHOTOMY PERCUTANEOUS
Anesthesia: General | Laterality: Left

## 2024-01-01 MED ORDER — MIDAZOLAM HCL 5 MG/5ML IJ SOLN
INTRAMUSCULAR | Status: DC | PRN
  Administered 2024-01-01: 2 mg via INTRAVENOUS

## 2024-01-01 MED ORDER — DIATRIZOATE MEGLUMINE 30 % UR SOLN
URETHRAL | Status: AC
Start: 2024-01-01 — End: 2024-01-01
  Filled 2024-01-01: qty 100

## 2024-01-01 MED ORDER — FENTANYL CITRATE (PF) 50 MCG/ML IJ SOSY
25.0000 ug | PREFILLED_SYRINGE | INTRAMUSCULAR | Status: DC | PRN
  Administered 2024-01-01 (×3): 50 ug via INTRAVENOUS
  Filled 2024-01-01 (×3): qty 1

## 2024-01-01 MED ORDER — FENTANYL CITRATE (PF) 100 MCG/2ML IJ SOLN
INTRAMUSCULAR | Status: DC | PRN
  Administered 2024-01-01 (×2): 50 ug via INTRAVENOUS
  Administered 2024-01-01: 100 ug via INTRAVENOUS

## 2024-01-01 MED ORDER — ORAL CARE MOUTH RINSE: 15.0000 mL | Freq: Once | OROMUCOSAL | Status: AC

## 2024-01-01 MED ORDER — ONDANSETRON HCL 4 MG/2ML IJ SOLN: 4.0000 mg | INTRAMUSCULAR | Status: DC | PRN

## 2024-01-01 MED ORDER — HYDROMORPHONE HCL 1 MG/ML IJ SOLN
0.5000 mg | INTRAMUSCULAR | Status: DC | PRN
  Administered 2024-01-01 – 2024-01-02 (×3): 1 mg via INTRAVENOUS
  Filled 2024-01-01 (×3): qty 1

## 2024-01-01 MED ORDER — OXYCODONE HCL 5 MG/5ML PO SOLN: 5.0000 mg | Freq: Once | ORAL | Status: AC | PRN

## 2024-01-01 MED ORDER — ROCURONIUM BROMIDE 10 MG/ML (PF) SYRINGE
PREFILLED_SYRINGE | INTRAVENOUS | Status: DC | PRN
  Administered 2024-01-01: 60 mg via INTRAVENOUS
  Administered 2024-01-01: 20 mg via INTRAVENOUS

## 2024-01-01 MED ORDER — ZOLPIDEM TARTRATE 5 MG PO TABS: 5.0000 mg | ORAL_TABLET | Freq: Every evening | ORAL | Status: DC | PRN

## 2024-01-01 MED ORDER — MIDAZOLAM HCL 2 MG/2ML IJ SOLN
INTRAMUSCULAR | Status: AC
  Filled 2024-01-01: qty 2

## 2024-01-01 MED ORDER — OXYCODONE HCL 5 MG PO TABS
5.0000 mg | ORAL_TABLET | ORAL | Status: DC | PRN
  Administered 2024-01-01 – 2024-01-02 (×4): 5 mg via ORAL
  Filled 2024-01-01 (×4): qty 1

## 2024-01-01 MED ORDER — ONDANSETRON HCL 4 MG/2ML IJ SOLN
INTRAMUSCULAR | Status: AC
  Filled 2024-01-01: qty 2

## 2024-01-01 MED ORDER — SODIUM CHLORIDE 0.9 % IV SOLN: INTRAVENOUS | Status: AC

## 2024-01-01 MED ORDER — ORAL CARE MOUTH RINSE
15.0000 mL | OROMUCOSAL | Status: DC | PRN
Start: 2024-01-01 — End: 2024-01-02

## 2024-01-01 MED ORDER — OXYCODONE HCL 5 MG PO TABS
5.0000 mg | ORAL_TABLET | Freq: Once | ORAL | Status: AC | PRN
  Administered 2024-01-01: 5 mg via ORAL
  Filled 2024-01-01: qty 1

## 2024-01-01 MED ORDER — SODIUM BICARBONATE 650 MG PO TABS
650.0000 mg | ORAL_TABLET | Freq: Two times a day (BID) | ORAL | Status: DC
  Administered 2024-01-01 – 2024-01-02 (×2): 650 mg via ORAL
  Filled 2024-01-01 (×2): qty 1

## 2024-01-01 MED ORDER — ACETAMINOPHEN 325 MG PO TABS: 650.0000 mg | ORAL_TABLET | ORAL | Status: DC | PRN

## 2024-01-01 MED ORDER — POTASSIUM CHLORIDE CRYS ER 10 MEQ PO TBCR
10.0000 meq | EXTENDED_RELEASE_TABLET | Freq: Two times a day (BID) | ORAL | Status: DC
  Administered 2024-01-02: 10 meq via ORAL
  Filled 2024-01-01: qty 1

## 2024-01-01 MED ORDER — DEXTROSE 5 % IV SOLN
INTRAVENOUS | Status: DC | PRN
  Administered 2024-01-01: 2 g via INTRAVENOUS

## 2024-01-01 MED ORDER — SENNOSIDES-DOCUSATE SODIUM 8.6-50 MG PO TABS
2.0000 | ORAL_TABLET | Freq: Every day | ORAL | Status: DC
  Administered 2024-01-01: 2 via ORAL
  Filled 2024-01-01: qty 2

## 2024-01-01 MED ORDER — SODIUM CHLORIDE 0.9 % IV SOLN: INTRAVENOUS | Status: DC | PRN

## 2024-01-01 MED ORDER — LIDOCAINE 2% (20 MG/ML) 5 ML SYRINGE
INTRAMUSCULAR | Status: DC | PRN
  Administered 2024-01-01: 100 mg via INTRAVENOUS

## 2024-01-01 MED ORDER — ONDANSETRON HCL 4 MG/2ML IJ SOLN
INTRAMUSCULAR | Status: DC | PRN
  Administered 2024-01-01: 4 mg via INTRAVENOUS

## 2024-01-01 MED ORDER — ONDANSETRON HCL 4 MG/2ML IJ SOLN: 4.0000 mg | Freq: Once | INTRAMUSCULAR | Status: DC | PRN

## 2024-01-01 MED ORDER — STERILE WATER FOR IRRIGATION IR SOLN
Status: DC | PRN
  Administered 2024-01-01: 1000 mL

## 2024-01-01 MED ORDER — SODIUM CHLORIDE 0.9 % IR SOLN
Status: DC | PRN
  Administered 2024-01-01 (×2): 3000 mL

## 2024-01-01 MED ORDER — FENTANYL CITRATE (PF) 250 MCG/5ML IJ SOLN
INTRAMUSCULAR | Status: AC
  Filled 2024-01-01: qty 5

## 2024-01-01 MED ORDER — TAMSULOSIN HCL 0.4 MG PO CAPS
0.4000 mg | ORAL_CAPSULE | Freq: Every day | ORAL | Status: DC
  Administered 2024-01-01: 0.4 mg via ORAL
  Filled 2024-01-01: qty 1

## 2024-01-01 MED ORDER — DIPHENHYDRAMINE HCL 50 MG/ML IJ SOLN: 12.5000 mg | Freq: Four times a day (QID) | INTRAMUSCULAR | Status: DC | PRN

## 2024-01-01 MED ORDER — ALLOPURINOL 100 MG PO TABS
100.0000 mg | ORAL_TABLET | Freq: Every day | ORAL | Status: DC
  Administered 2024-01-02: 100 mg via ORAL
  Filled 2024-01-01: qty 1

## 2024-01-01 MED ORDER — SODIUM CHLORIDE 0.9 % IV SOLN
INTRAVENOUS | Status: AC
  Filled 2024-01-01: qty 20

## 2024-01-01 MED ORDER — CHLORHEXIDINE GLUCONATE 0.12 % MT SOLN
15.0000 mL | Freq: Once | OROMUCOSAL | Status: AC
Start: 2024-01-01 — End: 2024-01-01
  Administered 2024-01-01: 15 mL via OROMUCOSAL

## 2024-01-01 MED ORDER — DEXMEDETOMIDINE HCL IN NACL 80 MCG/20ML IV SOLN
INTRAVENOUS | Status: DC | PRN
  Administered 2024-01-01: 8 ug via INTRAVENOUS
  Administered 2024-01-01: 200 ug via INTRAVENOUS

## 2024-01-01 MED ORDER — DIPHENHYDRAMINE HCL 12.5 MG/5ML PO ELIX: 12.5000 mg | ORAL_SOLUTION | Freq: Four times a day (QID) | ORAL | Status: DC | PRN

## 2024-01-01 MED ORDER — PROPOFOL 10 MG/ML IV BOLUS
INTRAVENOUS | Status: DC | PRN
  Administered 2024-01-01: 200 mg via INTRAVENOUS
  Administered 2024-01-01: 100 mg via INTRAVENOUS

## 2024-01-01 MED ORDER — DIATRIZOATE MEGLUMINE 30 % UR SOLN
URETHRAL | Status: DC | PRN
  Administered 2024-01-01: 10 mL via URETHRAL

## 2024-01-01 MED ORDER — LACTATED RINGERS IV SOLN: INTRAVENOUS | Status: DC

## 2024-01-01 MED ORDER — OXYBUTYNIN CHLORIDE 5 MG PO TABS
5.0000 mg | ORAL_TABLET | Freq: Three times a day (TID) | ORAL | Status: DC | PRN
  Administered 2024-01-01: 5 mg via ORAL
  Filled 2024-01-01: qty 1

## 2024-01-01 SURGICAL SUPPLY — 43 items
BAG DECANTER FOR FLEXI CONT (MISCELLANEOUS) ×2 IMPLANT
BAG URINE DRAIN 2000ML AR STRL (UROLOGICAL SUPPLIES) ×2 IMPLANT
BENZOIN TINCTURE PRP APPL 2/3 (GAUZE/BANDAGES/DRESSINGS) ×4 IMPLANT
BLADE SURG 15 STRL LF DISP TIS (BLADE) ×2 IMPLANT
CATH FOLEY 2WAY SLVR 18FR 30CC (CATHETERS) IMPLANT
CATH UROLOGY TORQUE 65 (CATHETERS) ×2 IMPLANT
CATH X-FORCE N30 NEPHROSTOMY (TUBING) ×2 IMPLANT
CHLORAPREP W/TINT 26 (MISCELLANEOUS) IMPLANT
COUNTER NDL MAGNETIC 40 RED (SET/KITS/TRAYS/PACK) ×2 IMPLANT
COUNTER NEEDLE MAGNETIC 40 RED (SET/KITS/TRAYS/PACK) ×1 IMPLANT
COVER LIGHT HANDLE STERIS (MISCELLANEOUS) ×4 IMPLANT
DRAPE C-ARM FOLDED MOBILE STRL (DRAPES) ×2 IMPLANT
DRAPE HALF SHEET 40X57 (DRAPES) ×2 IMPLANT
DRAPE LINGEMAN PERC (DRAPES) ×2 IMPLANT
DRSG TEGADERM 8X12 (GAUZE/BANDAGES/DRESSINGS) ×6 IMPLANT
GAUZE SPONGE 4X4 12PLY STRL (GAUZE/BANDAGES/DRESSINGS) ×2 IMPLANT
GLOVE BIO SURGEON STRL SZ8 (GLOVE) ×2 IMPLANT
GLOVE BIOGEL PI IND STRL 7.0 (GLOVE) ×4 IMPLANT
GLOVE BIOGEL PI IND STRL 8 (GLOVE) IMPLANT
GOWN STRL REUS W/TWL LRG LVL3 (GOWN DISPOSABLE) ×2 IMPLANT
GOWN STRL REUS W/TWL XL LVL3 (GOWN DISPOSABLE) ×2 IMPLANT
GUIDEWIRE AMPLAZ .035X145 (WIRE) ×2 IMPLANT
GUIDEWIRE STR DUAL SENSOR (WIRE) ×2 IMPLANT
IV CONNECTOR ONE LINK NDLESS (IV SETS) ×2 IMPLANT
IV NS 500ML BAXH (IV SOLUTION) ×2 IMPLANT
KIT PROBE TRILOGY 3.9X350 (MISCELLANEOUS) ×2 IMPLANT
KIT TURNOVER KIT A (KITS) ×2 IMPLANT
MANIFOLD NEPTUNE II (INSTRUMENTS) ×2 IMPLANT
PACK CYSTO (CUSTOM PROCEDURE TRAY) ×2 IMPLANT
PAD ABD 5X9 TENDERSORB (GAUZE/BANDAGES/DRESSINGS) ×4 IMPLANT
PAD ARMBOARD POSITIONER FOAM (MISCELLANEOUS) ×2 IMPLANT
POSITIONER FOAM HEAD TRAC HOLE (MISCELLANEOUS) ×2 IMPLANT
POSITIONER HEAD 8X9X4 ADT (SOFTGOODS) ×2 IMPLANT
SET BASIN LINEN APH (SET/KITS/TRAYS/PACK) ×2 IMPLANT
SHEATH PEELAWAY SET 9 (SHEATH) ×2 IMPLANT
SOL .9 NS 3000ML IRR UROMATIC (IV SOLUTION) ×4 IMPLANT
SOLN STERILE WATER BTL 1000 ML (IV SOLUTION) ×2 IMPLANT
SPONGE DRAIN TRACH 4X4 STRL 2S (GAUZE/BANDAGES/DRESSINGS) ×2 IMPLANT
STENT CONTOUR 6FRX28X.038 (STENTS) IMPLANT
SUT SILK 2 0 SH (SUTURE) ×2 IMPLANT
SYR 50ML LL SCALE MARK (SYRINGE) ×2 IMPLANT
TRAY FOLEY W/BAG SLVR 16FR ST (SET/KITS/TRAYS/PACK) ×2 IMPLANT
TUBE CONNECTING 12X1/4 (SUCTIONS) ×4 IMPLANT

## 2024-01-01 NOTE — Op Note (Signed)
 Preoperative diagnosis: Left renal stone  Postoperative diagnosis: Same  Procedure 1.  Left percutaneous nephrostolithotomy for stone greater than 2 cm 2.  Left nephrostogram 3.  Intraoperative fluoroscopy, under 1 hour, with interpretation 4.  Placement of a 6 x 26 double-J ureteral stent. 5.  Left ureteroscopy with lithotripsy and stone extraction 6.  Placement of a 5 French nephrostomy tube 7.  Dilation of percutaneous tract  Attending: Dr. Sherrilee  Anesthesia: General  Estimated blood loss: Minimal  Antibiotics: rocephin   Drains: 1.  16 French Foley catheter 2.  6 x 26 left double-J ureteral stent 3.  18 French nephrostomy tube  Specimens: Stone for analysis  Findings: 4cm renal pelvis stone extending to proximal ureter  Indications: Patient is a 33 year old male with a history of large left renal stones.  After discussing treatment options and decided she was left percutaneous nephrostolithotomy.  Patient already has a nephrostomy tube.  Procedure in detail: Prior to procedure consent was obtained.  Patient was brought to the operating room debridement was done to ensure correct patient, correct procedure, and correct site.  General anesthesia was administered.  A 16 French Foley catheter was in place.  The patient was then placed in the prone position.  His nephrostomy tube and left flank was then prepped and draped in usual sterile fashion.  A nephrostogram was obtained and findings noted above.  Through the nephrostomy tube we then placed a sensor wire.  Sensor wire was coiled in the renal pelvis we then removed the nephrostomy tube.  We then made an incision at the level of the skin and over the wire we then placed a NephroMax dilator.  We dilated the nephrostomy tract to 30 French and held this 18 cm of water  for 1 minute.  We then  placed the access sheath over the balloon.  The balloon was then deflated.  We then used a rigid nephroscope to perform nephroscopy.  We  encountered a large renal pelvis stone that was impacted at the ureteropelvic junction.  Using the trilogy the stone was fragmented and removed. We then placed a second wire through the nephroscope and into the bladder.  We then removed the ureteroscope and over the wire placed a 6 x 26 double-J ureteral stent.  The wire was then removed and good coil was noted in the renal pelvis under direct vision in the bladder under fluoroscopy.  We then placed a 18 French nephrostomy tube through the sheath into the renal pelvis.  The balloon was inflated with 3 amounts of contrast.  We then removed the access sheath in and obtain another nephrostogram.  We noted minimal extravasation of contrast.  We then secured the nephrostomy tubes with 0 silks in interrupted fashion.  Dressing was placed over the nephrostomy tube site and this then concluded the procedure was well-tolerated by the patient.  Complications: None  Condition: Stable, extubated, transferred to PACU  Plan: Patient is to be admitted overnight for observation.  His Foley catheter through the morning.  He is then to be discharged home and followup in 3-4 days for nephrostomy tube removal and 10 days for stent removal

## 2024-01-01 NOTE — Transfer of Care (Signed)
 Immediate Anesthesia Transfer of Care Note  Patient: Chad Weaver.  Procedure(s) Performed: NEPHROLITHOTOMY PERCUTANEOUS (Left)  Patient Location: PACU  Anesthesia Type:General  Level of Consciousness: awake and alert   Airway & Oxygen Therapy: Patient Spontanous Breathing  Post-op Assessment: Post -op Vital signs reviewed and stable  Post vital signs: Reviewed and stable  Last Vitals:  Vitals Value Taken Time  BP 139/84 01/01/24 10:35  Temp 36.6 C 01/01/24 10:33  Pulse 94 01/01/24 10:38  Resp 19 01/01/24 10:38  SpO2 99 % 01/01/24 10:38  Vitals shown include unfiled device data.  Last Pain:  Vitals:   01/01/24 1033  PainSc: 10-Worst pain ever      Patients Stated Pain Goal: 10 (01/01/24 0727)  Complications: No notable events documented.

## 2024-01-01 NOTE — Plan of Care (Signed)

## 2024-01-01 NOTE — Anesthesia Preprocedure Evaluation (Signed)
 Anesthesia Evaluation  Patient identified by MRN, date of birth, ID band Patient awake    Reviewed: Allergy & Precautions, H&P , NPO status , Patient's Chart, lab work & pertinent test results, reviewed documented beta blocker date and time   Airway Mallampati: III  TM Distance: >3 FB Neck ROM: full    Dental no notable dental hx.    Pulmonary neg pulmonary ROS   Pulmonary exam normal breath sounds clear to auscultation       Cardiovascular Exercise Tolerance: Good hypertension, negative cardio ROS  Rhythm:regular Rate:Normal     Neuro/Psych  PSYCHIATRIC DISORDERS Anxiety     negative neurological ROS     GI/Hepatic Neg liver ROS,GERD  ,,  Endo/Other    Class 4 obesity  Renal/GU Renal disease  negative genitourinary   Musculoskeletal   Abdominal   Peds  Hematology negative hematology ROS (+)   Anesthesia Other Findings   Reproductive/Obstetrics negative OB ROS                              Anesthesia Physical Anesthesia Plan  ASA: 3  Anesthesia Plan: MAC and General ETT   Post-op Pain Management:    Induction:   PONV Risk Score and Plan: Ondansetron   Airway Management Planned:   Additional Equipment:   Intra-op Plan:   Post-operative Plan:   Informed Consent: I have reviewed the patients History and Physical, chart, labs and discussed the procedure including the risks, benefits and alternatives for the proposed anesthesia with the patient or authorized representative who has indicated his/her understanding and acceptance.     Dental Advisory Given  Plan Discussed with: CRNA  Anesthesia Plan Comments:         Anesthesia Quick Evaluation

## 2024-01-01 NOTE — H&P (Signed)
 History of Present Illness: Chad Weaver is a 33 y.o. male who presents today for follow up visit at [facility] [place]. Relevant History includes: 1. Kidney stones, recurrent.   At last visit with Dr. Sherrilee on 09/13/2022: The plan was to restart sodium bicarb and urocit K for stone prevention.   Since last visit: > 06/30/2023:  - Seen in ER for left flank/left abdominal pain radiating into left testicle. - Urine microscopy: unremarkable - Renal function normal (GFR >60; creatinine 1.11). CBC with no leukocytosis (WBC 8.9). CT showed two 4 mm distal left ureteral stones with resultant hydroureteronephrosis. Multiple nonobstructing left renal calculi measuring up to 15 mm. Prescriptions given for Flomax , Zofran , Toradol , and Oxycodone .   > 08/01/2023:  RUS showed multiple left intrarenal stones (largest measuring 25.4 mm) with interval resolution of left hydroureteronephrosis.   Today: He reports denies that over past week or so he's been having significant intermittent left flank pain radiating into his LLQ along with gross hematuria. Also having increased urinary urgency and frequency. Denies dysuria, hesitancy, straining to void, or sensations of incomplete emptying. He denies fevers. Reports intermittent nausea; states he still has an adequate supply of Zofran  for PRN use.     Medications:       Current Outpatient Medications  Medication Sig Dispense Refill   tamsulosin  (FLOMAX ) 0.4 MG CAPS capsule Take 1 capsule by mouth daily as needed for stone symptoms. Advised to contact urology provider / request office visit if stone symptoms fail to resolve. Go to ER if symptoms become severe. 30 capsule 1   allopurinol  (ZYLOPRIM ) 100 MG tablet Take 1 tablet (100 mg total) by mouth daily. (Patient not taking: Reported on 08/29/2023) 30 tablet 5   clotrimazole -betamethasone  (LOTRISONE ) cream Apply 1 Application topically 2 (two) times daily. (Patient not taking: Reported on 08/29/2023)  30 g 3   ketorolac  (TORADOL ) 10 MG tablet Take 1 tablet (10 mg total) by mouth every 6 (six) hours as needed. 20 tablet 0   ondansetron  (ZOFRAN -ODT) 4 MG disintegrating tablet Take 1 tablet (4 mg total) by mouth every 8 (eight) hours as needed for nausea or vomiting. (Patient not taking: Reported on 08/29/2023) 20 tablet 0   oxyCODONE  (ROXICODONE ) 5 MG immediate release tablet Take 1 tablet (5 mg total) by mouth every 4 (four) hours as needed for severe pain (pain score 7-10). 30 tablet 0   Potassium Citrate  15 MEQ (1620 MG) TBCR Take 1 tablet by mouth 2 (two) times daily. (Patient not taking: Reported on 08/29/2023) 60 tablet 2   sodium bicarbonate  650 MG tablet Take 1 tablet (650 mg total) by mouth 2 (two) times daily. (Patient not taking: Reported on 08/29/2023) 60 tablet 2      No current facility-administered medications for this visit.        Allergies: Allergies      Allergies  Allergen Reactions   Sulfonamide Derivatives Swelling and Rash            Past Medical History:  Diagnosis Date   GERD (gastroesophageal reflux disease)     Gout     H/O knee surgery     History of kidney stones     Kidney stones               Past Surgical History:  Procedure Laterality Date   ADENOIDECTOMY       ANTERIOR CRUCIATE LIGAMENT REPAIR       ANTERIOR CRUCIATE LIGAMENT REPAIR Left     CYSTOSCOPY  W/ URETERAL STENT PLACEMENT Left 01/17/2022    Procedure: CYSTOSCOPY WITH RETROGRADE PYELOGRAM/URETERAL STENT PLACEMENT;  Surgeon: Roseann Adine PARAS., MD;  Location: [facility];  Service: Urology;  Laterality: Left;   CYSTOSCOPY WITH RETROGRADE PYELOGRAM, URETEROSCOPY AND STENT PLACEMENT Bilateral 01/17/2022    Procedure: CYSTOSCOPY WITH BILATERAL RETROGRADE PYELOGRAM;  Surgeon: Roseann Adine PARAS., MD;  Location: [facility];  Service: Urology;  Laterality: Bilateral;   CYSTOSCOPY WITH RETROGRADE PYELOGRAM, URETEROSCOPY AND STENT PLACEMENT Left 03/07/2022    Procedure: CYSTOSCOPY WITH RETROGRADE  PYELOGRAM, URETEROSCOPY AND STENT PLACEMENT;  Surgeon: Sherrilee Belvie CROME, MD;  Location: [facility];  Service: Urology;  Laterality: Left;   HOLMIUM LASER APPLICATION Left 03/07/2022    Procedure: HOLMIUM LASER APPLICATION;  Surgeon: Sherrilee Belvie CROME, MD;  Location: [facility];  Service: Urology;  Laterality: Left;   STONE EXTRACTION WITH BASKET Left 03/07/2022    Procedure: STONE EXTRACTION WITH BASKET;  Surgeon: Sherrilee Belvie CROME, MD;  Location: [facility];  Service: Urology;  Laterality: Left;   TYMPANOSTOMY TUBE PLACEMENT            History reviewed. No pertinent family history.     Social History         Socioeconomic History   Marital status: Married      Spouse name: Not on file   Number of children: Not on file   Years of education: Not on file   Highest education level: Not on file  Occupational History   Not on file  Tobacco Use   Smoking status: Some Days      Current packs/day: 0.50      Types: Cigarettes      Passive exposure: Current   Smokeless tobacco: Current      Types: Snuff  Vaping Use   Vaping status: Never Used  Substance and Sexual Activity   Alcohol use: No   Drug use: No   Sexual activity: Not on file  Other Topics Concern   Not on file  Social History Narrative   Not on file    Social Drivers of Health    Financial Resource Strain: Not on file  Food Insecurity: Not on file  Transportation Needs: Not on file  Physical Activity: Not on file  Stress: Not on file  Social Connections: Not on file  Intimate Partner Violence: Not on file      SUBJECTIVE   Review of Systems Constitutional: Patient denies any unintentional weight loss or change in strength lntegumentary: Patient denies any rashes or pruritus Cardiovascular: Patient denies chest pain or syncope Respiratory: Patient denies shortness of breath Gastrointestinal: As per HPI Musculoskeletal: Patient denies muscle cramps or weakness Neurologic: Patient denies convulsions or  seizures Allergic/Immunologic: Patient denies recent allergic reaction(s) Hematologic/Lymphatic: Patient denies bleeding tendencies Endocrine: Patient denies heat/cold intolerance   GU: As per HPI.   OBJECTIVE    Vitals:    08/29/23 1154  BP: (!) 140/87  Pulse: (!) 109    There is no height or weight on file to calculate BMI.   Physical Examination Constitutional: No obvious distress; patient is non-toxic appearing  Cardiovascular: No visible lower extremity edema.  Respiratory: The patient does not have audible wheezing/stridor; respirations do not appear labored  Gastrointestinal: Abdomen non-distended Musculoskeletal: Normal ROM of UEs  Skin: No obvious rashes/open sores  Neurologic: CN 2-12 grossly intact Psychiatric: Answered questions appropriately with normal affect  Hematologic/Lymphatic/Immunologic: No obvious bruises or sites of spontaneous bleeding   Urine microscopy: >30 RBC/hpf, otherwise unremarkable  ASSESSMENT Nephrolithiasis: -We discussed the management of kidney stones. These options include observation, ureteroscopy, shockwave lithotripsy (ESWL) and percutaneous nephrolithotomy (PCNL). We discussed which options are relevant to the patient's stone(s). We discussed the natural history of kidney stones as well as the complications of untreated stones and the impact on quality of life without treatment as well as with each of the above listed treatments. We also discussed the efficacy of each treatment in its ability to clear the stone burden. With any of these management options I discussed the signs and symptoms of infection and the need for emergent treatment should these be experienced. For each option we discussed the ability of each procedure to clear the patient of their stone burden.   For observation I described the risks which include but are not limited to silent renal damage, life-threatening infection, need for emergent surgery, failure to pass stone  and pain.   For ureteroscopy I described the risks which include bleeding, infection, damage to contiguous structures, positioning injury, ureteral stricture, ureteral avulsion, ureteral injury, need for prolonged ureteral stent, inability to perform ureteroscopy, need for an interval procedure, inability to clear stone burden, stent discomfort/pain, heart attack, stroke, pulmonary embolus and the inherent risks with general anesthesia.   For shockwave lithotripsy I described the risks which include arrhythmia, kidney contusion, kidney hemorrhage, need for transfusion, pain, inability to adequately break up stone, inability to pass stone fragments, Steinstrasse, infection associated with obstructing stones, need for alternate surgical procedure, need for repeat shockwave lithotripsy, MI, CVA, PE and the inherent risks with anesthesia/conscious sedation.   For PCNL I described the risks including positioning injury, pneumothorax, hydrothorax, need for chest tube, inability to clear stone burden, renal laceration, arterial venous fistula or malformation, need for embolization of kidney, loss of kidney or renal function, need for repeat procedure, need for prolonged nephrostomy tube, ureteral avulsion, MI, CVA, PE and the inherent risks of general anesthesia.   - The patient would like to proceed with left PCNL

## 2024-01-01 NOTE — Anesthesia Procedure Notes (Signed)
 Procedure Name: Intubation Date/Time: 01/01/2024 8:59 AM  Performed by: Toribio Darice BRAVO, CRNAPre-anesthesia Checklist: Patient identified, Patient being monitored, Timeout performed, Emergency Drugs available and Suction available Patient Re-evaluated:Patient Re-evaluated prior to induction Oxygen Delivery Method: Circle system utilized Preoxygenation: Pre-oxygenation with 100% oxygen Induction Type: IV induction Ventilation: Mask ventilation without difficulty Laryngoscope Size: Mac and 4 Grade View: Grade I Tube type: Oral Tube size: 8.0 mm Number of attempts: 1 Airway Equipment and Method: Stylet Placement Confirmation: ETT inserted through vocal cords under direct vision, positive ETCO2 and breath sounds checked- equal and bilateral Secured at: 23 cm Tube secured with: Tape Dental Injury: Teeth and Oropharynx as per pre-operative assessment

## 2024-01-02 ENCOUNTER — Telehealth: Payer: Self-pay

## 2024-01-02 ENCOUNTER — Encounter (HOSPITAL_COMMUNITY): Payer: Self-pay | Admitting: Urology

## 2024-01-02 DIAGNOSIS — N200 Calculus of kidney: Secondary | ICD-10-CM | POA: Diagnosis not present

## 2024-01-02 LAB — BASIC METABOLIC PANEL WITH GFR
Anion gap: 10 (ref 5–15)
BUN: 9 mg/dL (ref 6–20)
CO2: 26 mmol/L (ref 22–32)
Calcium: 7.7 mg/dL — ABNORMAL LOW (ref 8.9–10.3)
Chloride: 102 mmol/L (ref 98–111)
Creatinine, Ser: 0.8 mg/dL (ref 0.61–1.24)
GFR, Estimated: >60 mL/min (ref >60)
Glucose, Bld: 229 mg/dL — ABNORMAL HIGH (ref 70–99)
Potassium: 4 mmol/L (ref 3.5–5.1)
Sodium: 138 mmol/L (ref 135–145)

## 2024-01-02 LAB — CBC
HCT: 38.5 % — ABNORMAL LOW (ref 39.0–52.0)
Hemoglobin: 12.9 g/dL — ABNORMAL LOW (ref 13.0–17.0)
MCH: 30.2 pg (ref 26.0–34.0)
MCHC: 33.5 g/dL (ref 30.0–36.0)
MCV: 90.2 fL (ref 80.0–100.0)
Platelets: 217 K/uL (ref 150–400)
RBC: 4.27 MIL/uL (ref 4.22–5.81)
RDW: 13.2 % (ref 11.5–15.5)
WBC: 10.8 K/uL — ABNORMAL HIGH (ref 4.0–10.5)
nRBC: 0 % (ref 0.0–0.2)

## 2024-01-02 MED ORDER — KETOROLAC TROMETHAMINE 60 MG/2ML IM SOLN: 60.0000 mg | Freq: Once | INTRAMUSCULAR | Status: DC

## 2024-01-02 MED ORDER — KETOROLAC TROMETHAMINE 30 MG/ML IJ SOLN
60.0000 mg | Freq: Once | INTRAMUSCULAR | Status: AC
  Administered 2024-01-02: 60 mg via INTRAMUSCULAR
  Filled 2024-01-02: qty 2

## 2024-01-02 MED ORDER — OXYCODONE-ACETAMINOPHEN 5-325 MG PO TABS: 1.0000 | ORAL_TABLET | Freq: Four times a day (QID) | ORAL | 0 refills | Status: AC | PRN

## 2024-01-02 NOTE — Anesthesia Postprocedure Evaluation (Signed)
 Anesthesia Post Note  Patient: Chad Weaver.  Procedure(s) Performed: NEPHROLITHOTOMY PERCUTANEOUS (Left)  Patient location during evaluation: Phase II Anesthesia Type: General Level of consciousness: awake Pain management: pain level controlled Vital Signs Assessment: post-procedure vital signs reviewed and stable Respiratory status: spontaneous breathing and respiratory function stable Cardiovascular status: blood pressure returned to baseline and stable Postop Assessment: no headache and no apparent nausea or vomiting Anesthetic complications: no Comments: Late entry   No notable events documented.   Last Vitals:  Vitals:   01/02/24 0835 01/02/24 0917  BP:    Pulse: (!) 105   Resp: (!) 22 16  Temp: 36.9 C   SpO2: 94%     Last Pain:  Vitals:   01/02/24 0917  TempSrc:   PainSc: 10-Worst pain ever                 Yvonna JINNY Bosworth

## 2024-01-02 NOTE — Plan of Care (Signed)

## 2024-01-02 NOTE — Telephone Encounter (Signed)
 Medication prior authorization request received.  Completed PA request through cover my meds for drug oxycodone . KEY: [id number]  Approved: Pending

## 2024-01-02 NOTE — Discharge Instructions (Signed)

## 2024-01-02 NOTE — TOC CM/SW Note (Signed)
 Transition of Care [facility]) - Inpatient Brief Assessment   Patient Details  Name: Chad Weaver. MRN: [id number] Date of Birth: [birth year]  [facility]) CM/SW Contact:    Lucie Lunger, LCSWA Phone Number: 01/02/2024, 8:24 AM   Clinical Narrative: Transition of Care Department [facility]) has reviewed patient and no TOC needs have been identified at this time. We will continue to monitor patient advancement through interdiciplinary progression rounds. If new patient transition needs arise, please place a TOC consult.  Transition of Care Asessment: Insurance and Status: Insurance coverage has been reviewed Patient has primary care physician: No (PCP list added to AVS for review) Home environment has been reviewed: From home Prior level of function:: Independent Prior/Current Home Services: No current home services Social Drivers of Health Review: SDOH reviewed no interventions necessary Readmission risk has been reviewed: Yes Transition of care needs: no transition of care needs at this time

## 2024-01-02 NOTE — Progress Notes (Signed)
 Mobility Specialist Progress Note:    01/02/24 1043  Mobility  Activity Ambulated with assistance  Level of Assistance Modified independent, requires aide device or extra time  Assistive Device Front wheel walker  Distance Ambulated (ft) 240 ft  Range of Motion/Exercises Active;All extremities  Activity Response Tolerated well  Mobility Referral Yes  Mobility visit 1 Mobility  Mobility Specialist Start Time (ACUTE ONLY) 1043  Mobility Specialist Stop Time (ACUTE ONLY) 1100  Mobility Specialist Time Calculation (min) (ACUTE ONLY) 17 min   Pt received in bed, agreeable to mobility. MinA for bed mobility and ModI to stand and ambulate with no AD. Tolerated well, c/o back pain during supine to sit. Returned to room with RN and LPN. All needs met.  Jeremia Groot Mobility Specialist Please contact via Special Educational Needs Teacher or  Rehab office at [phone]

## 2024-01-03 ENCOUNTER — Emergency Department (HOSPITAL_COMMUNITY)
Admission: EM | Admit: 2024-01-03 | Discharge: 2024-01-03 | Disposition: A | Discharge status: Home / Self Care | Attending: Emergency Medicine | Admitting: Emergency Medicine

## 2024-01-03 DIAGNOSIS — T83092A Other mechanical complication of nephrostomy catheter, initial encounter: Secondary | ICD-10-CM | POA: Diagnosis present

## 2024-01-03 DIAGNOSIS — N99528 Other complication of other external stoma of urinary tract: Secondary | ICD-10-CM

## 2024-01-03 DIAGNOSIS — Z79899 Other long term (current) drug therapy: Secondary | ICD-10-CM | POA: Diagnosis not present

## 2024-01-03 NOTE — Discharge Instructions (Signed)
 Return if any problems.

## 2024-01-03 NOTE — ED Provider Notes (Signed)
 [place] EMERGENCY DEPARTMENT AT [facility] Provider Note   CSN: [id number] Arrival date & time: 01/03/24  1104     Patient presents with: nephrostomy tube not draining   Chad Weaver. is a 33 y.o. male.   Patient complains of nephrostomy tube not draining.  Patient had a nephrostomy tube placed on 1113.  He reports he has not had any drainage into the tube.  Patient spoke to the nurse on-call who advised him to come to the emergency department.  Patient reports he is not having any pain.  Patient denies any nausea or vomiting he is not having any chills patient denies any back or abdominal pain.  The history is provided by the patient and the spouse. No language interpreter was used.       Prior to Admission medications   Medication Sig Start Date End Date Taking? Authorizing Provider  allopurinol  (ZYLOPRIM ) 100 MG tablet Take 1 tablet (100 mg total) by mouth daily. 12/21/21   Stoneking, Adine PARAS., MD  clotrimazole -betamethasone  (LOTRISONE ) cream Apply 1 Application topically 2 (two) times daily. Patient not taking: No sig reported 03/13/22   Sherrilee Belvie CROME, MD  ketorolac  (TORADOL ) 10 MG tablet Take 1 tablet (10 mg total) by mouth every 6 (six) hours as needed. 12/31/23   McKenzie, Belvie CROME, MD  ondansetron  (ZOFRAN -ODT) 4 MG disintegrating tablet Take 1 tablet (4 mg total) by mouth every 8 (eight) hours as needed for nausea or vomiting. Patient not taking: No sig reported 06/30/23   Franklyn Sid SAILOR, MD  oxyCODONE  (OXY IR/ROXICODONE ) 5 MG immediate release tablet Take 1 tablet (5 mg total) by mouth every 4 (four) hours as needed for severe pain (pain score 7-10). Patient not taking: Reported on 12/30/2023 12/10/23   Idol, Julie, PA-C  oxyCODONE -acetaminophen  (PERCOCET/ROXICET) 5-325 MG tablet Take 1 tablet by mouth every 6 (six) hours as needed for severe pain (pain score 7-10). 01/02/24   McKenzie, Belvie CROME, MD  Potassium Citrate  15 MEQ (1620 MG) TBCR Take 1  tablet by mouth 2 (two) times daily. 10/28/23   Matilda Senior, MD  sodium bicarbonate  650 MG tablet Take 1 tablet (650 mg total) by mouth 2 (two) times daily. 10/28/23   Matilda Senior, MD  tamsulosin  (FLOMAX ) 0.4 MG CAPS capsule Take 1 capsule by mouth daily as needed for stone symptoms. Advised to contact urology provider / request office visit if stone symptoms fail to resolve. Go to ER if symptoms become severe. Patient not taking: Reported on 12/30/2023 08/29/23   Larocco, Sarah C, FNP    Allergies: Sulfonamide derivatives    Review of Systems  Gastrointestinal:  Negative for abdominal pain and nausea.  All other systems reviewed and are negative.   Updated Vital Signs BP 139/68 (BP Location: Right Arm)   Pulse (!) 101   Temp 98 F (36.7 C)   Resp 16   Ht 5' 11 (1.803 m)   Wt (!) 163.3 kg   SpO2 96%   BMI 50.21 kg/m   Physical Exam Vitals reviewed.  Constitutional:      Appearance: Normal appearance.  Cardiovascular:     Rate and Rhythm: Normal rate.  Pulmonary:     Effort: Pulmonary effort is normal.  Abdominal:     Comments: Nephrostomy tube in place, no sign of infection, no drainage   Musculoskeletal:        General: Normal range of motion.  Skin:    General: Skin is warm.  Neurological:  General: No focal deficit present.     Mental Status: He is alert.     (all labs ordered are listed, but only abnormal results are displayed) Labs Reviewed - No data to display  EKG: None  Radiology: No results found.   Procedures   Medications Ordered in the ED - No data to display                                  Medical Decision Making Pt complains of no urine draining from nephrostomy tube.  No pain  Amount and/or Complexity of Data Reviewed Independent Historian: spouse    Details: Pt is here with wife who is supportive Discussion of management or test interpretation with external provider(s): I discussed the patient with Dr. Christopher urology  on-call.  He advised to reassure patient and have patient keep his follow-up next week he states this means that patient no longer has a blockage and that urine is flowing to his bladder.  Risk Risk Details: Patient advised to follow-up with Dr. Sherrilee.  Return if any problems.        Final diagnoses:  Nephrostomy complication    ED Discharge Orders     None       An After Visit Summary was printed and given to the patient.    Flint Sonny POUR, PA-C 01/03/24 1416    Suzette Pac, MD 01/06/24 1014

## 2024-01-03 NOTE — ED Notes (Addendum)
 Catheter Flush attempted, not successful, not able to push saline into catheter, no drainage noted.

## 2024-01-03 NOTE — ED Triage Notes (Signed)
 Pt was d/c yesterday and a nephrostomy was placed. Pt states he has not had and drainage in the bag since he was d/c from hospital. Pt is concerned about the stitching. States stitching is red and swollen. A&Ox4.

## 2024-01-05 ENCOUNTER — Other Ambulatory Visit (HOSPITAL_BASED_OUTPATIENT_CLINIC_OR_DEPARTMENT_OTHER): Payer: Self-pay

## 2024-01-05 ENCOUNTER — Telehealth: Payer: Self-pay | Admitting: Urology

## 2024-01-05 NOTE — Telephone Encounter (Signed)
 Patient was seen at the ER on 01/03/2024.  Per ER note Dr. Alvaro informed patient to follow up on 11/19 as scheduled, no cause for concern at this time. I contacted patient to follow up on the phone call. Patients wife states patient is doing well, no fever and no new symptoms they will follow up with Dr. Sherrilee as scheduled.

## 2024-01-05 NOTE — Telephone Encounter (Signed)
 SABRA

## 2024-01-07 ENCOUNTER — Ambulatory Visit: Admitting: Urology

## 2024-01-07 VITALS — BP 139/85 | HR 83

## 2024-01-07 DIAGNOSIS — N200 Calculus of kidney: Secondary | ICD-10-CM

## 2024-01-10 NOTE — Discharge Summary (Signed)
 Physician Discharge Summary  Patient ID: Chad Weaver. MRN: [id number] DOB/AGE: Aug 05, 1990 33 y.o.  Admit date: 01/01/2024 Discharge date: 01/02/24  Admission Diagnoses:  Nephrolithiasis  Discharge Diagnoses:  Principal Problem:   Nephrolithiasis   Past Medical History:  Diagnosis Date   GERD (gastroesophageal reflux disease)    Gout    H/O knee surgery    History of kidney stones     Surgeries: Procedure(s): NEPHROLITHOTOMY PERCUTANEOUS on 01/01/2024   Consultants (if any):   Discharged Condition: Improved  Hospital Course: Chad Brobeck. is an 33 y.o. male who was admitted 01/01/2024 with a diagnosis of Nephrolithiasis and went to the operating room on 01/01/2024 and underwent the above named procedures.    He was given perioperative antibiotics:  Anti-infectives (From admission, onward)    Start     Dose/Rate Route Frequency Ordered Stop   01/01/24 0727  sodium chloride  0.9 % with cefTRIAXone  (ROCEPHIN ) ADS Med       Note to Pharmacy: Billy Sluder H: cabinet override      01/01/24 0727 01/01/24 1944     .  He was given sequential compression devices, early ambulation for DVT prophylaxis.  He benefited maximally from the hospital stay and there were no complications.    Inpatient Morphine  Milligram Equivalents Per Day 11/13 - 11/14   Values displayed are in units of MME/Day    Order Start / End Date 11/13 11/14    oxyCODONE  (Oxy IR/ROXICODONE ) immediate release tablet 5 mg 11/13 - 11/13 7.5 of Unknown --    oxyCODONE  (ROXICODONE ) 5 MG/5ML solution 5 mg 11/13 - 11/13 0 of Unknown --      Group total: 7.5 of Unknown     oxyCODONE  (Oxy IR/ROXICODONE ) 5 MG immediate release tablet 11/12 - 11/13 0 of Unknown --    fentaNYL  (SUBLIMAZE ) injection 25-50 mcg 11/13 - 11/13 45 of 45-90 --    fentaNYL  (SUBLIMAZE ) injection 11/13 - 11/13 *60 of 60 --    oxyCODONE  (Oxy IR/ROXICODONE ) immediate release tablet 5 mg 11/13 - 11/14 15 of 30 15 of 30    HYDROmorphone   (DILAUDID ) injection 0.5-1 mg 11/13 - 11/14 40 of 70-140 20 of 90-180    Daily Totals  * 167.5 of Unknown (at least 205-320) 35 of 120-210  *One-Step medication  Calculation Errors     Order Type Date Details   oxyCODONE  (Oxy IR/ROXICODONE ) 5 MG immediate release tablet Ordered Dose -- Frequency type could not be determined   oxyCODONE  (Oxy IR/ROXICODONE ) immediate release tablet 5 mg Ordered Dose -- Insufficient frequency information   oxyCODONE  (ROXICODONE ) 5 MG/5ML solution 5 mg Ordered Dose -- Insufficient frequency information            Recent vital signs:  Vitals:   01/02/24 0917 01/02/24 1319  BP:  131/66  Pulse:  100  Resp: 16 18  Temp:  98.7 F (37.1 C)  SpO2:  95%    Recent laboratory studies:  Lab Results  Component Value Date   HGB 12.9 (L) 01/02/2024   HGB 13.9 01/01/2024   HGB 15.1 12/31/2023   Lab Results  Component Value Date   WBC 10.8 (H) 01/02/2024   PLT 217 01/02/2024   Lab Results  Component Value Date   INR 0.9 12/31/2023   Lab Results  Component Value Date   NA 138 01/02/2024   K 4.0 01/02/2024   CL 102 01/02/2024   CO2 26 01/02/2024   BUN 9 01/02/2024   CREATININE 0.80  01/02/2024   GLUCOSE 229 (H) 01/02/2024    Discharge Medications:   Allergies as of 01/02/2024       Reactions   Sulfonamide Derivatives Swelling, Rash        Medication List     TAKE these medications    allopurinol  100 MG tablet Commonly known as: Zyloprim  Take 1 tablet (100 mg total) by mouth daily.   clotrimazole -betamethasone  cream Commonly known as: Lotrisone  Apply 1 Application topically 2 (two) times daily.   ketorolac  10 MG tablet Commonly known as: TORADOL  Take 1 tablet (10 mg total) by mouth every 6 (six) hours as needed.   ondansetron  4 MG disintegrating tablet Commonly known as: ZOFRAN -ODT Take 1 tablet (4 mg total) by mouth every 8 (eight) hours as needed for nausea or vomiting.   oxyCODONE  5 MG immediate release tablet Commonly  known as: Oxy IR/ROXICODONE  Take 1 tablet (5 mg total) by mouth every 4 (four) hours as needed for severe pain (pain score 7-10).   oxyCODONE -acetaminophen  5-325 MG tablet Commonly known as: PERCOCET/ROXICET Take 1 tablet by mouth every 6 (six) hours as needed for severe pain (pain score 7-10).   Potassium Citrate  15 MEQ (1620 MG) Tbcr Take 1 tablet by mouth 2 (two) times daily.   sodium bicarbonate  650 MG tablet Take 1 tablet (650 mg total) by mouth 2 (two) times daily.   tamsulosin  0.4 MG Caps capsule Commonly known as: FLOMAX  Take 1 capsule by mouth daily as needed for stone symptoms. Advised to contact urology provider / request office visit if stone symptoms fail to resolve. Go to ER if symptoms become severe.        Diagnostic Studies: DG C-Arm 1-60 Min-No Report Result Date: 01/01/2024 Fluoroscopy was utilized by the requesting physician.  No radiographic interpretation.   IR NEPHROSTOMY PLACEMENT LEFT Result Date: 12/31/2023 INDICATION: Left staghorn calculus EXAM: ULTRASOUND FLUOROSCOPIC LEFT NEPHROSTOMY ACCESS FOR PERCUTANEOUS OPERATIVE NEPHROLITHOTOMY COMPARISON:  12/10/2023 MEDICATIONS: 2 G ROCEPHIN ; The antibiotic was administered in an appropriate time frame prior to skin puncture. ANESTHESIA/SEDATION: Moderate (conscious) sedation was employed during this procedure. A total of Versed  6.0 mg and Fentanyl  200 mcg was administered intravenously by the radiology nurse. 50 mg Benadryl  IV as well Total intra-service moderate Sedation Time: 19 minutes. The patient's level of consciousness and vital signs were monitored continuously by radiology nursing throughout the procedure under my direct supervision. CONTRAST:  15 cc omni 300-administered into the collecting system(s) FLUOROSCOPY: Radiation Exposure Index (as provided by the fluoroscopic device): 151 mGy Kerma COMPLICATIONS: None immediate. PROCEDURE: Informed written consent was obtained from the patient after a thorough  discussion of the procedural risks, benefits and alternatives. All questions were addressed. Maximal Sterile Barrier Technique was utilized including caps, mask, sterile gowns, sterile gloves, sterile drape, hand hygiene and skin antiseptic. A timeout was performed prior to the initiation of the procedure. Previous imaging reviewed. Preliminary ultrasound performed. Staghorn calculus was marked for access. Under sterile conditions and local anesthesia, ultrasound percutaneous needle access performed with an 18 gauge 15 cm needle into a lower pole calyx containing a portion of the stone. Stone was felt on the needle tip. Gentle contrast injection confirms position within the calyx adjacent to the stone. Images obtained for documentation. Bentson guidewire inserted. Kumpe catheter advanced into the renal pelvis. Guidewire exchanged for an Amplatz guidewire which was advanced into the ureter. Catheter and guidewire access advanced inferiorly into the bladder. Contrast injection confirms position in the bladder. Images obtained for documentation. Patient tolerated the  procedure well. No immediate complication. IMPRESSION: Successful ultrasound and fluoroscopic left nephrostomy access for operative percutaneous nephrolithotomy to be performed tomorrow at [facility]. Electronically Signed   By: CHRISTELLA.  Shick M.D.   On: 12/31/2023 13:06   DG Abd 1 View Result Date: 12/12/2023 CLINICAL DATA:  Nephrolithiasis. EXAM: DG ABDOMEN 1V COMPARISON:  CT yesterday FINDINGS: The known left renal calculi are not well demonstrated, the largest stone is potentially visualized at the L2 level measuring 2.8 cm. Soft tissue attenuation from habitus limits detailed assessment. Moderate to large colonic stool burden. No bowel obstruction IMPRESSION: Known left renal calculi are not well demonstrated, the largest stone is potentially visualized at the L2 level measuring 2.8 cm. Electronically Signed   By: Andrea Gasman M.D.   On:  12/12/2023 23:07    Disposition: Discharge disposition: 01-Home or Self Care          Follow-up Information     Terion Hedman, Belvie CROME, MD. Call on 01/07/2024.   Specialty: Urology Contact information: [address]  [place] [address] [phone]                  Signed: Belvie Clara 01/10/2024, 9:31 AM

## 2024-01-11 NOTE — Patient Instructions (Signed)

## 2024-01-11 NOTE — Progress Notes (Signed)
 01/07/2024 12:44 PM   Chad Weaver. 05/15/1990 [id number]  Referring provider: No referring provider defined for this encounter.  Followup after PCNL   HPI: Chad Weaver is a 33yo here for followp after left PCNL. No fevers. No significant flank pain.    PMH: Past Medical History:  Diagnosis Date   GERD (gastroesophageal reflux disease)    Gout    H/O knee surgery    History of kidney stones     Surgical History: Past Surgical History:  Procedure Laterality Date   ADENOIDECTOMY     ANTERIOR CRUCIATE LIGAMENT REPAIR     ANTERIOR CRUCIATE LIGAMENT REPAIR Left    CYSTOSCOPY W/ URETERAL STENT PLACEMENT Left 01/17/2022   Procedure: CYSTOSCOPY WITH RETROGRADE PYELOGRAM/URETERAL STENT PLACEMENT;  Surgeon: Roseann Adine PARAS., MD;  Location: [facility];  Service: Urology;  Laterality: Left;   CYSTOSCOPY WITH RETROGRADE PYELOGRAM, URETEROSCOPY AND STENT PLACEMENT Bilateral 01/17/2022   Procedure: CYSTOSCOPY WITH BILATERAL RETROGRADE PYELOGRAM;  Surgeon: Roseann Adine PARAS., MD;  Location: [facility];  Service: Urology;  Laterality: Bilateral;   CYSTOSCOPY WITH RETROGRADE PYELOGRAM, URETEROSCOPY AND STENT PLACEMENT Left 03/07/2022   Procedure: CYSTOSCOPY WITH RETROGRADE PYELOGRAM, URETEROSCOPY AND STENT PLACEMENT;  Surgeon: Sherrilee Belvie CROME, MD;  Location: [facility];  Service: Urology;  Laterality: Left;   HOLMIUM LASER APPLICATION Left 03/07/2022   Procedure: HOLMIUM LASER APPLICATION;  Surgeon: Sherrilee Belvie CROME, MD;  Location: [facility];  Service: Urology;  Laterality: Left;   IR NEPHROSTOMY PLACEMENT LEFT  12/31/2023   NEPHROLITHOTOMY Left 01/01/2024   Procedure: NEPHROLITHOTOMY PERCUTANEOUS;  Surgeon: Sherrilee Belvie CROME, MD;  Location: [facility];  Service: Urology;  Laterality: Left;   STONE EXTRACTION WITH BASKET Left 03/07/2022   Procedure: STONE EXTRACTION WITH BASKET;  Surgeon: Sherrilee Belvie CROME, MD;  Location: [facility];  Service: Urology;  Laterality: Left;   TYMPANOSTOMY TUBE  PLACEMENT      Home Medications:  Allergies as of 01/07/2024       Reactions   Sulfonamide Derivatives Swelling, Rash        Medication List        Accurate as of January 07, 2024 11:59 PM. If you have any questions, ask your nurse or doctor.          allopurinol  100 MG tablet Commonly known as: Zyloprim  Take 1 tablet (100 mg total) by mouth daily.   clotrimazole -betamethasone  cream Commonly known as: Lotrisone  Apply 1 Application topically 2 (two) times daily.   ketorolac  10 MG tablet Commonly known as: TORADOL  Take 1 tablet (10 mg total) by mouth every 6 (six) hours as needed.   ondansetron  4 MG disintegrating tablet Commonly known as: ZOFRAN -ODT Take 1 tablet (4 mg total) by mouth every 8 (eight) hours as needed for nausea or vomiting.   oxyCODONE  5 MG immediate release tablet Commonly known as: Oxy IR/ROXICODONE  Take 1 tablet (5 mg total) by mouth every 4 (four) hours as needed for severe pain (pain score 7-10).   oxyCODONE -acetaminophen  5-325 MG tablet Commonly known as: PERCOCET/ROXICET Take 1 tablet by mouth every 6 (six) hours as needed for severe pain (pain score 7-10).   Potassium Citrate  15 MEQ (1620 MG) Tbcr Take 1 tablet by mouth 2 (two) times daily.   sodium bicarbonate  650 MG tablet Take 1 tablet (650 mg total) by mouth 2 (two) times daily.   tamsulosin  0.4 MG Caps capsule Commonly known as: FLOMAX  Take 1 capsule by mouth daily as needed for stone symptoms. Advised to contact urology provider /  request office visit if stone symptoms fail to resolve. Go to ER if symptoms become severe.        Allergies:  Allergies  Allergen Reactions   Sulfonamide Derivatives Swelling and Rash    Family History: No family history on file.  Social History:  reports that he has never smoked. He has never been exposed to tobacco smoke. His smokeless tobacco use includes snuff. He reports that he does not drink alcohol and does not use  drugs.  ROS: All other review of systems were reviewed and are negative except what is noted above in HPI  Physical Exam: BP 139/85   Pulse 83   Constitutional:  Alert and oriented, No acute distress. HEENT: [place] AT, moist mucus membranes.  Trachea midline, no masses. Cardiovascular: No clubbing, cyanosis, or edema. Respiratory: Normal respiratory effort, no increased work of breathing. GI: Abdomen is soft, nontender, nondistended, no abdominal masses GU: No CVA tenderness.  Lymph: No cervical or inguinal lymphadenopathy. Skin: No rashes, bruises or suspicious lesions. Neurologic: Grossly intact, no focal deficits, moving all 4 extremities. Psychiatric: Normal mood and affect.  Laboratory Data: Lab Results  Component Value Date   WBC 10.8 (H) 01/02/2024   HGB 12.9 (L) 01/02/2024   HCT 38.5 (L) 01/02/2024   MCV 90.2 01/02/2024   PLT 217 01/02/2024    Lab Results  Component Value Date   CREATININE 0.80 01/02/2024    No results found for: PSA  No results found for: TESTOSTERONE  Lab Results  Component Value Date   HGBA1C 9.5 (H) 01/16/2022    Urinalysis    Component Value Date/Time   COLORURINE STRAW (A) 12/10/2023 1624   APPEARANCEUR Clear 12/23/2023 1110   LABSPEC 1.010 12/10/2023 1624   PHURINE 6.0 12/10/2023 1624   GLUCOSEU 1+ (A) 12/23/2023 1110   HGBUR MODERATE (A) 12/10/2023 1624   BILIRUBINUR Negative 12/23/2023 1110   KETONESUR NEGATIVE 12/10/2023 1624   PROTEINUR Negative 12/23/2023 1110   PROTEINUR NEGATIVE 12/10/2023 1624   UROBILINOGEN 0.2 11/24/2013 1715   NITRITE Negative 12/23/2023 1110   NITRITE NEGATIVE 12/10/2023 1624   LEUKOCYTESUR Trace (A) 12/23/2023 1110   LEUKOCYTESUR TRACE (A) 12/10/2023 1624    Lab Results  Component Value Date   LABMICR See below: 12/23/2023   WBCUA 6-10 (A) 12/23/2023   LABEPIT 0-10 12/23/2023   MUCUS Present (A) 12/23/2023   BACTERIA None seen 12/23/2023    Pertinent Imaging:  Results for orders  placed in visit on 12/11/23  DG Abd 1 View  Narrative CLINICAL DATA:  Nephrolithiasis.  EXAM: DG ABDOMEN 1V  COMPARISON:  CT yesterday  FINDINGS: The known left renal calculi are not well demonstrated, the largest stone is potentially visualized at the L2 level measuring 2.8 cm. Soft tissue attenuation from habitus limits detailed assessment. Moderate to large colonic stool burden. No bowel obstruction  IMPRESSION: Known left renal calculi are not well demonstrated, the largest stone is potentially visualized at the L2 level measuring 2.8 cm.   Electronically Signed By: Andrea Gasman M.D. On: 12/12/2023 23:07  No results found for this or any previous visit.  No results found for this or any previous visit.  No results found for this or any previous visit.  Results for orders placed during the hospital encounter of 08/01/23  Ultrasound renal complete  Narrative CLINICAL DATA:  Nephrolithiasis follow-up in a patient with left flank pain. 02/25/2022 left stone extraction.  EXAM: RENAL / URINARY TRACT ULTRASOUND COMPLETE  COMPARISON:  Ultrasound renal 09/25/2022  FINDINGS: Right Kidney:  Renal measurements: Length 12.5 cm height 5.7 cm with 7.6 cm = volume: 284.7 mL. Echogenicity within normal limits. No mass or hydronephrosis visualized.  Left Kidney:  Renal measurements: Length 13.3 cm height 6.4 cm with 6.2 cm = volume: 272.6 mL. Echogenic material in the lower pole consistent with calculi and posterior shadowing with the largest stone measuring 25.4 mm. No mass or hydronephrosis visualized.  Bladder:  Appears normal for degree of bladder distention.  Other:  None.  IMPRESSION: Left-sided lower pole renal stone largest measuring 25.4 mm   Electronically Signed By: Cordella Banner On: 08/04/2023 09:00  No results found for this or any previous visit.  No results found for this or any previous visit.  Results for orders placed during  the hospital encounter of 12/10/23  CT Renal Stone Study  Narrative CLINICAL DATA:  Left flank pain since Friday, hematuria  EXAM: CT ABDOMEN AND PELVIS WITHOUT CONTRAST  TECHNIQUE: Multidetector CT imaging of the abdomen and pelvis was performed following the standard protocol without IV contrast.  RADIATION DOSE REDUCTION: This exam was performed according to the departmental dose-optimization program which includes automated exposure control, adjustment of the mA and/or kV according to patient size and/or use of iterative reconstruction technique.  COMPARISON:  09/26/2023  FINDINGS: Lower chest: No acute pleural or parenchymal lung disease.  Hepatobiliary: Stable hepatic steatosis with areas of fatty sparing near the gallbladder fossa. Gallbladder is unremarkable. No biliary duct dilation.  Pancreas: Unremarkable unenhanced appearance.  Spleen: Unremarkable unenhanced appearance.  Adrenals/Urinary Tract: Multiple nonobstructing left renal calculi are again identified, largest measuring up to 27 mm. Since the prior exam, mild left hydronephrosis and hydroureter as developed. No evidence of obstructing radiopaque calculus. Findings could reflect obstruction from downstream blood clot given reported history of hematuria. Further evaluation with cystoscopy and ureteroscopy could be considered.  The right kidney is unremarkable. The adrenals and bladder appear normal.  Stomach/Bowel: No bowel obstruction or ileus. No bowel wall thickening or inflammatory change.  Vascular/Lymphatic: No significant vascular findings are present. No enlarged abdominal or pelvic lymph nodes.  Reproductive: Prostate is unremarkable.  Other: No free fluid or free intraperitoneal gas. No abdominal wall hernia.  Musculoskeletal: No acute or destructive bony abnormalities. Reconstructed images demonstrate no additional findings.  IMPRESSION: 1. Multiple nonobstructing left renal  calculi, not appreciably changed in size or position since prior study. 2. Interval development of mild left-sided hydronephrosis and hydroureter, with no evidence of obstructing downstream radiopaque calculus. Findings could reflect obstruction from blood clot given history of hematuria. If further evaluation is desired, cystoscopy/ureteroscopy could be considered. 3. Stable hepatic steatosis.   Electronically Signed By: Ozell Daring M.D. On: 12/10/2023 17:23   Assessment & Plan:    1. Nephrolithiasis (Primary) Nephrostomy tube removed today. He will followup in 1 week for stent removal - Urinalysis, Routine w reflex microscopic; Future   No follow-ups on file.  Belvie Clara, MD  [facility] [place]

## 2024-01-12 LAB — STONE ANALYSIS
Uric Acid Calculi: 100 %
Weight Calculi: 6074 mg

## 2024-01-12 IMAGING — CT CT RENAL STONE PROTOCOL
2 of 4 series · 16 of 46 positions shown, 18 images · non-contrast
Comparison: 08/19/2018

CLINICAL DATA: Left-sided flank pain for 1 day



[Series 2: axial st · axial · 0.98mm/px · z∈[+920,+1420]mm · 13 of 112 slices shown, 15 images]
[im 6/112  soft-tissue]
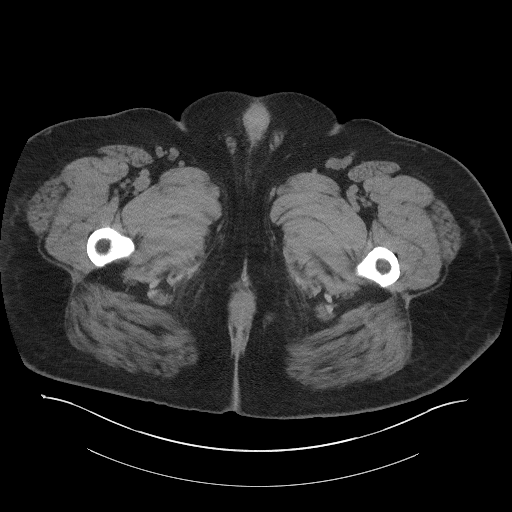
[im 6/112  bone]
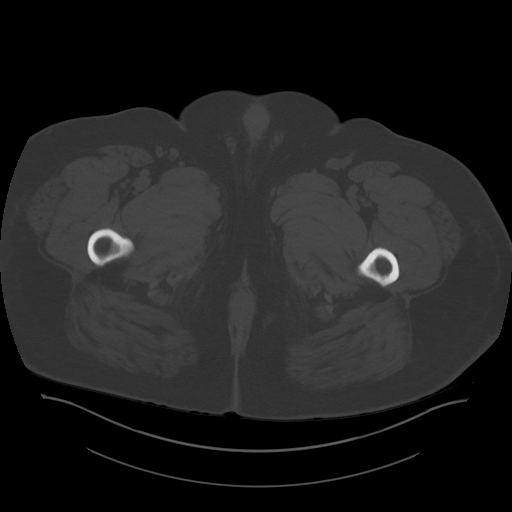
[im 17/112  soft-tissue]
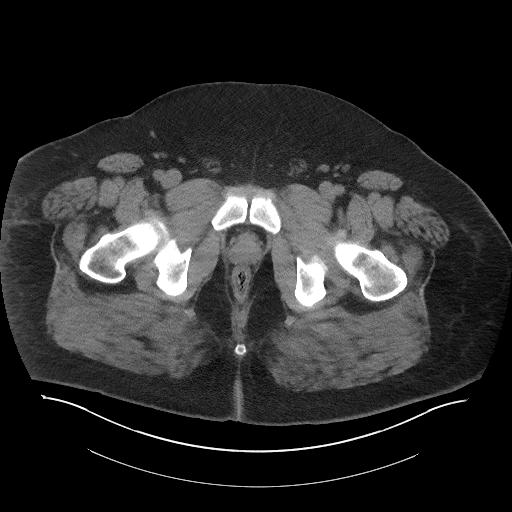
[im 23/112  soft-tissue]
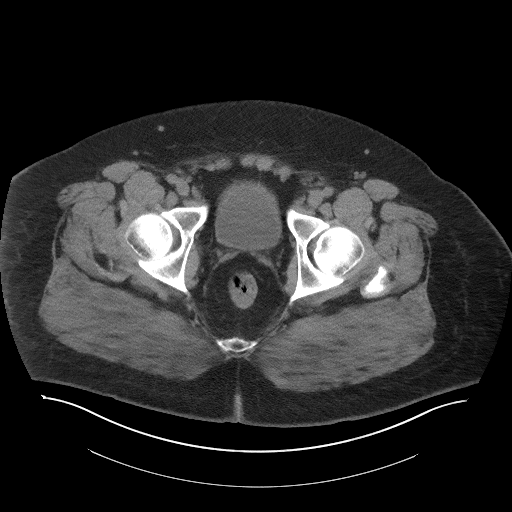
[im 34/112  soft-tissue]
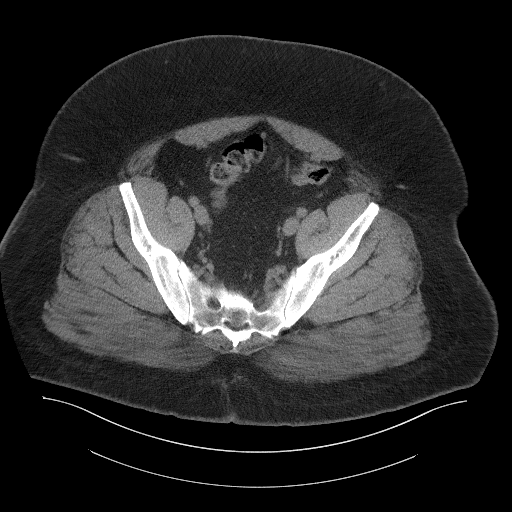
[im 39/112  soft-tissue]
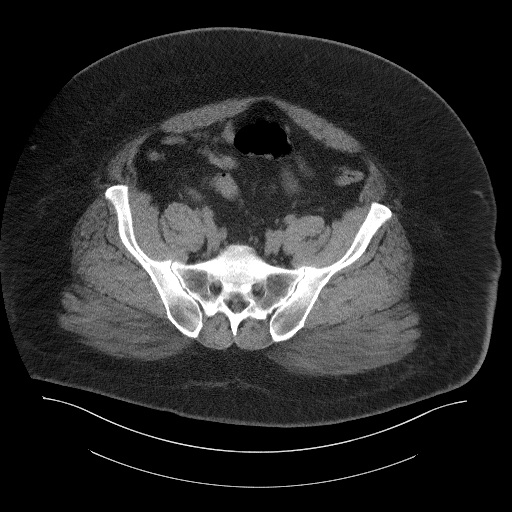
[im 50/112  soft-tissue]
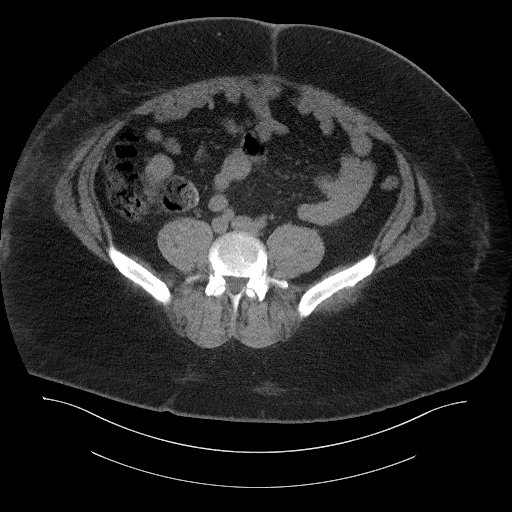
[im 56/112  soft-tissue]
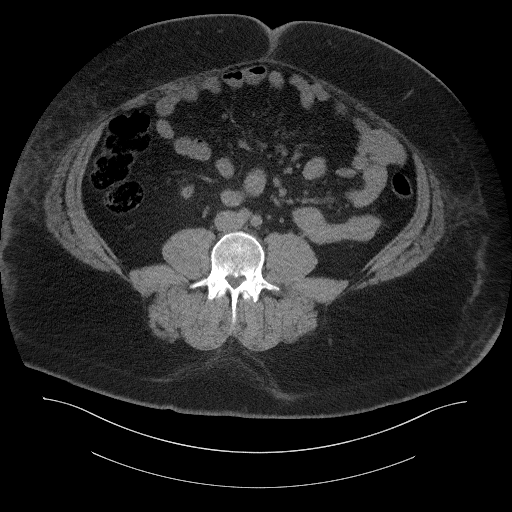
[im 62/112  soft-tissue]
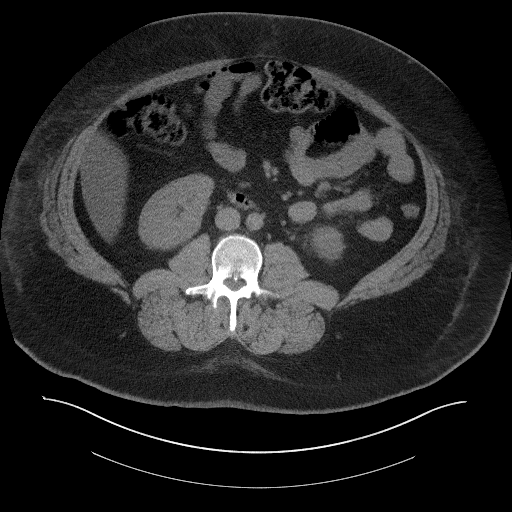
[im 73/112  soft-tissue]
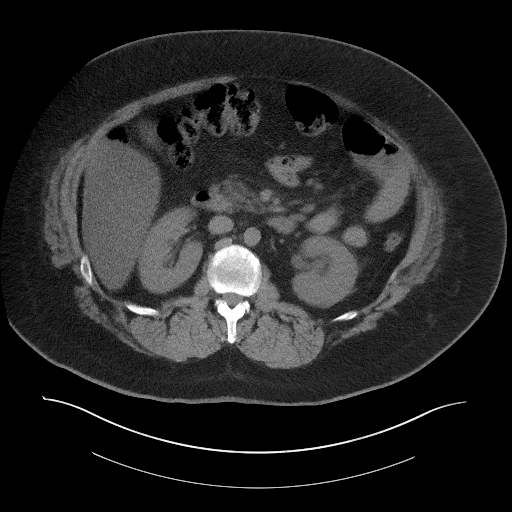
[im 73/112  bone]
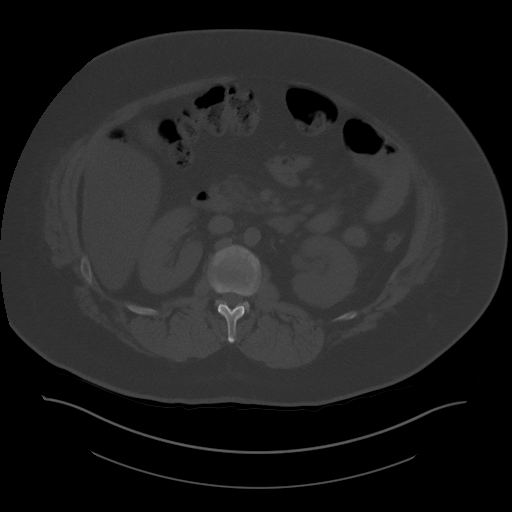
[im 78/112  soft-tissue]
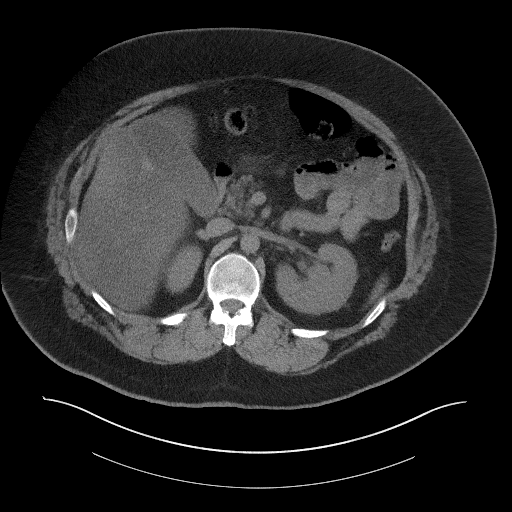
[im 89/112  soft-tissue]
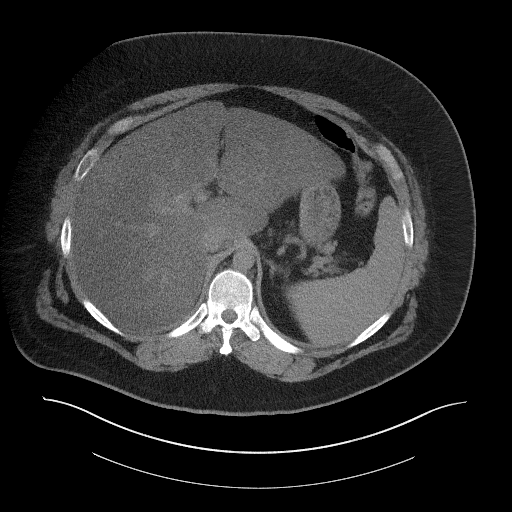
[im 95/112  soft-tissue]
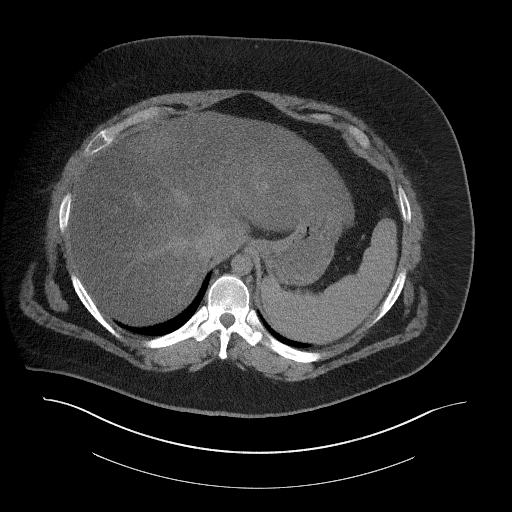
[im 106/112  soft-tissue]
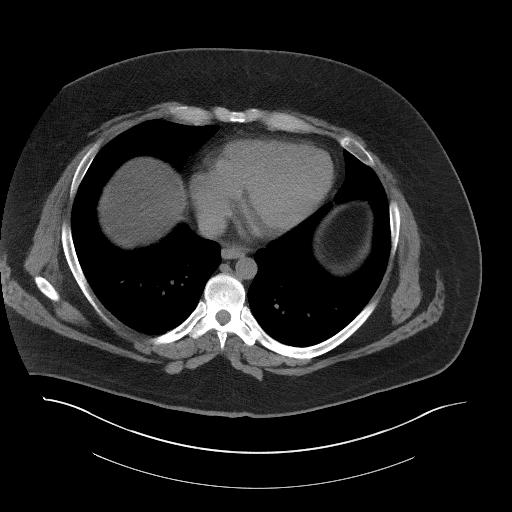

[Series 5: coronal st · coronal · 0.94mm/px · 3 of 137 slices shown]
[im 46/137  soft-tissue]
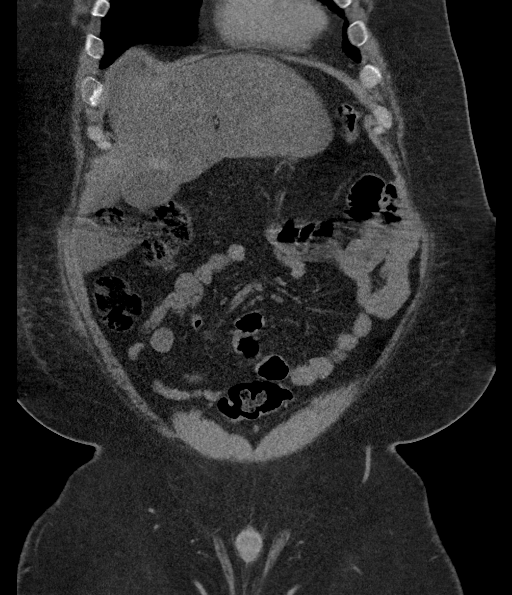
[im 61/137  soft-tissue]
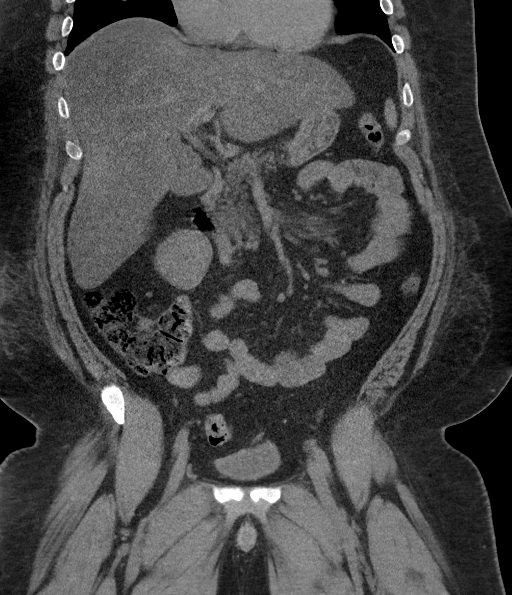
[im 76/137  soft-tissue]
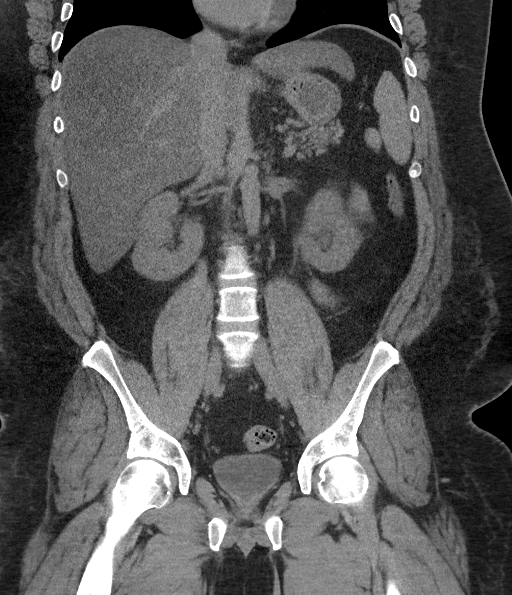

[16 of 46 positions shown; findings below may reference images not displayed]

FINDINGS: Lower chest: No acute pleural or parenchymal lung disease.

Hepatobiliary: Diffuse hepatic steatosis with focal fatty sparing at
the gallbladder fossa. Otherwise unremarkable unenhanced appearance
of the liver and gallbladder.

Pancreas: Unremarkable unenhanced appearance.

Spleen: Unremarkable unenhanced appearance.

Adrenals/Urinary Tract: There is mild to moderate left-sided
hydronephrosis due to a 2 mm left UPJ calculus, reference image
48/2. There is a second nonobstructing calculus within the left
renal pelvis, measuring 5 mm.

The right kidney is unremarkable. The adrenals and bladder are
grossly normal.

Stomach/Bowel: No bowel obstruction or ileus. The appendix is not
identified, and may be surgically absent. No bowel wall thickening
or inflammatory change.

Vascular/Lymphatic: No significant vascular findings are present. No
enlarged abdominal or pelvic lymph nodes.

Reproductive: Prostate is unremarkable.

Other: No free fluid or free intraperitoneal gas. No abdominal wall
hernia.

Musculoskeletal: No acute or destructive bony lesions. Reconstructed
images demonstrate no additional findings.
IMPRESSION: 1. Mild left-sided obstructive uropathy due to a 2 mm obstructing
left UPJ calculus.
2. Additional nonobstructing 5 mm left renal calculus.
3. Hepatic steatosis, with focal fatty sparing at the gallbladder
fossa.

## 2024-01-14 ENCOUNTER — Ambulatory Visit (INDEPENDENT_AMBULATORY_CARE_PROVIDER_SITE_OTHER): Admitting: Urology

## 2024-01-14 VITALS — BP 121/85 | HR 98

## 2024-01-14 DIAGNOSIS — N200 Calculus of kidney: Secondary | ICD-10-CM | POA: Diagnosis not present

## 2024-01-14 LAB — URINALYSIS, ROUTINE W REFLEX MICROSCOPIC
Bilirubin, UA: NEGATIVE
Glucose, UA: NEGATIVE
Ketones, UA: NEGATIVE
Nitrite, UA: NEGATIVE
Specific Gravity, UA: 1.025 (ref 1.005–1.030)
Urobilinogen, Ur: 0.2 mg/dL (ref 0.2–1.0)
pH, UA: 6 (ref 5.0–7.5)

## 2024-01-14 LAB — MICROSCOPIC EXAMINATION
RBC, Urine: >30 /HPF — AB (ref 0–2)
WBC, UA: >30 /HPF — AB (ref 0–5)

## 2024-01-14 MED ORDER — CIPROFLOXACIN HCL 500 MG PO TABS
500.0000 mg | ORAL_TABLET | Freq: Once | ORAL | Status: AC
  Administered 2024-01-14: 500 mg via ORAL

## 2024-01-14 NOTE — Progress Notes (Signed)
   01/14/24  CC: followup nephrolithiasis   HPI: Mr Keisling is a 33yo here for stent removal Blood pressure 121/85, pulse 98. NED. A&Ox3.   No respiratory distress   Abd soft, NT, ND Normal phallus with bilateral descended testicles  Cystoscopy Procedure Note  Patient identification was confirmed, informed consent was obtained, and patient was prepped using Betadine solution.  Lidocaine  jelly was administered per urethral meatus.     Pre-Procedure: - Inspection reveals a normal caliber ureteral meatus.  Procedure: The flexible cystoscope was introduced without difficulty - No urethral strictures/lesions are present. - Normal prostate  - Normal bladder neck - Bilateral ureteral orifices identified - Bladder mucosa  reveals no ulcers, tumors, or lesions - No bladder stones - No trabeculation  Using a grasper the left ureteral stent was removed intact   Post-Procedure: - Patient tolerated the procedure well  Assessment/ Plan: Followup 3 months with a renal US   No follow-ups on file.  Belvie Clara, MD

## 2024-01-14 NOTE — Patient Instructions (Signed)

## 2024-02-20 ENCOUNTER — Ambulatory Visit: Admitting: Urology

## 2024-04-09 ENCOUNTER — Ambulatory Visit: Admitting: Urology
